# Patient Record
Sex: Male | Born: 1945 | Race: White | Hispanic: No | Marital: Married | Smoking: Never smoker
Health system: Southern US, Community
[De-identification: ages and names within clinical notes are randomized; demographics above are authoritative.]

## PROBLEM LIST (undated history)

## (undated) DIAGNOSIS — N289 Disorder of kidney and ureter, unspecified: Secondary | ICD-10-CM

## (undated) DIAGNOSIS — I10 Essential (primary) hypertension: Secondary | ICD-10-CM

## (undated) DIAGNOSIS — L97509 Non-pressure chronic ulcer of other part of unspecified foot with unspecified severity: Secondary | ICD-10-CM

## (undated) DIAGNOSIS — I739 Peripheral vascular disease, unspecified: Secondary | ICD-10-CM

## (undated) DIAGNOSIS — E13621 Other specified diabetes mellitus with foot ulcer: Secondary | ICD-10-CM

## (undated) DIAGNOSIS — I639 Cerebral infarction, unspecified: Secondary | ICD-10-CM

## (undated) DIAGNOSIS — E781 Pure hyperglyceridemia: Secondary | ICD-10-CM

## (undated) DIAGNOSIS — I82409 Acute embolism and thrombosis of unspecified deep veins of unspecified lower extremity: Secondary | ICD-10-CM

## (undated) HISTORY — PX: TOE AMPUTATION: SHX809

## (undated) HISTORY — DX: Non-pressure chronic ulcer of other part of unspecified foot with unspecified severity: L97.509

## (undated) HISTORY — DX: Non-pressure chronic ulcer of other part of unspecified foot with unspecified severity: E13.621

## (undated) HISTORY — PX: HERNIA REPAIR: SHX51

## (undated) HISTORY — DX: Peripheral vascular disease, unspecified: I73.9

## (undated) HISTORY — DX: Acute embolism and thrombosis of unspecified deep veins of unspecified lower extremity: I82.409

## (undated) HISTORY — PX: ENDARTERECTOMY: SHX5162

## (undated) HISTORY — PX: TONSILLECTOMY: SUR1361

---

## 2005-12-17 ENCOUNTER — Inpatient Hospital Stay (HOSPITAL_COMMUNITY)
Admission: RE | Admit: 2005-12-17 | Discharge: 2006-01-06 | Payer: Self-pay | Admitting: Physical Medicine & Rehabilitation

## 2005-12-17 ENCOUNTER — Ambulatory Visit: Payer: Self-pay | Admitting: Physical Medicine & Rehabilitation

## 2006-06-14 DIAGNOSIS — I639 Cerebral infarction, unspecified: Secondary | ICD-10-CM

## 2006-06-14 HISTORY — DX: Cerebral infarction, unspecified: I63.9

## 2007-06-15 HISTORY — PX: COLONOSCOPY: SHX174

## 2011-02-23 ENCOUNTER — Encounter: Payer: Self-pay | Admitting: *Deleted

## 2011-02-23 ENCOUNTER — Observation Stay (HOSPITAL_COMMUNITY)
Admission: EM | Admit: 2011-02-23 | Discharge: 2011-02-25 | DRG: 832 | Disposition: A | Discharge status: Home Health | Payer: BC Managed Care – PPO | Attending: Internal Medicine | Admitting: Internal Medicine

## 2011-02-23 ENCOUNTER — Inpatient Hospital Stay (HOSPITAL_COMMUNITY): Payer: BC Managed Care – PPO

## 2011-02-23 ENCOUNTER — Emergency Department (HOSPITAL_COMMUNITY): Payer: BC Managed Care – PPO

## 2011-02-23 ENCOUNTER — Other Ambulatory Visit: Payer: Self-pay

## 2011-02-23 DIAGNOSIS — IMO0001 Reserved for inherently not codable concepts without codable children: Secondary | ICD-10-CM | POA: Diagnosis present

## 2011-02-23 DIAGNOSIS — G459 Transient cerebral ischemic attack, unspecified: Principal | ICD-10-CM | POA: Diagnosis present

## 2011-02-23 DIAGNOSIS — L02419 Cutaneous abscess of limb, unspecified: Secondary | ICD-10-CM | POA: Diagnosis present

## 2011-02-23 DIAGNOSIS — E119 Type 2 diabetes mellitus without complications: Secondary | ICD-10-CM | POA: Diagnosis present

## 2011-02-23 DIAGNOSIS — L0390 Cellulitis, unspecified: Secondary | ICD-10-CM | POA: Diagnosis present

## 2011-02-23 DIAGNOSIS — E785 Hyperlipidemia, unspecified: Secondary | ICD-10-CM | POA: Diagnosis present

## 2011-02-23 DIAGNOSIS — R29898 Other symptoms and signs involving the musculoskeletal system: Secondary | ICD-10-CM | POA: Insufficient documentation

## 2011-02-23 DIAGNOSIS — L03119 Cellulitis of unspecified part of limb: Secondary | ICD-10-CM | POA: Diagnosis present

## 2011-02-23 DIAGNOSIS — E1159 Type 2 diabetes mellitus with other circulatory complications: Secondary | ICD-10-CM | POA: Diagnosis present

## 2011-02-23 DIAGNOSIS — I69959 Hemiplegia and hemiparesis following unspecified cerebrovascular disease affecting unspecified side: Secondary | ICD-10-CM

## 2011-02-23 DIAGNOSIS — I10 Essential (primary) hypertension: Secondary | ICD-10-CM

## 2011-02-23 DIAGNOSIS — I639 Cerebral infarction, unspecified: Secondary | ICD-10-CM | POA: Diagnosis present

## 2011-02-23 HISTORY — DX: Essential (primary) hypertension: I10

## 2011-02-23 HISTORY — DX: Cerebral infarction, unspecified: I63.9

## 2011-02-23 HISTORY — DX: Pure hyperglyceridemia: E78.1

## 2011-02-23 HISTORY — DX: Disorder of kidney and ureter, unspecified: N28.9

## 2011-02-23 LAB — DIFFERENTIAL: Monocytes Absolute: 1.5 10*3/uL — ABNORMAL HIGH (ref 0.1–1.0)

## 2011-02-23 LAB — GLUCOSE, CAPILLARY: Glucose-Capillary: 252 mg/dL — ABNORMAL HIGH (ref 70–99)

## 2011-02-23 LAB — CBC
Hemoglobin: 15 g/dL (ref 13.0–17.0)
MCH: 29.8 pg (ref 26.0–34.0)
MCV: 85.7 fL (ref 78.0–100.0)

## 2011-02-23 LAB — BASIC METABOLIC PANEL
Calcium: 9.9 mg/dL (ref 8.4–10.5)
Creatinine, Ser: 0.87 mg/dL (ref 0.50–1.35)

## 2011-02-23 MED ORDER — ONDANSETRON HCL 4 MG/2ML IJ SOLN: 4.0000 mg | Freq: Four times a day (QID) | INTRAMUSCULAR | Status: DC | PRN

## 2011-02-23 MED ORDER — POTASSIUM CHLORIDE IN NACL 20-0.9 MEQ/L-% IV SOLN
INTRAVENOUS | Status: DC
  Administered 2011-02-23: 19:00:00 via INTRAVENOUS

## 2011-02-23 MED ORDER — LOSARTAN POTASSIUM 50 MG PO TABS
25.0000 mg | ORAL_TABLET | Freq: Every day | ORAL | Status: DC
  Administered 2011-02-23 – 2011-02-25 (×3): 25 mg via ORAL
  Filled 2011-02-23: qty 1
  Filled 2011-02-23: qty 2
  Filled 2011-02-23: qty 1

## 2011-02-23 MED ORDER — GLIPIZIDE 5 MG PO TABS
20.0000 mg | ORAL_TABLET | Freq: Two times a day (BID) | ORAL | Status: DC
  Administered 2011-02-24 – 2011-02-25 (×3): 20 mg via ORAL
  Filled 2011-02-23 (×3): qty 4

## 2011-02-23 MED ORDER — GEMFIBROZIL 600 MG PO TABS
600.0000 mg | ORAL_TABLET | Freq: Two times a day (BID) | ORAL | Status: DC
  Administered 2011-02-24 – 2011-02-25 (×3): 600 mg via ORAL
  Filled 2011-02-23 (×3): qty 1

## 2011-02-23 MED ORDER — INSULIN ASPART 100 UNIT/ML ~~LOC~~ SOLN
0.0000 [IU] | Freq: Three times a day (TID) | SUBCUTANEOUS | Status: DC
  Administered 2011-02-24: 3 [IU] via SUBCUTANEOUS
  Filled 2011-02-23: qty 10

## 2011-02-23 MED ORDER — SODIUM CHLORIDE 0.9 % IJ SOLN
10.0000 mL | Freq: Once | INTRAMUSCULAR | Status: AC
  Administered 2011-02-23: 10 mL via INTRAVENOUS

## 2011-02-23 MED ORDER — INSULIN ASPART 100 UNIT/ML ~~LOC~~ SOLN
0.0000 [IU] | Freq: Every day | SUBCUTANEOUS | Status: DC
  Administered 2011-02-23: 3 [IU] via SUBCUTANEOUS
  Filled 2011-02-23: qty 10

## 2011-02-23 MED ORDER — SENNOSIDES-DOCUSATE SODIUM 8.6-50 MG PO TABS: 1.0000 | ORAL_TABLET | Freq: Every day | ORAL | Status: DC | PRN

## 2011-02-23 MED ORDER — ASPIRIN-DIPYRIDAMOLE ER 25-200 MG PO CP12
ORAL_CAPSULE | ORAL | Status: AC
  Filled 2011-02-23: qty 1

## 2011-02-23 MED ORDER — LEVOFLOXACIN 500 MG PO TABS
500.0000 mg | ORAL_TABLET | Freq: Every day | ORAL | Status: DC
  Administered 2011-02-23 – 2011-02-25 (×3): 500 mg via ORAL
  Filled 2011-02-23 (×3): qty 1

## 2011-02-23 MED ORDER — ONDANSETRON HCL 4 MG PO TABS: 4.0000 mg | ORAL_TABLET | Freq: Four times a day (QID) | ORAL | Status: DC | PRN

## 2011-02-23 MED ORDER — TRAZODONE HCL 50 MG PO TABS
25.0000 mg | ORAL_TABLET | Freq: Every evening | ORAL | Status: DC | PRN
  Administered 2011-02-23 – 2011-02-24 (×2): 25 mg via ORAL
  Filled 2011-02-23 (×2): qty 1

## 2011-02-23 MED ORDER — METOPROLOL TARTRATE 50 MG PO TABS
50.0000 mg | ORAL_TABLET | Freq: Two times a day (BID) | ORAL | Status: DC
  Administered 2011-02-23 – 2011-02-25 (×4): 50 mg via ORAL
  Filled 2011-02-23 (×4): qty 1

## 2011-02-23 MED ORDER — OMEGA-3-ACID ETHYL ESTERS 1 G PO CAPS
1.0000 g | ORAL_CAPSULE | Freq: Two times a day (BID) | ORAL | Status: DC
  Administered 2011-02-24 – 2011-02-25 (×3): 1 g via ORAL
  Filled 2011-02-23 (×3): qty 1

## 2011-02-23 MED ORDER — ENOXAPARIN SODIUM 40 MG/0.4ML ~~LOC~~ SOLN
40.0000 mg | SUBCUTANEOUS | Status: DC
  Administered 2011-02-23 – 2011-02-24 (×2): 40 mg via SUBCUTANEOUS
  Filled 2011-02-23 (×2): qty 0.4

## 2011-02-23 MED ORDER — CALCIUM CARBONATE-VITAMIN D 500-200 MG-UNIT PO TABS
1.0000 | ORAL_TABLET | Freq: Every day | ORAL | Status: DC
  Administered 2011-02-23 – 2011-02-25 (×3): 1 via ORAL
  Filled 2011-02-23 (×3): qty 1

## 2011-02-23 MED ORDER — ACETAMINOPHEN 325 MG PO TABS
650.0000 mg | ORAL_TABLET | Freq: Four times a day (QID) | ORAL | Status: DC | PRN
  Administered 2011-02-24: 650 mg via ORAL
  Filled 2011-02-23: qty 2

## 2011-02-23 MED ORDER — LISINOPRIL 10 MG PO TABS
40.0000 mg | ORAL_TABLET | Freq: Every day | ORAL | Status: DC
  Administered 2011-02-23 – 2011-02-25 (×3): 40 mg via ORAL
  Filled 2011-02-23 (×3): qty 4

## 2011-02-23 MED ORDER — ASPIRIN-DIPYRIDAMOLE ER 25-200 MG PO CP12
1.0000 | ORAL_CAPSULE | Freq: Two times a day (BID) | ORAL | Status: DC
  Administered 2011-02-23 – 2011-02-25 (×4): 1 via ORAL
  Filled 2011-02-23 (×7): qty 1

## 2011-02-23 MED ORDER — ACETAMINOPHEN 650 MG RE SUPP: 650.0000 mg | Freq: Four times a day (QID) | RECTAL | Status: DC | PRN

## 2011-02-23 NOTE — H&P (Signed)
Hayden Gera MRN: [id number] DOB/AGE: 1946-05-31 65 y.o.  Admit date: 02/23/2011 Chief Complaint: Left leg and arm weakness. 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 He had a stroke 5 years ago affecting the left side also and has significant residual weakness. He normally walks with a cane but over the last few days has had to use a walker. There's not been any change in his speech or mentation otherwise. He has a history of hypertension, diabetes and hyperlipidemia. He has been evaluated in the emergency room and a CT scan does not show any new stroke. He is now being admitted for evaluation of and management of a new clinical stroke.     Past medical history: 1. Cerebrovascular disease with history of CVA 5 years ago and also a history of TIA 2 years ago. 2. Hypertension. 3. Diabetes of this type II. 4. Hyperlipidemia.  Past surgical history: 1. Tonsillectomy. 2. Hernia repair.        Family history: Noncontributory.    Social history: He has been married for 18 years. He does not smoke and occasionally drinks alcohol. He is currently on disability secondary to his stroke.   Allergies: No Known Allergies  Medications Prior to Admission  Medication Dose Route Frequency Provider Last Rate Last Dose  . sodium chloride 0.9 % injection 10 mL  10 mL Intravenous Once Donnetta Hutching, MD   10 mL at 02/23/11 1411   No current outpatient prescriptions on file as of 02/23/2011.       XLK:GMWNU from the symptoms mentioned above,there are no other symptoms referable to all systems reviewed.  Physical Exam: Blood pressure 142/76, pulse 92, temperature 98.1 F (36.7 C), temperature source Oral, resp. rate 20, height 5\' 10"  (1.778 m), weight 81.647 kg (180 lb), SpO2 98.00%.  he looks systemically well. He is alert and orientated. He does have left-sided weakness and the power in his left leg is 3/5 and in the left arm  2/5. Heart  sounds are present and normal. There are no murmurs. There are no carotid bruits. Lung fields are clear. Abdomen is soft and nontender with no masses and no hepatosplenomegaly. Skin: There is a wound around the left ankle with surrounding cellulitis.   Basename 02/23/11 1400  WBC 10.9*  NEUTROABS 7.6  HGB 15.0  HCT 43.2  MCV 85.7  PLT 308    Basename 02/23/11 1400  NA 130*  K 4.0  CL 95*  CO2 24  GLUCOSE 354*  BUN 19  CREATININE 0.87  CALCIUM 9.9  MG --  PHOS --   No results found for this or any previous visit (from the past 240 hour(s)).  Ct Head Wo Contrast  02/23/2011  *RADIOLOGY REPORT*  Clinical Data: Left-sided weakness.  CT HEAD WITHOUT CONTRAST  Technique:  Contiguous axial images were obtained from the base of the skull through the vertex without contrast.  Comparison: MRI brain 12/17/2005.  Findings: The ventricles are normal.  No extra-axial fluid collections.  No CT findings for acute hemispheric infarction and/or intracranial hemorrhage.  No mass lesions.  The brainstem and cerebellum appear normal and stable.  The calvarium is intact.  The paranasal sinuses and mastoid air cells are grossly clear. Vascular calcifications are noted.  IMPRESSION: No acute intracranial findings or mass lesions.  Original Report Authenticated By: P. Loralie Champagne, M.D.   Impression:  1. New right brain CVA. 2. Left ankle wound with surrounding cellulitis. 3.  Hypertension. 4. Type 2 diabetes mellitus. 5. Hyperlipidemia.      Plan:  1. Admit. 2. MRI brain. Neurology consultation. 3. Bilateral carotid Dopplers. 4. Echocardiogram. 5. Antibiotics for left ankle cellulitis. 6. Wound care consult for left ankle wound. 7. Physical therapy. 8. Monitor and control diabetes. 9. Monitor and control hypertension. Further recommendations will depend on patient's hospital progress.      Abu Heavin C 02/23/2011, 5:29 PM

## 2011-02-23 NOTE — ED Notes (Signed)
Pt has hx of stroke with left sided weakness - pt states noticed increased weakness x 4 days ago and did not want to come to ER.  States stayed in bed for 3 days and decided to come to ED today because left sided weakness is worse.  States normally walks with a cane and is unable to walk at all.  Speech with studder from previous stroke but states is worse since Friday.

## 2011-02-23 NOTE — ED Notes (Signed)
Dr. Adriana Simas notified of pt's arrival and sx.

## 2011-02-23 NOTE — ED Provider Notes (Addendum)
History     CSN: [id number] Arrival date & time: 02/23/2011  1:40 PM  Chief Complaint  Patient presents with  . Extremity Weakness   Patient is a 65 y.o. male presenting with extremity weakness.  Extremity Weakness   complains of left-sided weakness. Status post CVA approximately 5 years ago which affected his left side. Also had TIA approximately 2 years ago. He has had trouble standing and walking. Previously he had been walking with his walker. Level V caveat for urgent need for intervention.  Past Medical History  Diagnosis Date  . Stroke   . Hypertension   . Diabetes mellitus   . Renal disorder   . High triglycerides     Past Surgical History  Procedure Date  . Hernia repair   . Tonsillectomy     No family history on file.  History  Substance Use Topics  . Smoking status: Never Smoker   . Smokeless tobacco: Not on file  . Alcohol Use: No      Review of Systems  Unable to perform ROS: Other  Musculoskeletal: Positive for extremity weakness.    Physical Exam  BP 142/76  Pulse 92  Temp(Src) 98.1 F (36.7 C) (Oral)  Resp 20  Ht 5\' 10"  (1.778 m)  Wt 180 lb (81.647 kg)  BMI 25.83 kg/m2  SpO2 98%  Physical Exam  Nursing note and vitals reviewed. Constitutional: He is oriented to person, place, and time. He appears well-developed and well-nourished.  HENT:  Head: Normocephalic and atraumatic.  Eyes: Conjunctivae and EOM are normal. Pupils are equal, round, and reactive to light.  Neck: Normal range of motion. Neck supple.  Cardiovascular: Normal rate and regular rhythm.   Pulmonary/Chest: Effort normal and breath sounds normal.  Abdominal: Soft. Bowel sounds are normal.  Musculoskeletal: Normal range of motion.  Neurological: He is alert and oriented to person, place, and time.       Obvious left-sided weakness.  Skin: Skin is warm and dry.       Ulcer on medial aspect of left ankle.  Psychiatric: He has a normal mood and affect.    ED Course    Procedures  MDM History suggestive of new stroke. Patient unable to ambulate. This is new. Although CT scan negative for acute event, suspect cerebral vascular accident. Will admit      Donnetta Hutching, MD 02/23/11 1613  Donnetta Hutching, MD 02/23/11 4098  Donnetta Hutching, MD 02/23/11 1710

## 2011-02-24 ENCOUNTER — Inpatient Hospital Stay (HOSPITAL_COMMUNITY): Payer: BC Managed Care – PPO

## 2011-02-24 DIAGNOSIS — I517 Cardiomegaly: Secondary | ICD-10-CM

## 2011-02-24 LAB — GLUCOSE, CAPILLARY
Glucose-Capillary: 195 mg/dL — ABNORMAL HIGH (ref 70–99)
Glucose-Capillary: 216 mg/dL — ABNORMAL HIGH (ref 70–99)
Glucose-Capillary: 193 mg/dL — ABNORMAL HIGH (ref 70–99)
Glucose-Capillary: 146 mg/dL — ABNORMAL HIGH (ref 70–99)

## 2011-02-24 LAB — CBC
HCT: 43.1 % (ref 39.0–52.0)
Hemoglobin: 14.8 g/dL (ref 13.0–17.0)
MCHC: 34.3 g/dL (ref 30.0–36.0)
RBC: 5.04 MIL/uL (ref 4.22–5.81)
WBC: 11.8 10*3/uL — ABNORMAL HIGH (ref 4.0–10.5)

## 2011-02-24 LAB — COMPREHENSIVE METABOLIC PANEL
BUN: 18 mg/dL (ref 6–23)
CO2: 25 mEq/L (ref 19–32)
Calcium: 10.4 mg/dL (ref 8.4–10.5)
Creatinine, Ser: 0.75 mg/dL (ref 0.50–1.35)
GFR calc Af Amer: >60 mL/min (ref >60)
GFR calc non Af Amer: >60 mL/min (ref >60)
Glucose, Bld: 210 mg/dL — ABNORMAL HIGH (ref 70–99)
Total Protein: 7.6 g/dL (ref 6.0–8.3)

## 2011-02-24 MED ORDER — LORAZEPAM 1 MG PO TABS
1.0000 mg | ORAL_TABLET | Freq: Once | ORAL | Status: AC
  Administered 2011-02-24: 1 mg via ORAL
  Filled 2011-02-24: qty 1

## 2011-02-24 MED ORDER — OXYCODONE HCL 5 MG PO TABS
5.0000 mg | ORAL_TABLET | ORAL | Status: DC | PRN
  Administered 2011-02-24 – 2011-02-25 (×5): 5 mg via ORAL
  Filled 2011-02-24 (×5): qty 1

## 2011-02-24 MED ORDER — INSULIN ASPART 100 UNIT/ML ~~LOC~~ SOLN
0.0000 [IU] | Freq: Three times a day (TID) | SUBCUTANEOUS | Status: DC
  Administered 2011-02-24: 7 [IU] via SUBCUTANEOUS
  Administered 2011-02-24 – 2011-02-25 (×2): 4 [IU] via SUBCUTANEOUS

## 2011-02-24 MED ORDER — METFORMIN HCL 500 MG PO TABS
500.0000 mg | ORAL_TABLET | Freq: Two times a day (BID) | ORAL | Status: DC
  Administered 2011-02-24 – 2011-02-25 (×3): 500 mg via ORAL
  Filled 2011-02-24 (×3): qty 1

## 2011-02-24 NOTE — Progress Notes (Signed)
UR Chart Review Completed  

## 2011-02-24 NOTE — Progress Notes (Signed)
Subjective: Feels better today, with marginal improvement in LUE and LLE strength. No new issues, otherwise/. Ate a good breakfast. Objective: Vital signs in last 24 hours: Temp:  [97.5 F (36.4 C)-98.1 F (36.7 C)] 97.6 F (36.4 C) (09/12 0535) Pulse Rate:  [77-102] 77  (09/12 0535) Resp:  [16-24] 16  (09/12 0535) BP: (111-175)/(69-94) 124/83 mmHg (09/12 0535) SpO2:  [94 %-98 %] 94 % (09/12 0535) Weight:  [180 lb (81.647 kg)] 180 lb (81.647 kg) (09/11 1345) Weight change:  Last BM Date: 02/21/11  Intake/Output from previous day: 09/11 0701 - 09/12 0700 In: 378 [I.V.:378] Out: 525 [Urine:525]     Physical Exam: General: Alert, awake, oriented x3, in no acute distress. HEENT: No bruits, no goiter. Heart: Regular rhythm, without murmurs. Lungs: Clear to auscultation bilaterally. Abdomen: Soft, nontender, nondistended, positive bowel sounds. Extremities: No clubbing cyanosis or edema with positive pedal pulses. Neuro: Left hemiparesis. LLE: 4 cm x 4.5 cm wound Lt malleolar region, with perifocal erythema. Appears improved overnight, without overt purulence.    Lab Results:  Basename 02/24/11 0426 02/23/11 1400  WBC 11.8* 10.9*  HGB 14.8 15.0  HCT 43.1 43.2  PLT 324 308    Basename 02/24/11 0426 02/23/11 1400  NA 135 130*  K 4.1 4.0  CL 99 95*  CO2 25 24  GLUCOSE 210* 354*  BUN 18 19  CREATININE 0.75 0.87  CALCIUM 10.4 9.9   No results found for this or any previous visit (from the past 240 hour(s)).   Studies/Results: Ct Head Wo Contrast  02/23/2011  *RADIOLOGY REPORT*  Clinical Data: Left-sided weakness.  CT HEAD WITHOUT CONTRAST  Technique:  Contiguous axial images were obtained from the base of the skull through the vertex without contrast.  Comparison: MRI brain 12/17/2005.  Findings: The ventricles are normal.  No extra-axial fluid collections.  No CT findings for acute hemispheric infarction and/or intracranial hemorrhage.  No mass lesions.  The  brainstem and cerebellum appear normal and stable.  The calvarium is intact.  The paranasal sinuses and mastoid air cells are grossly clear. Vascular calcifications are noted.  IMPRESSION: No acute intracranial findings or mass lesions.  Original Report Authenticated By: P. Loralie Champagne, M.D.    Medications: Scheduled Meds:   . calcium-vitamin D  1 tablet Oral Daily  . dipyridamole-aspirin  1 capsule Oral BID  . enoxaparin  40 mg Subcutaneous Q24H  . gemfibrozil  600 mg Oral BID AC  . glipiZIDE  20 mg Oral BID AC  . insulin aspart  0-15 Units Subcutaneous TID WC  . insulin aspart  0-5 Units Subcutaneous QHS  . levofloxacin  500 mg Oral Daily  . lisinopril  40 mg Oral Daily  . LORazepam  1 mg Oral Once  . losartan  25 mg Oral Daily  . metoprolol  50 mg Oral BID  . omega-3 acid ethyl esters  1 g Oral BID  . sodium chloride  10 mL Intravenous Once   Continuous Infusions:   . DISCONTD: 0.9 % NaCl with KCl 20 mEq / L 1,000 mL infusion 50 mL/hr at 02/23/11 1900   PRN Meds:.acetaminophen, acetaminophen, ondansetron (ZOFRAN) IV, ondansetron, oxyCODONE, senna-docusate, traZODone  Assessment/Plan:  Principal Problem:  *CVA (cerebral vascular accident): Stable/Cont PT/OT. Active Problems:  DM (diabetes mellitus): Uncontrolled. Cont diet, SSI (Change to resistant scale), and add Metformin 500 mg p.o. B.i.d.  HTN (hypertension): Controlled.  Hyperlipidemia: Check fasting lipids/TSH. Cont current lipid-lowering meds for now.    LOS: 1 day  Wesley Ingram,CHRISTOPHER 02/24/2011, 8:59 AM

## 2011-02-24 NOTE — Progress Notes (Signed)
*  PRELIMINARY RESULTS* Echocardiogram 2D Echocardiogram has been performed.  Conrad [place] 02/24/2011, 11:23 AM

## 2011-02-24 NOTE — Progress Notes (Signed)
Physical Therapy Evaluation Patient Name: Suleyman Ehrman [id number] Date: 02/24/2011 Problem List:  Patient Active Problem List  Diagnoses  . CVA (cerebral vascular accident)  . DM (diabetes mellitus)  . HTN (hypertension)  . Hyperlipidemia   Past Medical History:  Past Medical History  Diagnosis Date  . Stroke   . Hypertension   . Diabetes mellitus   . Renal disorder   . High triglycerides    Past Surgical History:  Past Surgical History  Procedure Date  . Hernia repair   . Tonsillectomy     Precautions/Restrictions  Precautions Precautions: Fall Required Braces or Orthoses: No Restrictions Weight Bearing Restrictions: No Prior Functioning  Home Living Type of Home: House Lives With: Spouse Receives Help From: Family Home Layout: One level Home Access: Stairs to enter Entrance Stairs-Rails: Right Entrance Stairs-Number of Steps: 2-3 Bathroom Shower/Tub: Nurse, adult Accessibility: Yes Home Adaptive Equipment: Other (comment) (cane/hemiwalker.  Pt was having someone come to adapt bathro) Prior Function Level of Independence: Needs assistance with ADLs Driving: No Vocation: Retired Financial risk analyst Arousal/Alertness: Awake/alert Overall Cognitive Status: Appears within functional limits for tasks assessed Orientation Level: Oriented X4 Sensation/Coordination   Extremity Assessment RUE Assessment RUE Assessment: Not tested RLE Assessment RLE Assessment: Exceptions to [facility] RLE AROM (degrees) RLE Overall AROM Comments: WFL RLE Strength RLE Overall Strength: Within Functional Limits for tasks assessed LLE Assessment LLE Assessment: Exceptions to [facility] LLE Strength Left Hip Flexion: 2/5 Left Hip Extension: 2/5 Left Hip ABduction: 2/5 Left Hip ADduction: 2/5 Left Knee Flexion: 2-/5 Left Knee Extension: 1/5 Left Ankle Dorsiflexion: 0/5 Left Ankle Plantar Flexion: 0/5 Left Ankle Inversion: 0/5 Left Ankle Eversion: 0/5 LLE Tone LLE Tone:  Severe;Hypertonic Hypertonic Details: pt goes into flexion tone with any stimulation  Mobility (including Balance) Bed Mobility Bed Mobility: Yes Rolling Right: 6: Modified independent (Device/Increase time) Supine to Sit: 3: Mod assist Sitting - Scoot to Edge of Bed: 6: Modified independent (Device/Increase time) Sit to Supine - Right: 4: Min assist Transfers Transfers: Yes Sit to Stand: 3: Mod assist Stand to Sit: 4: Min assist Ambulation/Gait Ambulation/Gait: No (Pt states he is in to much pain to ambulate at this time) Stairs: No Wheelchair Mobility Wheelchair Mobility: No  Posture/Postural Control Posture/Postural Control: Postural limitations Postural Limitations: Pt has significant flexion tone of L LE upon standing tone is enough to keep his L foot off the floor Balance Balance Assessed: No Exercise   none pt unable to tolerate secondary to ankle pain.  End of Session PT - End of Session Equipment Utilized During Treatment: Gait belt Activity Tolerance: Patient limited by pain Patient left: in bed;with call bell in reach [facility] dressed by PT recommend nursing to change dressing qd Behavior During Session: [facility] for tasks performed Cognition: [facility] for tasks performed PT Assessment/Plan/Recommendation PT Assessment Clinical Impression Statement: Pt has decreased mobiltiy.  Pt has new wound 3 wks old 2x4x.3 cm on medial aspect of L ankle.  Pt states tone was not as bad prior to the wound.  Pt states  he wasl able to get around by himself until recently.  Pt will benefirt from skille physcial therapy to improve mobility.  Recommend nursing to dress wound  spraying Carraklenz to wound fb 4x4 and kling. PT Recommendation/Assessment: Patient will need skilled PT in the acute care venue PT Problem List: Decreased strength;Decreased range of motion;Decreased activity tolerance;Impaired tone;Pain;Other (comment) (non-healing wound) PT Therapy Diagnosis : Difficulty  walking;Acute pain;Abnormality of gait PT Plan PT Frequency: Min 6X/week PT Treatment/Interventions:  Gait training;Therapeutic exercise;Functional mobility training PT Recommendation Follow Up Recommendations: Home health PT Equipment Recommended: None recommended by PT (pt has equipment at home) PT Goals  Acute Rehab PT Goals PT Goal Formulation: With patient Time For Goal Achievement: 4 days Pt will Roll Supine to Right Side: with modified independence Pt will go Supine/Side to Sit: with modified independence Pt will go Sit to Supine/Side: with modified independence Pt will Transfer Sit to Stand/Stand to Sit: with modified independence Pt will Transfer Bed to Chair/Chair to Bed: with modified independence Pt will Stand: with modified independence Pt will Ambulate: 16 - 50 feet;with min assist;with other equipment (comment) (hemiwalker) RUSSELL,CINDY 02/24/2011, 10:43 AM

## 2011-02-25 DIAGNOSIS — L0390 Cellulitis, unspecified: Secondary | ICD-10-CM | POA: Diagnosis present

## 2011-02-25 DIAGNOSIS — G459 Transient cerebral ischemic attack, unspecified: Secondary | ICD-10-CM | POA: Diagnosis present

## 2011-02-25 LAB — LIPID PANEL
Cholesterol: 103 mg/dL (ref 0–200)
LDL Cholesterol: 33 mg/dL (ref 0–99)
VLDL: 42 mg/dL — ABNORMAL HIGH (ref 0–40)

## 2011-02-25 LAB — BASIC METABOLIC PANEL
CO2: 26 mEq/L (ref 19–32)
Calcium: 9.8 mg/dL (ref 8.4–10.5)
Creatinine, Ser: 0.94 mg/dL (ref 0.50–1.35)
GFR calc non Af Amer: >60 mL/min (ref >60)

## 2011-02-25 LAB — GLUCOSE, CAPILLARY: Glucose-Capillary: 193 mg/dL — ABNORMAL HIGH (ref 70–99)

## 2011-02-25 MED ORDER — METFORMIN HCL 500 MG PO TABS: 500.0000 mg | ORAL_TABLET | Freq: Two times a day (BID) | ORAL | Status: DC

## 2011-02-25 MED ORDER — DOXYCYCLINE HYCLATE 50 MG PO CAPS: 100.0000 mg | ORAL_CAPSULE | Freq: Two times a day (BID) | ORAL | Status: AC

## 2011-02-25 NOTE — Discharge Summary (Signed)
Patient going home per MD. IV cath intact and removed. No redness or swelling at site. Given prescriptions and discharge instructions. Pt verbally understands instructions.

## 2011-02-25 NOTE — Progress Notes (Signed)
Physical Therapy Treatment Patient Name: Wesley Ingram Today's Date: 02/25/2011 TIME:[phone] CH; 1 TE Problem List:  Patient Active Problem List  Diagnoses  . CVA (cerebral vascular accident)  . DM (diabetes mellitus)  . HTN (hypertension)  . Hyperlipidemia   Past Medical History:  Past Medical History  Diagnosis Date  . Stroke   . Hypertension   . Diabetes mellitus   . Renal disorder   . High triglycerides    Past Surgical History:  Past Surgical History  Procedure Date  . Hernia repair   . Tonsillectomy    Precautions/Restrictions  Precautions Precautions: Fall Required Braces or Orthoses: No Restrictions Weight Bearing Restrictions: No Mobility (including Balance)      Exercise  Total Joint Exercises Ankle Circles/Pumps: Left;10 reps Long Arc Quad: AAROM;10 reps;Both (LLE AAROM) General Exercises - Lower Extremity Ankle Circles/Pumps: Left;10 reps Long Arc Quad: AAROM;10 reps;Both (LLE AAROM) Toe Raises: Right;10 reps Heel Raises: Right;10 reps Low Level/ICU Exercises Ankle Circles/Pumps: Left;10 reps  End of Session PT - End of Session Equipment Utilized During Treatment: Gait belt (hemi walker) Activity Tolerance: Patient limited by pain;Patient limited by fatigue Patient left: in bed;with call bell in reach (nursing present) General Behavior During Session: [facility] for tasks performed Cognition: [facility] for tasks performed PT Assessment/Plan  PT - Assessment/Plan Comments on Treatment Session: pt attempted to stand x2; not able due to pain of LLE;pt requested to lay back down and d/c therapy;pt is leaving for home today PT Goals  Acute Rehab PT Goals PT Goal: Rolling Supine to Right Side - Progress: Met PT Goal: Supine/Side to Sit - Progress: Met PT Goal: Sit to Supine/Side - Progress: Met  Wesley Ingram 02/25/2011, 10:24 AM

## 2011-02-25 NOTE — Progress Notes (Signed)
Subjective: No new issues. Objective: Vital signs in last 24 hours: Temp:  [97.5 F (36.4 C)-97.8 F (36.6 C)] 97.5 F (36.4 C) (09/13 0545) Pulse Rate:  [77-79] 77  (09/13 0545) Resp:  [20] 20  (09/13 0545) BP: (109-147)/(71-85) 147/85 mmHg (09/13 0545) SpO2:  [96 %-100 %] 96 % (09/13 0545) Weight change:  Last BM Date: 02/21/11  Intake/Output from previous day: 09/12 0701 - 09/13 0700 In: 360 [P.O.:360] Out: 1725 [Urine:1725]     Physical Exam: General: Alert, awake, oriented x3, in no acute distress. HEENT: No pallor, no jaundice, hydration, ok. Heart: Regular, without murmurs. Lungs: Clear to auscultation bilaterally. Abdomen: Soft, nontender, nondistended, positive bowel sounds. Extremities: No clubbing cyanosis or edema with positive pedal pulses. Neuro: No new findingsl.    Lab Results:  Basename 02/24/11 0426 02/23/11 1400  WBC 11.8* 10.9*  HGB 14.8 15.0  HCT 43.1 43.2  PLT 324 308    Basename 02/25/11 0404 02/24/11 0426  NA 136 135  K 4.4 4.1  CL 101 99  CO2 26 25  GLUCOSE 177* 210*  BUN 23 18  CREATININE 0.94 0.75  CALCIUM 9.8 10.4   No results found for this or any previous visit (from the past 240 hour(s)).   Studies/Results: Ct Head Wo Contrast  02/23/2011  *RADIOLOGY REPORT*  Clinical Data: Left-sided weakness.  CT HEAD WITHOUT CONTRAST  Technique:  Contiguous axial images were obtained from the base of the skull through the vertex without contrast.  Comparison: MRI brain 12/17/2005.  Findings: The ventricles are normal.  No extra-axial fluid collections.  No CT findings for acute hemispheric infarction and/or intracranial hemorrhage.  No mass lesions.  The brainstem and cerebellum appear normal and stable.  The calvarium is intact.  The paranasal sinuses and mastoid air cells are grossly clear. Vascular calcifications are noted.  IMPRESSION: No acute intracranial findings or mass lesions.  Original Report Authenticated By: P. Loralie Champagne, M.D.    Mr Brain Wo Contrast  02/24/2011  *RADIOLOGY REPORT*  Clinical Data: Prior infarct 2007.  Now with left-sided weakness.  MRI HEAD WITHOUT CONTRAST  Technique:  Multiplanar, multiecho pulse sequences of the brain and surrounding structures were obtained according to standard protocol without intravenous contrast.  Comparison: 02/23/2019 [facility] CT.  12/17/2005 [facility] MR.  Findings: Motion degraded exam.  No acute infarct.  The previously demonstrated right paracentral medulla infarct is not well appreciated on present exam as an area of encephalomalacia.  Mild to moderate white matter type changes most consistent with result of small vessel disease.  No intracranial hemorrhage.  No intracranial mass lesion detected on this unenhanced exam.  Major intracranial vascular structures appear patent with ectatic vertebral arteries and basilar artery with left vertebral artery dominant in size.  IMPRESSION: Motion degraded exam.  No acute infarct.  Mild to moderate white matter type changes suggestive of result of small vessel disease.  Original Report Authenticated By: Fuller Canada, M.D.   US Carotid Duplex Bilateral  02/24/2011  *RADIOLOGY REPORT*  Clinical Data: Stroke, hypertension, diabetes  BILATERAL CAROTID DUPLEX ULTRASOUND  Technique: Wallace Cullens scale imaging, color Doppler and duplex ultrasound was performed of bilateral carotid and vertebral arteries in the neck.  Comparison:  None  Criteria:  Quantification of carotid stenosis is based on velocity parameters that correlate the residual internal carotid diameter with NASCET-based stenosis levels, using the diameter of the distal internal carotid lumen as the denominator for stenosis measurement.  The following velocity measurements were obtained:  PEAK SYSTOLIC/END DIASTOLIC RIGHT ICA:                        83/21cm/sec CCA:                        84/17cm/sec SYSTOLIC ICA/CCA RATIO:     0.99 DIASTOLIC ICA/CCA RATIO:    1.23  ECA:                        109cm/sec  LEFT ICA:                        68/22cm/sec CCA:                        94/22cm/sec SYSTOLIC ICA/CCA RATIO:     0.72 DIASTOLIC ICA/CCA RATIO:    0.97 ECA:                        97cm/sec  Findings:  RIGHT CAROTID ARTERY: Minimally tortuous right carotid system. Intimal thickening right CCA.  Mild plaque formation right CCA and right carotid bulb extending into proximal right ICA.  Laminar flow by color Doppler imaging.  Minimal spectral broadening right ICA on waveform analysis.  No high velocity jets.  RIGHT VERTEBRAL ARTERY:  Patent, antegrade  LEFT CAROTID ARTERY: Intimal thickening left CCA.  Minimal plaque left CCA and left carotid bulb, extending into proximal left ECA and ICA.  Laminar flow by color Doppler imaging with spectral broadening seen on waveform analysis.  Left ICA is tortuous.  No high velocity jets.  LEFT VERTEBRAL ARTERY:  Patent, antegrade  IMPRESSION: Mild plaque formation bilaterally in mildly tortuous carotid systems.  No evidence of hemodynamically significant stenosis.  Original Report Authenticated By: Lollie Marrow, M.D.    Medications: Scheduled Meds:   . calcium-vitamin D  1 tablet Oral Daily  . dipyridamole-aspirin  1 capsule Oral BID  . enoxaparin  40 mg Subcutaneous Q24H  . gemfibrozil  600 mg Oral BID AC  . glipiZIDE  20 mg Oral BID AC  . insulin aspart  0-20 Units Subcutaneous TID WC  . levofloxacin  500 mg Oral Daily  . lisinopril  40 mg Oral Daily  . losartan  25 mg Oral Daily  . metFORMIN  500 mg Oral BID  . metoprolol  50 mg Oral BID  . omega-3 acid ethyl esters  1 g Oral BID   Continuous Infusions:  PRN Meds:.acetaminophen, acetaminophen, ondansetron (ZOFRAN) IV, ondansetron, oxyCODONE, senna-docusate, traZODone  Assessment/Plan:  Principal Problem:  *CVA (cerebral vascular accident): According to findings on brain MRI, there is no evidence of new CVA. Patient probably had yet another TIA, and has been reassured  accordingly.  Active Problems: DM (diabetes mellitus): Controlled, after addition of Metformin to treatment. HTN (hypertension): Reasonably controlled. Hyperlipidemia: On lipid-lowering medication. Contd. LLE wound: Seen by WOC. Clinically improved. Will allow home on 10 days of Doxycycline.    LOS: 2 days   Disposition: Discharged. HHPT/OT/RN, for wound care and diabetes management, has been arranged.  Enriqueta Augusta,CHRISTOPHER 02/25/2011, 10:48 AM

## 2011-02-25 NOTE — Discharge Summary (Signed)
Physician Discharge Summary  Patient ID: Wesley Ingram MRN: [id number] DOB/AGE: 1945-11-21 65 y.o.  Admit date: 02/23/2011 Discharge date: 02/25/2011  Primary Care Physician:  No primary provider on file. Primary care provider is Dr. Randa Evens at [facility].   Discharge Diagnoses:    Patient Active Problem List  Diagnoses  . TIA  . DM (diabetes mellitus)  . HTN (hypertension)  . Hyperlipidemia    Current Discharge Medication List    START taking these medications   Details  doxycycline (VIBRAMYCIN) 50 MG capsule Take 2 capsules (100 mg total) by mouth 2 (two) times daily. Qty: 20 capsule, Refills: 0    metFORMIN (GLUCOPHAGE) 500 MG tablet Take 1 tablet (500 mg total) by mouth 2 (two) times daily. Qty: 60 tablet, Refills: 0      CONTINUE these medications which have NOT CHANGED   Details  Calcium Carb-Cholecalciferol (CALCIUM 500 +D) 500-400 MG-UNIT TABS Take 1 tablet by mouth daily.      dipyridamole-aspirin (AGGRENOX) 25-200 MG per 12 hr capsule Take 1 capsule by mouth 2 (two) times daily.      gemfibrozil (LOPID) 600 MG tablet Take 600 mg by mouth 2 (two) times daily before a meal.      glipiZIDE (GLUCOTROL) 10 MG tablet Take 20 mg by mouth 2 (two) times daily before a meal.      lisinopril (PRINIVIL,ZESTRIL) 40 MG tablet Take 40 mg by mouth daily.      losartan (COZAAR) 50 MG tablet Take 25 mg by mouth daily.      Melatonin 1 MG TABS Take 3 mg by mouth at bedtime. HOLD WHILE IN HOSPITAL     metoprolol (LOPRESSOR) 50 MG tablet Take 50 mg by mouth 2 (two) times daily.      Omega-3 Fatty Acids (FISH OIL) 1000 MG CAPS Take 1 capsule by mouth daily.           Disposition and Follow-up: Patient was discharged in satisfactory condition. He is to follow up with his primary MD  Dr. Randa Evens, at [facility], within 1-2 weeks. He has been instructed to call for an appointment.   Diet: Heart-Healthy/Carbohydrate-Modified.  Special instructions: HHPT/OT/RN,  for wound care and diabetes management.  Consults:  None.   Significant Diagnostic Studies:  Ct Head Wo Contrast  02/23/2011  *RADIOLOGY REPORT*  Clinical Data: Left-sided weakness.  CT HEAD WITHOUT CONTRAST  Technique:  Contiguous axial images were obtained from the base of the skull through the vertex without contrast.  Comparison: MRI brain 12/17/2005.  Findings: The ventricles are normal.  No extra-axial fluid collections.  No CT findings for acute hemispheric infarction and/or intracranial hemorrhage.  No mass lesions.  The brainstem and cerebellum appear normal and stable.  The calvarium is intact.  The paranasal sinuses and mastoid air cells are grossly clear. Vascular calcifications are noted.  IMPRESSION: No acute intracranial findings or mass lesions.  Original Report Authenticated By: P. Loralie Champagne, M.D.   Mr Brain Wo Contrast  02/24/2011  *RADIOLOGY REPORT*  Clinical Data: Prior infarct 2007.  Now with left-sided weakness.  MRI HEAD WITHOUT CONTRAST  Technique:  Multiplanar, multiecho pulse sequences of the brain and surrounding structures were obtained according to standard protocol without intravenous contrast.  Comparison: 02/23/2019 [facility] CT.  12/17/2005 [facility] MR.  Findings: Motion degraded exam.  No acute infarct.  The previously demonstrated right paracentral medulla infarct is not well appreciated on present exam as an area of encephalomalacia.  Mild to  moderate white matter type changes most consistent with result of small vessel disease.  No intracranial hemorrhage.  No intracranial mass lesion detected on this unenhanced exam.  Major intracranial vascular structures appear patent with ectatic vertebral arteries and basilar artery with left vertebral artery dominant in size.  IMPRESSION: Motion degraded exam.  No acute infarct.  Mild to moderate white matter type changes suggestive of result of small vessel disease.  Original Report Authenticated By: Fuller Canada, M.D.    Brief H and P: For complete details please refer to admission H and P, by Dr. Lilly Cove. In brief, this is a 65 yr old male, with known history of cerebrovascular disease status post CVA 5 years ago,TIA 2 years ago, hypertension, type 2 diabetes, hyperlipidemia, previous tonsillectomy and hernia repair, presenting with left-sided weakness of 3 days duration. He was admitted on suspicion of new CVA, for further evaluation, investigation and management.  Hospital Course:  Principal Problem:  *TIA: Patient has a known history of previous CVA and residual left-sided weakness. He presented with increased weakness of left upper and lower extremities. Aggrenox was continued, w/u, including head Ct scan, brain MRI and vascular dopplers were done, which failed to reveal evidence of new CVA, or indeed any concerning findings. See details in procedure list. Patent has been evaluated by PT/OT, and recommendations for HHPT, made.  1. CVA (cerebral vascular accident): Patient remained stable, with residual left hemiparesis.   2. DM (diabetes mellitus): Patient was euglycemic on carbohydrate-modified diet and oral hypoglycemics. Metformin has been added to his diabetic medication.   3. HTN (hypertension): Patient remained normotensive on ACE-I/ARB.   4. Hyperlipidemia: Patient was continued on preadmission lipid-lowering medication.  5. Left lower extremity wound. Patient had an approximately 4 cm x 4.5 cm wound, on the medial aspect of his left ankle, which did not appear clinically overtly infected, but had perifocal cellulitis. The would care team evaluated him during this hospitalization, and has made recommendations. He will complete a 10-day course of Doxycycline, on discharge.   Time spent on Discharge: 45 mins.  Signed: Sparkle Aube,CHRISTOPHER 02/25/2011, 10:16 AM

## 2011-03-19 NOTE — ED Notes (Signed)
Date: 03/19/2011  Rate: 96  Rhythm: normal sinus rhythm  QRS Axis: normal  Intervals: normal  ST/T Wave abnormalities: normal  Conduction Disutrbances:right bundle branch block  Narrative Interpretation:   Old EKG Reviewed: none available   Donnetta Hutching, MD 03/19/11 2057

## 2016-10-19 ENCOUNTER — Inpatient Hospital Stay (HOSPITAL_COMMUNITY): Payer: BLUE CROSS/BLUE SHIELD

## 2016-10-19 ENCOUNTER — Encounter (HOSPITAL_COMMUNITY)
Admission: EM | Disposition: A | Discharge status: Skilled Nursing Facility | Payer: Self-pay | Source: Home / Self Care | Attending: Internal Medicine

## 2016-10-19 ENCOUNTER — Emergency Department (HOSPITAL_COMMUNITY): Payer: BLUE CROSS/BLUE SHIELD

## 2016-10-19 ENCOUNTER — Inpatient Hospital Stay (HOSPITAL_COMMUNITY): Payer: BLUE CROSS/BLUE SHIELD | Admitting: Certified Registered Nurse Anesthetist

## 2016-10-19 ENCOUNTER — Inpatient Hospital Stay (HOSPITAL_COMMUNITY)
Admission: EM | Admit: 2016-10-19 | Discharge: 2016-10-26 | DRG: 872 | Disposition: A | Discharge status: Skilled Nursing Facility | Payer: BLUE CROSS/BLUE SHIELD | Attending: Internal Medicine | Admitting: Internal Medicine

## 2016-10-19 ENCOUNTER — Encounter (HOSPITAL_COMMUNITY): Payer: Self-pay | Admitting: Cardiology

## 2016-10-19 DIAGNOSIS — N3289 Other specified disorders of bladder: Secondary | ICD-10-CM | POA: Diagnosis present

## 2016-10-19 DIAGNOSIS — N179 Acute kidney failure, unspecified: Secondary | ICD-10-CM | POA: Diagnosis not present

## 2016-10-19 DIAGNOSIS — R112 Nausea with vomiting, unspecified: Secondary | ICD-10-CM

## 2016-10-19 DIAGNOSIS — M899 Disorder of bone, unspecified: Secondary | ICD-10-CM | POA: Diagnosis not present

## 2016-10-19 DIAGNOSIS — N132 Hydronephrosis with renal and ureteral calculous obstruction: Secondary | ICD-10-CM | POA: Diagnosis present

## 2016-10-19 DIAGNOSIS — I152 Hypertension secondary to endocrine disorders: Secondary | ICD-10-CM | POA: Diagnosis present

## 2016-10-19 DIAGNOSIS — Z7984 Long term (current) use of oral hypoglycemic drugs: Secondary | ICD-10-CM | POA: Diagnosis not present

## 2016-10-19 DIAGNOSIS — D72829 Elevated white blood cell count, unspecified: Secondary | ICD-10-CM

## 2016-10-19 DIAGNOSIS — Z807 Family history of other malignant neoplasms of lymphoid, hematopoietic and related tissues: Secondary | ICD-10-CM

## 2016-10-19 DIAGNOSIS — N390 Urinary tract infection, site not specified: Secondary | ICD-10-CM | POA: Diagnosis present

## 2016-10-19 DIAGNOSIS — E1159 Type 2 diabetes mellitus with other circulatory complications: Secondary | ICD-10-CM | POA: Diagnosis present

## 2016-10-19 DIAGNOSIS — E860 Dehydration: Secondary | ICD-10-CM

## 2016-10-19 DIAGNOSIS — C9000 Multiple myeloma not having achieved remission: Secondary | ICD-10-CM | POA: Diagnosis present

## 2016-10-19 DIAGNOSIS — A419 Sepsis, unspecified organism: Secondary | ICD-10-CM

## 2016-10-19 DIAGNOSIS — K567 Ileus, unspecified: Secondary | ICD-10-CM | POA: Diagnosis not present

## 2016-10-19 DIAGNOSIS — Z7901 Long term (current) use of anticoagulants: Secondary | ICD-10-CM

## 2016-10-19 DIAGNOSIS — R1110 Vomiting, unspecified: Secondary | ICD-10-CM

## 2016-10-19 DIAGNOSIS — Z8673 Personal history of transient ischemic attack (TIA), and cerebral infarction without residual deficits: Secondary | ICD-10-CM

## 2016-10-19 DIAGNOSIS — Z66 Do not resuscitate: Secondary | ICD-10-CM | POA: Diagnosis present

## 2016-10-19 DIAGNOSIS — N200 Calculus of kidney: Secondary | ICD-10-CM

## 2016-10-19 DIAGNOSIS — I110 Hypertensive heart disease with heart failure: Secondary | ICD-10-CM | POA: Diagnosis present

## 2016-10-19 DIAGNOSIS — N289 Disorder of kidney and ureter, unspecified: Secondary | ICD-10-CM

## 2016-10-19 DIAGNOSIS — I69354 Hemiplegia and hemiparesis following cerebral infarction affecting left non-dominant side: Secondary | ICD-10-CM

## 2016-10-19 DIAGNOSIS — I10 Essential (primary) hypertension: Secondary | ICD-10-CM

## 2016-10-19 DIAGNOSIS — E785 Hyperlipidemia, unspecified: Secondary | ICD-10-CM | POA: Diagnosis not present

## 2016-10-19 DIAGNOSIS — K529 Noninfective gastroenteritis and colitis, unspecified: Secondary | ICD-10-CM | POA: Diagnosis not present

## 2016-10-19 DIAGNOSIS — Z79899 Other long term (current) drug therapy: Secondary | ICD-10-CM

## 2016-10-19 DIAGNOSIS — E119 Type 2 diabetes mellitus without complications: Secondary | ICD-10-CM | POA: Diagnosis present

## 2016-10-19 DIAGNOSIS — A411 Sepsis due to other specified staphylococcus: Secondary | ICD-10-CM | POA: Diagnosis not present

## 2016-10-19 DIAGNOSIS — E781 Pure hyperglyceridemia: Secondary | ICD-10-CM | POA: Diagnosis not present

## 2016-10-19 HISTORY — PX: CYSTOSCOPY WITH STENT PLACEMENT: SHX5790

## 2016-10-19 LAB — LACTIC ACID, PLASMA
LACTIC ACID, VENOUS: 2.9 mmol/L — AB (ref 0.5–1.9)
Lactic Acid, Venous: 2.4 mmol/L (ref 0.5–1.9)
Lactic Acid, Venous: 1.8 mmol/L (ref 0.5–1.9)
Lactic Acid, Venous: 1.5 mmol/L (ref 0.5–1.9)

## 2016-10-19 LAB — URINALYSIS, ROUTINE W REFLEX MICROSCOPIC
Bilirubin Urine: NEGATIVE
Glucose, UA: 150 mg/dL — AB
Ketones, ur: 5 mg/dL — AB
NITRITE: NEGATIVE
PH: 7 (ref 5.0–8.0)
Protein, ur: 100 mg/dL — AB
SPECIFIC GRAVITY, URINE: 1.021 (ref 1.005–1.030)
Squamous Epithelial / LPF: NONE SEEN

## 2016-10-19 LAB — COMPREHENSIVE METABOLIC PANEL
ALBUMIN: 4.3 g/dL (ref 3.5–5.0)
ALK PHOS: 52 U/L (ref 38–126)
ALT: 17 U/L (ref 17–63)
AST: 25 U/L (ref 15–41)
Anion gap: 13 (ref 5–15)
BILIRUBIN TOTAL: 0.9 mg/dL (ref 0.3–1.2)
BUN: 37 mg/dL — AB (ref 6–20)
CALCIUM: 9.6 mg/dL (ref 8.9–10.3)
CO2: 24 mmol/L (ref 22–32)
CREATININE: 1.89 mg/dL — AB (ref 0.61–1.24)
Chloride: 98 mmol/L — ABNORMAL LOW (ref 101–111)
GFR calc Af Amer: 40 mL/min — ABNORMAL LOW (ref >60)
GFR, EST NON AFRICAN AMERICAN: 35 mL/min — AB (ref >60)
GLUCOSE: 220 mg/dL — AB (ref 65–99)
Potassium: 4.1 mmol/L (ref 3.5–5.1)
Sodium: 135 mmol/L (ref 135–145)
TOTAL PROTEIN: 8.1 g/dL (ref 6.5–8.1)

## 2016-10-19 LAB — GLUCOSE, CAPILLARY
Glucose-Capillary: 167 mg/dL — ABNORMAL HIGH (ref 65–99)
Glucose-Capillary: 177 mg/dL — ABNORMAL HIGH (ref 65–99)

## 2016-10-19 LAB — CBC
HEMATOCRIT: 41.4 % (ref 39.0–52.0)
Hemoglobin: 14.3 g/dL (ref 13.0–17.0)
MCH: 29.9 pg (ref 26.0–34.0)
MCHC: 34.5 g/dL (ref 30.0–36.0)
MCV: 86.4 fL (ref 78.0–100.0)
PLATELETS: 277 10*3/uL (ref 150–400)
RBC: 4.79 MIL/uL (ref 4.22–5.81)
RDW: 13.4 % (ref 11.5–15.5)
WBC: 29.9 10*3/uL — AB (ref 4.0–10.5)

## 2016-10-19 LAB — SURGICAL PCR SCREEN
MRSA, PCR: NEGATIVE
Staphylococcus aureus: NEGATIVE

## 2016-10-19 LAB — PROTIME-INR
INR: 1.39
Prothrombin Time: 17.2 seconds — ABNORMAL HIGH (ref 11.4–15.2)

## 2016-10-19 LAB — PROCALCITONIN: PROCALCITONIN: 0.17 ng/mL

## 2016-10-19 LAB — LIPASE, BLOOD: Lipase: 30 U/L (ref 11–51)

## 2016-10-19 LAB — APTT: APTT: 40 s — AB (ref 24–36)

## 2016-10-19 SURGERY — CYSTOSCOPY, WITH STENT INSERTION
Anesthesia: General | Site: Ureter | Laterality: Right

## 2016-10-19 MED ORDER — SODIUM CHLORIDE 0.9 % IR SOLN
Status: DC | PRN
  Administered 2016-10-19: 3000 mL

## 2016-10-19 MED ORDER — ONDANSETRON HCL 4 MG/2ML IJ SOLN
4.0000 mg | Freq: Four times a day (QID) | INTRAMUSCULAR | Status: DC | PRN
  Administered 2016-10-23: 4 mg via INTRAVENOUS
  Filled 2016-10-19: qty 2

## 2016-10-19 MED ORDER — ORAL CARE MOUTH RINSE
15.0000 mL | Freq: Two times a day (BID) | OROMUCOSAL | Status: DC
  Administered 2016-10-20 – 2016-10-25 (×10): 15 mL via OROMUCOSAL

## 2016-10-19 MED ORDER — LIDOCAINE 2% (20 MG/ML) 5 ML SYRINGE
INTRAMUSCULAR | Status: AC
  Filled 2016-10-19: qty 5

## 2016-10-19 MED ORDER — SODIUM CHLORIDE 0.9 % IV BOLUS (SEPSIS)
1000.0000 mL | Freq: Once | INTRAVENOUS | Status: AC
  Administered 2016-10-19: 1000 mL via INTRAVENOUS

## 2016-10-19 MED ORDER — ACETAMINOPHEN 650 MG RE SUPP: 650.0000 mg | Freq: Four times a day (QID) | RECTAL | Status: DC | PRN

## 2016-10-19 MED ORDER — FENTANYL CITRATE (PF) 100 MCG/2ML IJ SOLN
INTRAMUSCULAR | Status: DC | PRN
  Administered 2016-10-19: 50 ug via INTRAVENOUS

## 2016-10-19 MED ORDER — ONDANSETRON HCL 4 MG/2ML IJ SOLN
4.0000 mg | Freq: Once | INTRAMUSCULAR | Status: AC
  Administered 2016-10-19: 4 mg via INTRAVENOUS
  Filled 2016-10-19: qty 2

## 2016-10-19 MED ORDER — SODIUM CHLORIDE 0.9 % IV SOLN
INTRAVENOUS | Status: DC | PRN
  Administered 2016-10-19: 19 mL

## 2016-10-19 MED ORDER — FENTANYL CITRATE (PF) 100 MCG/2ML IJ SOLN
INTRAMUSCULAR | Status: AC
  Filled 2016-10-19: qty 2

## 2016-10-19 MED ORDER — ACETAMINOPHEN 325 MG PO TABS: 650.0000 mg | ORAL_TABLET | Freq: Four times a day (QID) | ORAL | Status: DC | PRN

## 2016-10-19 MED ORDER — ONDANSETRON HCL 4 MG PO TABS: 4.0000 mg | ORAL_TABLET | Freq: Four times a day (QID) | ORAL | Status: DC | PRN

## 2016-10-19 MED ORDER — ASPIRIN-DIPYRIDAMOLE ER 25-200 MG PO CP12: 1.0000 | ORAL_CAPSULE | Freq: Two times a day (BID) | ORAL | Status: DC

## 2016-10-19 MED ORDER — MELATONIN 1 MG PO TABS: 3.0000 mg | ORAL_TABLET | Freq: Every day | ORAL | Status: DC

## 2016-10-19 MED ORDER — CHLORHEXIDINE GLUCONATE 0.12 % MT SOLN
15.0000 mL | Freq: Two times a day (BID) | OROMUCOSAL | Status: DC
  Administered 2016-10-20 – 2016-10-26 (×11): 15 mL via OROMUCOSAL
  Filled 2016-10-19 (×10): qty 15

## 2016-10-19 MED ORDER — PROMETHAZINE HCL 25 MG/ML IJ SOLN: 12.5000 mg | Freq: Four times a day (QID) | INTRAMUSCULAR | Status: DC | PRN

## 2016-10-19 MED ORDER — SUCCINYLCHOLINE CHLORIDE 200 MG/10ML IV SOSY
PREFILLED_SYRINGE | INTRAVENOUS | Status: AC
  Filled 2016-10-19: qty 10

## 2016-10-19 MED ORDER — PROMETHAZINE HCL 25 MG/ML IJ SOLN: 6.2500 mg | INTRAMUSCULAR | Status: DC | PRN

## 2016-10-19 MED ORDER — APIXABAN 5 MG PO TABS
5.0000 mg | ORAL_TABLET | Freq: Two times a day (BID) | ORAL | Status: DC
  Administered 2016-10-19 – 2016-10-26 (×14): 5 mg via ORAL
  Filled 2016-10-19 (×14): qty 1

## 2016-10-19 MED ORDER — FENTANYL CITRATE (PF) 100 MCG/2ML IJ SOLN
25.0000 ug | INTRAMUSCULAR | Status: DC | PRN
Start: 2016-10-19 — End: 2016-10-20

## 2016-10-19 MED ORDER — ATORVASTATIN CALCIUM 10 MG PO TABS
10.0000 mg | ORAL_TABLET | Freq: Every day | ORAL | Status: DC
  Administered 2016-10-19 – 2016-10-25 (×7): 10 mg via ORAL
  Filled 2016-10-19 (×7): qty 1

## 2016-10-19 MED ORDER — SODIUM CHLORIDE 0.9 % IV SOLN
INTRAVENOUS | Status: DC
  Administered 2016-10-19: 21:00:00 via INTRAVENOUS
  Administered 2016-10-20: 1000 mL via INTRAVENOUS
  Administered 2016-10-20 – 2016-10-21 (×3): via INTRAVENOUS
  Administered 2016-10-22: 50 mL/h via INTRAVENOUS

## 2016-10-19 MED ORDER — ONDANSETRON HCL 4 MG/2ML IJ SOLN
INTRAMUSCULAR | Status: DC | PRN
  Administered 2016-10-19: 4 mg via INTRAVENOUS

## 2016-10-19 MED ORDER — SUCCINYLCHOLINE CHLORIDE 200 MG/10ML IV SOSY
PREFILLED_SYRINGE | INTRAVENOUS | Status: DC | PRN
  Administered 2016-10-19: 80 mg via INTRAVENOUS

## 2016-10-19 MED ORDER — GABAPENTIN 100 MG PO CAPS
200.0000 mg | ORAL_CAPSULE | ORAL | Status: DC
  Administered 2016-10-20 – 2016-10-26 (×7): 200 mg via ORAL
  Filled 2016-10-19 (×7): qty 2

## 2016-10-19 MED ORDER — PHENYLEPHRINE 40 MCG/ML (10ML) SYRINGE FOR IV PUSH (FOR BLOOD PRESSURE SUPPORT)
PREFILLED_SYRINGE | INTRAVENOUS | Status: DC | PRN
  Administered 2016-10-19: 80 ug via INTRAVENOUS
  Administered 2016-10-19: 120 ug via INTRAVENOUS

## 2016-10-19 MED ORDER — METOPROLOL SUCCINATE ER 25 MG PO TB24
12.5000 mg | ORAL_TABLET | Freq: Every day | ORAL | Status: DC
  Administered 2016-10-20 – 2016-10-26 (×7): 12.5 mg via ORAL
  Filled 2016-10-19 (×8): qty 1

## 2016-10-19 MED ORDER — LIDOCAINE 2% (20 MG/ML) 5 ML SYRINGE
INTRAMUSCULAR | Status: DC | PRN
  Administered 2016-10-19: 60 mg via INTRAVENOUS

## 2016-10-19 MED ORDER — INSULIN ASPART 100 UNIT/ML ~~LOC~~ SOLN
0.0000 [IU] | Freq: Three times a day (TID) | SUBCUTANEOUS | Status: DC
  Administered 2016-10-20: 2 [IU] via SUBCUTANEOUS
  Administered 2016-10-20 – 2016-10-21 (×2): 3 [IU] via SUBCUTANEOUS
  Administered 2016-10-21: 5 [IU] via SUBCUTANEOUS
  Administered 2016-10-21 – 2016-10-22 (×2): 8 [IU] via SUBCUTANEOUS
  Administered 2016-10-22: 5 [IU] via SUBCUTANEOUS
  Administered 2016-10-22: 8 [IU] via SUBCUTANEOUS
  Administered 2016-10-23: 3 [IU] via SUBCUTANEOUS
  Administered 2016-10-23 (×2): 5 [IU] via SUBCUTANEOUS
  Administered 2016-10-24: 3 [IU] via SUBCUTANEOUS
  Administered 2016-10-24 (×2): 8 [IU] via SUBCUTANEOUS
  Administered 2016-10-25: 3 [IU] via SUBCUTANEOUS
  Administered 2016-10-25: 5 [IU] via SUBCUTANEOUS
  Administered 2016-10-25: 3 [IU] via SUBCUTANEOUS
  Administered 2016-10-26: 5 [IU] via SUBCUTANEOUS
  Administered 2016-10-26: 3 [IU] via SUBCUTANEOUS

## 2016-10-19 MED ORDER — PROPOFOL 10 MG/ML IV BOLUS
INTRAVENOUS | Status: DC | PRN
  Administered 2016-10-19: 90 mg via INTRAVENOUS

## 2016-10-19 MED ORDER — OMEGA-3-ACID ETHYL ESTERS 1 G PO CAPS
1.0000 g | ORAL_CAPSULE | Freq: Every day | ORAL | Status: DC
  Administered 2016-10-20 – 2016-10-26 (×7): 1 g via ORAL
  Filled 2016-10-19 (×7): qty 1

## 2016-10-19 MED ORDER — DEXTROSE 5 % IV SOLN
1.0000 g | INTRAVENOUS | Status: DC
  Administered 2016-10-19 – 2016-10-20 (×2): 1 g via INTRAVENOUS
  Filled 2016-10-19 (×2): qty 10

## 2016-10-19 MED ORDER — ONDANSETRON HCL 4 MG/2ML IJ SOLN
INTRAMUSCULAR | Status: AC
  Filled 2016-10-19: qty 2

## 2016-10-19 MED ORDER — CEFTRIAXONE SODIUM 2 G IJ SOLR: 2.0000 g | Freq: Once | INTRAMUSCULAR | Status: DC

## 2016-10-19 MED ORDER — PROPOFOL 10 MG/ML IV BOLUS
INTRAVENOUS | Status: AC
  Filled 2016-10-19: qty 20

## 2016-10-19 MED ORDER — INSULIN ASPART 100 UNIT/ML ~~LOC~~ SOLN
0.0000 [IU] | Freq: Every day | SUBCUTANEOUS | Status: DC
  Administered 2016-10-21: 3 [IU] via SUBCUTANEOUS
  Administered 2016-10-22: 4 [IU] via SUBCUTANEOUS
  Administered 2016-10-23: 3 [IU] via SUBCUTANEOUS
  Administered 2016-10-24 – 2016-10-25 (×2): 2 [IU] via SUBCUTANEOUS

## 2016-10-19 MED ORDER — IOPAMIDOL (ISOVUE-300) INJECTION 61%
75.0000 mL | Freq: Once | INTRAVENOUS | Status: AC | PRN
  Administered 2016-10-19: 75 mL via INTRAVENOUS

## 2016-10-19 MED ORDER — BACLOFEN 10 MG PO TABS
10.0000 mg | ORAL_TABLET | Freq: Every day | ORAL | Status: DC
  Administered 2016-10-19 – 2016-10-21 (×3): 10 mg via ORAL
  Filled 2016-10-19 (×4): qty 1

## 2016-10-19 SURGICAL SUPPLY — 15 items
BAG URO CATCHER STRL LF (MISCELLANEOUS) ×3 IMPLANT
BASKET ZERO TIP NITINOL 2.4FR (BASKET) IMPLANT
BSKT STON RTRVL ZERO TP 2.4FR (BASKET)
CATH INTERMIT  6FR 70CM (CATHETERS) IMPLANT
CLOTH BEACON ORANGE TIMEOUT ST (SAFETY) ×3 IMPLANT
COVER SURGICAL LIGHT HANDLE (MISCELLANEOUS) ×3 IMPLANT
GLOVE BIOGEL M STRL SZ7.5 (GLOVE) ×3 IMPLANT
GOWN STRL REUS W/TWL LRG LVL3 (GOWN DISPOSABLE) ×6 IMPLANT
GUIDEWIRE ANG ZIPWIRE 038X150 (WIRE) IMPLANT
GUIDEWIRE STR DUAL SENSOR (WIRE) ×3 IMPLANT
MANIFOLD NEPTUNE II (INSTRUMENTS) ×3 IMPLANT
PACK CYSTO (CUSTOM PROCEDURE TRAY) ×3 IMPLANT
STENT URET 6FRX26 CONTOUR (STENTS) ×2 IMPLANT
TUBING CONNECTING 10 (TUBING) ×2 IMPLANT
TUBING CONNECTING 10' (TUBING) ×1

## 2016-10-19 NOTE — Progress Notes (Signed)
PHARMACIST - PHYSICIAN ORDER COMMUNICATION  CONCERNING: P&T Medication Policy on Herbal Medications  DESCRIPTION:  This patient's order for:  Melatonin  has been noted.  This product(s) is classified as an "herbal" or natural product. Due to a lack of definitive safety studies or FDA approval, nonstandard manufacturing practices, plus the potential risk of unknown drug-drug interactions while on inpatient medications, the Pharmacy and Therapeutics Committee does not permit the use of "herbal" or natural products of this type within [facility].   ACTION TAKEN: The pharmacy department is unable to verify this order at this time and your patient has been informed of this safety policy. Please reevaluate patient's clinical condition at discharge and address if the herbal or natural product(s) should be resumed at that time.  Netta Cedars, PharmD, BCPS Pager: [phone] 10/19/2016@7 :54 PM

## 2016-10-19 NOTE — ED Notes (Signed)
Date and time results received: 10/19/16 1903 (use smartphrase ".now" to insert current time)  Test: Lactic acid Critical Value: 2.4  Name of Provider Notified: Dr. Roderic Palau  Orders Received? Or Actions Taken?: MD notified.

## 2016-10-19 NOTE — Anesthesia Procedure Notes (Signed)
Procedure Name: Intubation Date/Time: 10/19/2016 10:56 PM Performed by: Montel Clock Pre-anesthesia Checklist: Patient identified, Emergency Drugs available, Suction available, Patient being monitored and Timeout performed Patient Re-evaluated:Patient Re-evaluated prior to inductionOxygen Delivery Method: Circle system utilized Preoxygenation: Pre-oxygenation with 100% oxygen Intubation Type: IV induction, Rapid sequence and Cricoid Pressure applied Laryngoscope Size: Mac and 3 Grade View: Grade I Tube type: Oral Tube size: 7.5 mm Number of attempts: 1 Airway Equipment and Method: Stylet Placement Confirmation: ETT inserted through vocal cords under direct vision,  positive ETCO2 and breath sounds checked- equal and bilateral Secured at: 23 cm Tube secured with: Tape Dental Injury: Teeth and Oropharynx as per pre-operative assessment

## 2016-10-19 NOTE — ED Provider Notes (Signed)
Medical screening examination/treatment/procedure(s) were conducted as a shared visit with non-physician practitioner(s) and myself.  I personally evaluated the patient during the encounter.  71 year old male with a history of stroke and multiple other comorbidities came in today for approximately 12-15 episodes of vomiting the last 36 hours. No bowel movements during that time either. Has had a lot of burping as well. My examination patient abdomen is benign. His heart rate is in the low 100s but is otherwise vital signs are normal. His labs showed elevated white blood cell count almost 30 along with an elevated lactic acid. We are pending CT scan to evaluate for obstruction.  CT scan shows ileus but no obstruction. Does have a large right kidney stone with a obstruction and hydronephrosis. Also has multiple lytic lesions concerning for multiple myeloma so serum protein electrophoresis was added on.  Secondary to multiple comorbidities and multiple issues requiring multiple specialists patient will be transferred to [facility] for admission and further workup/management.    EKG Interpretation  Date/Time:  Tuesday Oct 19 2016 15:40:40 EDT Ventricular Rate:  88 PR Interval:    QRS Duration: 149 QT Interval:  392 QTC Calculation: 475 R Axis:   -45 Text Interpretation:  Sinus rhythm Ventricular premature complex RBBB and LAFB Baseline wander in lead(s) V6 Technically poor tracing No significant change since last tracing Confirmed by [facility] MD, Corene Cornea [phone]) on 10/19/2016 4:47:51 PM         Davonna Ertl, Corene Cornea, MD 10/19/16 1851

## 2016-10-19 NOTE — ED Provider Notes (Signed)
[place] DEPT Provider Note   CSN: [id number] Arrival date & time: 10/19/16  1403     History   Chief Complaint Chief Complaint  Patient presents with  . Emesis    HPI Wesley Ingram is a 71 y.o. male.  HPI   Wesley Ingram is a 71 y.o. male with PMHx of DM, HTN, CVA (currently taking Eliquis)  who presents to the Emergency Department complaining of diffuse abdominal pain, vomiting that began one day ago.  He also reports a hx of chronic diarrhea for one month.  He was seen at [facility] and advised to take probiotic.  Patient's spouse states that he normally has multiple foul smelling BM's per day and has to take Pepto to control it.  Last BM yesterday.  He reports decreased appetite.  He denies fever, recent antibiotics, bloody or black stools, chest pain or shortness of breath.  No previous colonscopies  Past Medical History:  Diagnosis Date  . Diabetes mellitus   . High triglycerides   . Hypertension   . Renal disorder   . Stroke [facility])     Patient Active Problem List   Diagnosis Date Noted  . TIA (transient ischemic attack) 02/25/2011  . Wound cellulitis 02/25/2011  . CVA (cerebral vascular accident) ([place]) 02/23/2011  . DM (diabetes mellitus) ([place]) 02/23/2011  . HTN (hypertension) 02/23/2011  . Hyperlipidemia 02/23/2011    Past Surgical History:  Procedure Laterality Date  . HERNIA REPAIR    . TOE AMPUTATION    . TONSILLECTOMY         Home Medications    Prior to Admission medications   Medication Sig Start Date End Date Taking? Authorizing Provider  apixaban (ELIQUIS) 5 MG TABS tablet Take 5 mg by mouth 2 (two) times daily.   Yes [provider]  atorvastatin (LIPITOR) 20 MG tablet Take 10 mg by mouth at bedtime.   Yes [provider]  baclofen (LIORESAL) 10 MG tablet Take 10 mg by mouth daily.   Yes [provider]  gabapentin (NEURONTIN) 100 MG capsule Take 200 mg by mouth every morning.   Yes [provider]  glipiZIDE (GLUCOTROL) 5 MG tablet Take 5 mg by mouth 2 (two) times daily before a meal.    Yes [provider]  Lactobacillus (ACIDOPHILUS PO) Take 1 capsule by mouth 2 (two) times daily.   Yes [provider]  losartan (COZAAR) 100 MG tablet Take 50 mg by mouth daily.    Yes [provider]  Melatonin 1 MG TABS Take 3 mg by mouth at bedtime. [place]    Yes [provider]  metFORMIN (GLUCOPHAGE) 1000 MG tablet Take 1,000 mg by mouth 2 (two) times daily with a meal.   Yes [provider]  metoprolol succinate (TOPROL-XL) 12.5 mg TB24 24 hr tablet Take 12.5 mg by mouth daily.   Yes [provider]  dipyridamole-aspirin (AGGRENOX) 25-200 MG per 12 hr capsule Take 1 capsule by mouth 2 (two) times daily.      [provider]  Omega-3 Fatty Acids (FISH OIL) 1000 MG CAPS Take 1 capsule by mouth daily.      [provider]    Family History History reviewed. No pertinent family history.  Social History Social History  Substance Use Topics  . Smoking status: Never Smoker  . Smokeless tobacco: Not on file  . Alcohol use No     Allergies   Patient has no known allergies.  Review of Systems Review of Systems  Constitutional: Negative for appetite change, chills and fever.  Respiratory: Negative for chest tightness and shortness of breath.   Cardiovascular: Negative for chest pain.  Gastrointestinal: Positive for abdominal pain, diarrhea, nausea and vomiting. Negative for blood in stool.  Genitourinary: Negative for decreased urine volume, difficulty urinating, dysuria and flank pain.  Musculoskeletal: Negative for back pain.  Skin: Negative for color change and rash.  Neurological: Negative for dizziness, weakness and numbness.  Hematological: Negative for adenopathy.  All other systems reviewed and are negative.    Physical Exam Updated Vital Signs BP 139/82 (BP Location: Right Arm)   Pulse  94   Temp 98 F (36.7 C) (Oral)   Resp 18   Ht 5\' 10"  (1.778 m)   Wt 74.8 kg   SpO2 98%   BMI 23.68 kg/m   Physical Exam  Constitutional: He is oriented to person, place, and time. He appears well-developed and well-nourished. No distress.  HENT:  Head: Atraumatic.  Mouth/Throat: Uvula is midline. Mucous membranes are dry.  Eyes: Conjunctivae and EOM are normal. Pupils are equal, round, and reactive to light.  Neck: Normal range of motion. No JVD present.  Cardiovascular: Normal rate, regular rhythm and intact distal pulses.   Pulmonary/Chest: Effort normal and breath sounds normal. No respiratory distress. He exhibits no tenderness.  Abdominal: Soft. He exhibits no mass. There is no rebound and no guarding.  Abdomen is soft, appears mildly distended.  No tenderness on exam.    Musculoskeletal: He exhibits no edema.  Left arm is contracted secondary to previous stroke.  Neurological: He is alert and oriented to person, place, and time. No sensory deficit.  Skin: Skin is warm. Capillary refill takes less than 2 seconds.  Psychiatric: He has a normal mood and affect.  Nursing note and vitals reviewed.    ED Treatments / Results  Labs (all labs ordered are listed, but only abnormal results are displayed) Labs Reviewed  COMPREHENSIVE METABOLIC PANEL - Abnormal; Notable for the following:       Result Value   Chloride 98 (*)    Glucose, Bld 220 (*)    BUN 37 (*)    Creatinine, Ser 1.89 (*)    GFR calc non Af Amer 35 (*)    GFR calc Af Amer 40 (*)    All other components within normal limits  CBC - Abnormal; Notable for the following:    WBC 29.9 (*)    All other components within normal limits  LIPASE, BLOOD  URINALYSIS, ROUTINE W REFLEX MICROSCOPIC  LACTIC ACID, PLASMA  LACTIC ACID, PLASMA    EKG  EKG Interpretation None       Radiology No results found.  Procedures Procedures (including critical care time)  Medications Ordered in ED Medications  sodium  chloride 0.9 % bolus 1,000 mL (not administered)  ondansetron (ZOFRAN) injection 4 mg (not administered)     Initial Impression / Assessment and Plan / ED Course  I have reviewed the triage vital signs and the nursing notes.  Pertinent labs & imaging results that were available during my care of the patient were reviewed by me and considered in my medical decision making (see chart for details).    No active vomiting.  Mucous membranes are dry.  No fever, denies recent antibiotics.  abd is soft. No tenderness or guarding on my exam.  Will obtain CT abd/pelvis, labs, IVF's  LActic acid elevated, likely from dehydration.  Will order  2nd liter NS and recheck.  Afebrile.  No acute distress  1700  Pt findings discussed with Dr. Dayna Barker who accepts pt at end of shift.  Final Clinical Impressions(s) / ED Diagnoses   Final diagnoses:  None    New Prescriptions New Prescriptions   No medications on file     Kem Parkinson, Hershal Coria 10/19/16 1726    Mesner, Corene Cornea, MD 10/19/16 1851

## 2016-10-19 NOTE — ED Triage Notes (Signed)
Abdominal pain and vomiting since yesterday.  Diarrhea several days ago.  Had small BM yesterday.  Per EMS CBG 225.  VSS.

## 2016-10-19 NOTE — Progress Notes (Signed)
Patient refused nasogastric tube. This RN explained the purpose of nasogastric tube and patient expresses that he does not wish to have NG tube placed. Will notify MD and continue with plan of care.

## 2016-10-19 NOTE — H&P (Signed)
History and Physical    Wesley Ingram KGU:[id number] DOB: 1946-05-20 DOA: 10/19/2016  PCP: Reubin Milan, MD Consultants:  Silvio Clayman, podiatry Patient coming from: Home - lives with wife and 2 children; NOK: wife, [phone]; [phone] (for emergencies)  Chief Complaint: n/v  HPI: Wesley Ingram is a 71 y.o. male with medical history significant of remote CVA with resultant left hemiparesis; HTN; hypertriglyceridemia; and DM presenting with persistent n/v.  Started yesterday, acute onset in the afternoon.  Pain in periumbilical region, continued to vomit through the day today.  BP 158/94 today, glucose 254.  Total emesis x 10-15 times, 3-4 times today.  Also with retching, heaves.    Specifically denies urinary symptoms.  Mild RLQ pain.   ED Course: Tachycardia and elevated WBC count; CT with ileus without SBO; also with large right kidney stone with obstruction and hydronephrosis; also with multiple lytic lesions concerning for multiple myeloma.  Review of Systems: As per HPI; otherwise review of systems reviewed and negative.   Ambulatory Status: ambulates with a hemi-walker, wife reports poor ambulation  Past Medical History:  Diagnosis Date  . Diabetes mellitus   . High triglycerides   . Hypertension   . Renal disorder   . Stroke [facility]) 2008   left hemiparesis    Past Surgical History:  Procedure Laterality Date  . HERNIA REPAIR     bilateral inguinal  . TOE AMPUTATION    . TONSILLECTOMY      Social History   Social History  . Marital status: Married    Spouse name: N/A  . Number of children: N/A  . Years of education: N/A   Occupational History  . disabled    Social History Main Topics  . Smoking status: Never Smoker  . Smokeless tobacco: Never Used  . Alcohol use No  . Drug use: No  . Sexual activity: Not on file   Other Topics Concern  . Not on file   Social History Narrative  . No narrative on file    No Known Allergies  Family History    Problem Relation Age of Onset  . Multiple myeloma Mother 66    Prior to Admission medications   Medication Sig Start Date End Date Taking? Authorizing Provider  apixaban (ELIQUIS) 5 MG TABS tablet Take 5 mg by mouth 2 (two) times daily.   Yes [provider]  atorvastatin (LIPITOR) 20 MG tablet Take 10 mg by mouth at bedtime.   Yes [provider]  baclofen (LIORESAL) 10 MG tablet Take 10 mg by mouth daily.   Yes [provider]  gabapentin (NEURONTIN) 100 MG capsule Take 200 mg by mouth every morning.   Yes [provider]  glipiZIDE (GLUCOTROL) 5 MG tablet Take 5 mg by mouth 2 (two) times daily before a meal.    Yes [provider]  Lactobacillus (ACIDOPHILUS PO) Take 1 capsule by mouth 2 (two) times daily.   Yes [provider]  losartan (COZAAR) 100 MG tablet Take 50 mg by mouth daily.    Yes [provider]  Melatonin 1 MG TABS Take 3 mg by mouth at bedtime. [place]    Yes [provider]  metFORMIN (GLUCOPHAGE) 1000 MG tablet Take 1,000 mg by mouth 2 (two) times daily with a meal.   Yes [provider]  metoprolol succinate (TOPROL-XL) 12.5 mg TB24 24 hr tablet Take 12.5 mg by mouth daily.   Yes [provider]  dipyridamole-aspirin (AGGRENOX) 25-200 MG per  History and Physical    Wesley Ingram KGU:[id number] DOB: 1946-05-20 DOA: 10/19/2016  PCP: Reubin Milan, MD Consultants:  Silvio Clayman, podiatry Patient coming from: Home - lives with wife and 2 children; NOK: wife, [phone]; [phone] (for emergencies)  Chief Complaint: n/v  HPI: Wesley Ingram is a 71 y.o. male with medical history significant of remote CVA with resultant left hemiparesis; HTN; hypertriglyceridemia; and DM presenting with persistent n/v.  Started yesterday, acute onset in the afternoon.  Pain in periumbilical region, continued to vomit through the day today.  BP 158/94 today, glucose 254.  Total emesis x 10-15 times, 3-4 times today.  Also with retching, heaves.    Specifically denies urinary symptoms.  Mild RLQ pain.   ED Course: Tachycardia and elevated WBC count; CT with ileus without SBO; also with large right kidney stone with obstruction and hydronephrosis; also with multiple lytic lesions concerning for multiple myeloma.  Review of Systems: As per HPI; otherwise review of systems reviewed and negative.   Ambulatory Status: ambulates with a hemi-walker, wife reports poor ambulation  Past Medical History:  Diagnosis Date  . Diabetes mellitus   . High triglycerides   . Hypertension   . Renal disorder   . Stroke [facility]) 2008   left hemiparesis    Past Surgical History:  Procedure Laterality Date  . HERNIA REPAIR     bilateral inguinal  . TOE AMPUTATION    . TONSILLECTOMY      Social History   Social History  . Marital status: Married    Spouse name: N/A  . Number of children: N/A  . Years of education: N/A   Occupational History  . disabled    Social History Main Topics  . Smoking status: Never Smoker  . Smokeless tobacco: Never Used  . Alcohol use No  . Drug use: No  . Sexual activity: Not on file   Other Topics Concern  . Not on file   Social History Narrative  . No narrative on file    No Known Allergies  Family History    Problem Relation Age of Onset  . Multiple myeloma Mother 66    Prior to Admission medications   Medication Sig Start Date End Date Taking? Authorizing Provider  apixaban (ELIQUIS) 5 MG TABS tablet Take 5 mg by mouth 2 (two) times daily.   Yes [provider]  atorvastatin (LIPITOR) 20 MG tablet Take 10 mg by mouth at bedtime.   Yes [provider]  baclofen (LIORESAL) 10 MG tablet Take 10 mg by mouth daily.   Yes [provider]  gabapentin (NEURONTIN) 100 MG capsule Take 200 mg by mouth every morning.   Yes [provider]  glipiZIDE (GLUCOTROL) 5 MG tablet Take 5 mg by mouth 2 (two) times daily before a meal.    Yes [provider]  Lactobacillus (ACIDOPHILUS PO) Take 1 capsule by mouth 2 (two) times daily.   Yes [provider]  losartan (COZAAR) 100 MG tablet Take 50 mg by mouth daily.    Yes [provider]  Melatonin 1 MG TABS Take 3 mg by mouth at bedtime. [place]    Yes [provider]  metFORMIN (GLUCOPHAGE) 1000 MG tablet Take 1,000 mg by mouth 2 (two) times daily with a meal.   Yes [provider]  metoprolol succinate (TOPROL-XL) 12.5 mg TB24 24 hr tablet Take 12.5 mg by mouth daily.   Yes [provider]  dipyridamole-aspirin (AGGRENOX) 25-200 MG per  Pelvis W Contrast  Result Date: 10/19/2016 CLINICAL DATA:  Abdominal pain and vomiting EXAM: CT ABDOMEN AND PELVIS WITH CONTRAST TECHNIQUE: Multidetector CT imaging of the abdomen and pelvis was performed using the standard protocol following bolus administration of intravenous contrast. CONTRAST:  75mL ISOVUE-300 IOPAMIDOL (ISOVUE-300) INJECTION 61% COMPARISON:  None. FINDINGS: Lower chest: There is patchy bibasilar atelectasis. No consolidation in the lung bases. There are foci of atherosclerotic calcification. There is a small hiatal hernia. Hepatobiliary: No focal liver lesions are appreciable. Gallbladder wall is not appreciably thickened. There is no biliary duct dilatation. Pancreas: No pancreatic mass or inflammatory focus. Spleen: No splenic lesions are evident. There is a tiny splenule medial to the spleen anteriorly. 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 Left adrenal appears normal. There is no renal mass on either side. Right kidney is edematous with severe hydronephrosis. There is a calculus in the proximal right ureter immediately distal to the ureteropelvic junction measuring 1.0 x 0.8 cm. There is no intrarenal calculus on the right. On the left, there is a 5 x 3 mm calculus in the lower pole left kidney,  nonobstructing. There is no ureteral calculus on the left. The urinary bladder has a mildly thickened wall with an irregular contour. There are several small diverticula arising from the urinary bladder. No mass is evident in the urinary bladder. Stomach/Bowel: Several loops of jejunum show mildly thickened walls. There is no appreciable bowel obstruction. No free air or portal venous air. There are occasional colonic diverticular without diverticulitis. Vascular/Lymphatic: There is atherosclerotic calcification in the aorta and common iliac arteries without evident aneurysm. Major mesenteric vessels appear patent. No adenopathy is appreciable in the abdomen or pelvis. Reproductive: Prostate and seminal vesicles appear unremarkable. No pelvic mass. Other: There is moderate fat in the right inguinal ring. There is evidence of previous inguinal hernia repairs bilaterally. There is a minimal ventral hernia containing only fat. Appendix appears unremarkable. There is no ascites or abscess in the abdomen or pelvis. Musculoskeletal: There are small lytic appearing lesions throughout the pelvis and proximal femurs. There is a sclerotic focus in the mid right iliac crest. Several small lucent lesions are noted in the lumbar spine. There is degenerative change in the lumbar spine. There is severe spinal stenosis at L4-5 due to diffuse disc protrusion and bony hypertrophy. No intramuscular or abdominal wall lesion evident. IMPRESSION: 1 x 0.8 cm calculus just distal to the right ureteropelvic junction with severe hydronephrosis on the right. There is right renal edema. Nonobstructing 5 x 3 mm calculus lower pole left kidney. Urinary bladder wall is irregular and thickened without well-defined mass. Several small diverticulum noted. Suspect cystitis. Given the irregularity of the urinary bladder wall, correlation with cystoscopy may well be advisable. Multiple small lytic appearing lesions are noted throughout the bony  structures, concerning for underlying neoplasia/metastatic disease. Etiology uncertain. Sclerotic focus in the mid left iliac bone also noted. No similar sclerotic lesions evident. Given the appearance of the bony structures, it may be prudent to obtain serum electrophoresis to assess for possible multiple myeloma. Several loops of prominent jejunum, likely due to early ileus or enteritis. No bowel obstruction evident. No evident focal liver lesions. Small hiatal hernia. Small ventral hernia containing only fat. Fat in the right inguinal ring. Patient has had inguinal hernia repairs bilaterally. Small benign right adrenal myelolipoma. Spinal stenosis, moderately severe at L4-5, multifactorial. Aortoiliac atherosclerosis.  Coronary artery calcification noted. Electronically Signed   By: Chrissie Noa  Margarita Grizzle III M.D.   On: 10/19/2016 17:02   Dg Chest Port 1 View  Result Date: 10/19/2016 CLINICAL DATA:  Vomiting and sepsis EXAM: PORTABLE CHEST 1 VIEW COMPARISON:  Chest radiograph 12/19/2005 FINDINGS: Shallow lung inflation without focal consolidation or pulmonary edema. No pneumothorax or pleural effusion. Calcific aortic atherosclerosis but otherwise normal cardiomediastinal silhouette. IMPRESSION: Shallow lung inflation without evidence of pneumonia. Electronically Signed   By: Deatra Robinson M.D.   On: 10/19/2016 21:27    EKG: Independently reviewed.  NSR with rate 88; PVC, RBBB and LAFB with no evidence of acute ischemia and NSCSLT  Assessment/Plan Principal Problem:   Ileus (HCC) Active Problems:   DM (diabetes mellitus) (HCC)   HTN (hypertension)   Hyperlipidemia   History of CVA (cerebrovascular accident)   Nephrolithiasis   Lytic bone lesions on xray   Acute renal failure (ARF) (HCC)   Chronic anticoagulation   Ileus -Patient with persistent n/v x 24 hours with ileus but not SBO seen on CXR -Concern would be that this is related to his large UPJ stone with severe hydronephrosis -Patient will  remain NPO -Will place NG tube to LIWS -Zofran prn n/v -Phenergan for breakthrough n/v -Will admit to [facility] for additional specialist support (see below)  Nephrolithiasis with acute renal failure -This is likely the source of his ileus -1 x 0.8 cm obstructing stone with resultant severe right-sided hydronephrosis -Patient discussed with Dr. Berneice Heinrich who agrees that the patient will need stent placement either tonight or in the AM at [facility] -BUN 37, Creatinine 1.89, GFR 35; prior 23/0.94/>60 in 9/12 -IVF hydration at 125 cc/hr -He does technically meet SIRS criteria with presenting tachycardia as well as markedly elevated WBC count (29.9); Procalcitonin 0.17; Lactate 2.9, 2.4, 1.8 -Blood and urine cultures are pending -CT also indicated possible acute cystitis and his UA was mildly abnormal (rare bacteria, 150 glu, small Hgb, 5 ketones, moderate LE, +mucous, 100 protein, TNTC WBC); will cover with Rocephin empirically for now  Chronic anticoagulation -Patient is taking Eliquis for unclear reasons -He does have h/o prior CVA for which he was placed on Aggrenox - but is no longer taking -There is no obvious notation at that time about anticoagulation -Perhaps there was found to be PAF but this is also not obviously documented in the chart -Will continue Eliquis for now as he is not currently anticipated to have surgical procedures that would place him at high risk of bleeding complications -INR 1.39  Lytic lesions -Patient with multiple small lytic lesions throughout bony structes and also with sclerotic focus in mid left iliax bone - concerning for multiple myeloma -SPEP pending -Consider oncology consultation as inpatient vs. Outpatient -He does not have hypercalcemia at this time  DM -Glucose 220, 177 -Hold PO medications (Glucotrol, Glucophage) -Check A1c -Cover with SSI  HTN -Continue Cozaar and Toprol -Mild HTN while in the ER -He may an additional medication if he continues to  have suboptimal control once his other medical issues are stabilized  HLD -Continue Lipitor and Fish oil -Last lipid panel (9/12) shows good control other than elevated triglycerides  h/o CVA -Has residual left hemiparesis -He is taking BB and ARB and statin -He was previously on Aggrenox but is not apparently taking antiplatelet therapy at this time -He likely needs resumption of at least ASA  DVT prophylaxis: Eliquis Code Status: DNR - confirmed with patient/family Family Communication: Wife present throughout evaluation Disposition Plan: To be determined Consults called: Urology; may also need oncology Admission  History and Physical    Wesley Ingram KGU:[id number] DOB: 1946-05-20 DOA: 10/19/2016  PCP: Reubin Milan, MD Consultants:  Silvio Clayman, podiatry Patient coming from: Home - lives with wife and 2 children; NOK: wife, [phone]; [phone] (for emergencies)  Chief Complaint: n/v  HPI: Wesley Ingram is a 71 y.o. male with medical history significant of remote CVA with resultant left hemiparesis; HTN; hypertriglyceridemia; and DM presenting with persistent n/v.  Started yesterday, acute onset in the afternoon.  Pain in periumbilical region, continued to vomit through the day today.  BP 158/94 today, glucose 254.  Total emesis x 10-15 times, 3-4 times today.  Also with retching, heaves.    Specifically denies urinary symptoms.  Mild RLQ pain.   ED Course: Tachycardia and elevated WBC count; CT with ileus without SBO; also with large right kidney stone with obstruction and hydronephrosis; also with multiple lytic lesions concerning for multiple myeloma.  Review of Systems: As per HPI; otherwise review of systems reviewed and negative.   Ambulatory Status: ambulates with a hemi-walker, wife reports poor ambulation  Past Medical History:  Diagnosis Date  . Diabetes mellitus   . High triglycerides   . Hypertension   . Renal disorder   . Stroke [facility]) 2008   left hemiparesis    Past Surgical History:  Procedure Laterality Date  . HERNIA REPAIR     bilateral inguinal  . TOE AMPUTATION    . TONSILLECTOMY      Social History   Social History  . Marital status: Married    Spouse name: N/A  . Number of children: N/A  . Years of education: N/A   Occupational History  . disabled    Social History Main Topics  . Smoking status: Never Smoker  . Smokeless tobacco: Never Used  . Alcohol use No  . Drug use: No  . Sexual activity: Not on file   Other Topics Concern  . Not on file   Social History Narrative  . No narrative on file    No Known Allergies  Family History    Problem Relation Age of Onset  . Multiple myeloma Mother 66    Prior to Admission medications   Medication Sig Start Date End Date Taking? Authorizing Provider  apixaban (ELIQUIS) 5 MG TABS tablet Take 5 mg by mouth 2 (two) times daily.   Yes [provider]  atorvastatin (LIPITOR) 20 MG tablet Take 10 mg by mouth at bedtime.   Yes [provider]  baclofen (LIORESAL) 10 MG tablet Take 10 mg by mouth daily.   Yes [provider]  gabapentin (NEURONTIN) 100 MG capsule Take 200 mg by mouth every morning.   Yes [provider]  glipiZIDE (GLUCOTROL) 5 MG tablet Take 5 mg by mouth 2 (two) times daily before a meal.    Yes [provider]  Lactobacillus (ACIDOPHILUS PO) Take 1 capsule by mouth 2 (two) times daily.   Yes [provider]  losartan (COZAAR) 100 MG tablet Take 50 mg by mouth daily.    Yes [provider]  Melatonin 1 MG TABS Take 3 mg by mouth at bedtime. [place]    Yes [provider]  metFORMIN (GLUCOPHAGE) 1000 MG tablet Take 1,000 mg by mouth 2 (two) times daily with a meal.   Yes [provider]  metoprolol succinate (TOPROL-XL) 12.5 mg TB24 24 hr tablet Take 12.5 mg by mouth daily.   Yes [provider]  dipyridamole-aspirin (AGGRENOX) 25-200 MG per

## 2016-10-19 NOTE — ED Notes (Signed)
CRITICAL VALUE ALERT  Critical value received:  Lactic acid 2.9  Date of notification:  10/19/2016  Time of notification:  1633  Critical value read back:Yes.    Nurse who received alert:  [facility] RN  MD notified (1st page):  Dr. Dayna Barker, Cyndi Bender PA  Time of first page:  1629  MD notified (2nd page):  Time of second page:  Responding MD:  Dr. Loma Messing.Triplett PA  Time MD responded:  [phone]

## 2016-10-19 NOTE — Brief Op Note (Signed)
10/19/2016  11:13 PM  PATIENT:  Wesley Ingram  71 y.o. male  PRE-OPERATIVE DIAGNOSIS:  right ureteral stone  POST-OPERATIVE DIAGNOSIS:  right ureteral stone   PROCEDURE:  Procedure(s): CYSTOSCOPY WITH STENT PLACEMENT (Right)  SURGEON:  Surgeon(s) and Role:    Alexis Frock, MD - Primary  PHYSICIAN ASSISTANT:   ASSISTANTS: none   ANESTHESIA:   general  EBL:  No intake/output data recorded.  BLOOD ADMINISTERED:none  DRAINS: none   LOCAL MEDICATIONS USED:  NONE  SPECIMEN:  Source of Specimen:  Rt renal pelvis urine  DISPOSITION OF SPECIMEN:  microbiology for gram stain and culture  COUNTS:  YES  TOURNIQUET:  * No tourniquets in log *  DICTATION: .Other Dictation: Dictation Number [id number]  PLAN OF CARE: Admit to inpatient   PATIENT DISPOSITION:  PACU - hemodynamically stable.   Delay start of Pharmacological VTE agent (>24hrs) due to surgical blood loss or risk of bleeding: no

## 2016-10-19 NOTE — Consult Note (Signed)
Reason for Consult: Right Ureteral Joaquim Lai, Likely Urosepsis  Referring Physician: Karmen Bongo MD  Wesley Ingram is an 71 y.o. male.   HPI:   1- Right Ureteral Stone - 1cm rt proximal ureteral stone with severe hydro and delayed nephrogram by CT 10/2016 on eval flank pain and nausea / emesis. Stone is solitary, 1cm, SSD 13cm, 1100HU.  2 - Likely Urosepsis - fevers, malaise, leukocytosis to 30k and significant right perinephric stranding worrisome for obsructing pyelo /sepsis. [place] 5/8 obtained and pending. Placed on empiric rocephin.  PMH sig for DM2, CVA with hemiparesis / Eliquus use.  Today " Wesley Ingram " is seen as urgent consult for above.    Past Medical History:  Diagnosis Date  . Diabetes mellitus   . High triglycerides   . Hypertension   . Renal disorder   . Stroke [facility]) 2008   left hemiparesis    Past Surgical History:  Procedure Laterality Date  . HERNIA REPAIR     bilateral inguinal  . TOE AMPUTATION    . TONSILLECTOMY      Family History  Problem Relation Age of Onset  . Multiple myeloma Mother 69    Social History:  reports that he has never smoked. He has never used smokeless tobacco. He reports that he does not drink alcohol or use drugs.  Allergies: No Known Allergies  Medications: I have reviewed the patient's current medications.  Results for orders placed or performed during the hospital encounter of 10/19/16 (from the past 48 hour(s))  Lipase, blood     Status: None   Collection Time: 10/19/16  2:20 PM  Result Value Ref Range   Lipase 30 11 - 51 U/L  Comprehensive metabolic panel     Status: Abnormal   Collection Time: 10/19/16  2:20 PM  Result Value Ref Range   Sodium 135 135 - 145 mmol/L   Potassium 4.1 3.5 - 5.1 mmol/L   Chloride 98 (L) 101 - 111 mmol/L   CO2 24 22 - 32 mmol/L   Glucose, Bld 220 (H) 65 - 99 mg/dL   BUN 37 (H) 6 - 20 mg/dL   Creatinine, Ser 1.89 (H) 0.61 - 1.24 mg/dL   Calcium 9.6 8.9 - 10.3 mg/dL   Total Protein 8.1 6.5  - 8.1 g/dL   Albumin 4.3 3.5 - 5.0 g/dL   AST 25 15 - 41 U/L   ALT 17 17 - 63 U/L   Alkaline Phosphatase 52 38 - 126 U/L   Total Bilirubin 0.9 0.3 - 1.2 mg/dL   GFR calc non Af Amer 35 (L) >60 mL/min   GFR calc Af Amer 40 (L) >60 mL/min    Comment: (NOTE) The eGFR has been calculated using the CKD EPI equation. This calculation has not been validated in all clinical situations. eGFR's persistently <60 mL/min signify possible Chronic Kidney Disease.    Anion gap 13 5 - 15  CBC     Status: Abnormal   Collection Time: 10/19/16  2:20 PM  Result Value Ref Range   WBC 29.9 (H) 4.0 - 10.5 K/uL    Comment: WHITE COUNT CONFIRMED ON SMEAR   RBC 4.79 4.22 - 5.81 MIL/uL   Hemoglobin 14.3 13.0 - 17.0 g/dL   HCT 41.4 39.0 - 52.0 %   MCV 86.4 78.0 - 100.0 fL   MCH 29.9 26.0 - 34.0 pg   MCHC 34.5 30.0 - 36.0 g/dL   RDW 13.4 11.5 - 15.5 %   Platelets 277 150 -  400 K/uL  Lactic acid, plasma     Status: Abnormal   Collection Time: 10/19/16  3:39 PM  Result Value Ref Range   Lactic Acid, Venous 2.9 (HH) 0.5 - 1.9 mmol/L    Comment: CRITICAL RESULT CALLED TO, READ BACK BY AND VERIFIED WITH: CARDWELL,L AT 1630 ON5.8.2018 BY ISLEY,B   Urinalysis, Routine w reflex microscopic     Status: Abnormal   Collection Time: 10/19/16  5:29 PM  Result Value Ref Range   Color, Urine YELLOW YELLOW   APPearance CLOUDY (A) CLEAR   Specific Gravity, Urine 1.021 1.005 - 1.030   pH 7.0 5.0 - 8.0   Glucose, UA 150 (A) NEGATIVE mg/dL   Hgb urine dipstick SMALL (A) NEGATIVE   Bilirubin Urine NEGATIVE NEGATIVE   Ketones, ur 5 (A) NEGATIVE mg/dL   Protein, ur 100 (A) NEGATIVE mg/dL   Nitrite NEGATIVE NEGATIVE   Leukocytes, UA MODERATE (A) NEGATIVE   RBC / HPF 6-30 0 - 5 RBC/hpf   WBC, UA TOO NUMEROUS TO COUNT 0 - 5 WBC/hpf   Bacteria, UA RARE (A) NONE SEEN   Squamous Epithelial / LPF NONE SEEN NONE SEEN   Mucous PRESENT   Lactic acid, plasma     Status: Abnormal   Collection Time: 10/19/16  5:52 PM  Result  Value Ref Range   Lactic Acid, Venous 2.4 (HH) 0.5 - 1.9 mmol/L    Comment: CRITICAL RESULT CALLED TO, READ BACK BY AND VERIFIED WITH: DOSS,M ON 10/19/16 AT 1900 BY LOY,C   Lactic acid, plasma     Status: None   Collection Time: 10/19/16  8:20 PM  Result Value Ref Range   Lactic Acid, Venous 1.8 0.5 - 1.9 mmol/L  Procalcitonin     Status: None   Collection Time: 10/19/16  8:20 PM  Result Value Ref Range   Procalcitonin 0.17 ng/mL    Comment:        Interpretation: PCT (Procalcitonin) <= 0.5 ng/mL: Systemic infection (sepsis) is not likely. Local bacterial infection is possible. (NOTE)         ICU PCT Algorithm               Non ICU PCT Algorithm    ----------------------------     ------------------------------         PCT < 0.25 ng/mL                 PCT < 0.1 ng/mL     Stopping of antibiotics            Stopping of antibiotics       strongly encouraged.               strongly encouraged.    ----------------------------     ------------------------------       PCT level decrease by               PCT < 0.25 ng/mL       >= 80% from peak PCT       OR PCT 0.25 - 0.5 ng/mL          Stopping of antibiotics                                             encouraged.     Stopping of antibiotics           encouraged.    ----------------------------     ------------------------------  PCT level decrease by              PCT >= 0.25 ng/mL       < 80% from peak PCT        AND PCT >= 0.5 ng/mL            Continuin g antibiotics                                              encouraged.       Continuing antibiotics            encouraged.    ----------------------------     ------------------------------     PCT level increase compared          PCT > 0.5 ng/mL         with peak PCT AND          PCT >= 0.5 ng/mL             Escalation of antibiotics                                          strongly encouraged.      Escalation of antibiotics        strongly encouraged.   Protime-INR      Status: Abnormal   Collection Time: 10/19/16  8:20 PM  Result Value Ref Range   Prothrombin Time 17.2 (H) 11.4 - 15.2 seconds   INR 1.39   APTT     Status: Abnormal   Collection Time: 10/19/16  8:20 PM  Result Value Ref Range   aPTT 40 (H) 24 - 36 seconds    Comment:        IF BASELINE aPTT IS ELEVATED, SUGGEST PATIENT RISK ASSESSMENT BE USED TO DETERMINE APPROPRIATE ANTICOAGULANT THERAPY.   Glucose, capillary     Status: Abnormal   Collection Time: 10/19/16  8:29 PM  Result Value Ref Range   Glucose-Capillary 177 (H) 65 - 99 mg/dL    Ct Abdomen Pelvis W Contrast  Result Date: 10/19/2016 CLINICAL DATA:  Abdominal pain and vomiting EXAM: CT ABDOMEN AND PELVIS WITH CONTRAST TECHNIQUE: Multidetector CT imaging of the abdomen and pelvis was performed using the standard protocol following bolus administration of intravenous contrast. CONTRAST:  79m ISOVUE-300 IOPAMIDOL (ISOVUE-300) INJECTION 61% COMPARISON:  None. FINDINGS: Lower chest: There is patchy bibasilar atelectasis. No consolidation in the lung bases. There are foci of atherosclerotic calcification. There is a small hiatal hernia. Hepatobiliary: No focal liver lesions are appreciable. Gallbladder wall is not appreciably thickened. There is no biliary duct dilatation. Pancreas: No pancreatic mass or inflammatory focus. Spleen: No splenic lesions are evident. There is a tiny splenule medial to the spleen anteriorly. 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 Left adrenal appears normal. There is no renal mass on either side. Right kidney is edematous with severe hydronephrosis. There is a calculus in the proximal right ureter immediately distal to the ureteropelvic junction measuring 1.0 x 0.8 cm. There is no intrarenal calculus on the right. On the left, there is a 5 x 3 mm calculus in the lower pole left kidney, nonobstructing. There is no ureteral calculus on the left. The urinary bladder has a mildly  thickened  wall with an irregular contour. There are several small diverticula arising from the urinary bladder. No mass is evident in the urinary bladder. Stomach/Bowel: Several loops of jejunum show mildly thickened walls. There is no appreciable bowel obstruction. No free air or portal venous air. There are occasional colonic diverticular without diverticulitis. Vascular/Lymphatic: There is atherosclerotic calcification in the aorta and common iliac arteries without evident aneurysm. Major mesenteric vessels appear patent. No adenopathy is appreciable in the abdomen or pelvis. Reproductive: Prostate and seminal vesicles appear unremarkable. No pelvic mass. Other: There is moderate fat in the right inguinal ring. There is evidence of previous inguinal hernia repairs bilaterally. There is a minimal ventral hernia containing only fat. Appendix appears unremarkable. There is no ascites or abscess in the abdomen or pelvis. Musculoskeletal: There are small lytic appearing lesions throughout the pelvis and proximal femurs. There is a sclerotic focus in the mid right iliac crest. Several small lucent lesions are noted in the lumbar spine. There is degenerative change in the lumbar spine. There is severe spinal stenosis at L4-5 due to diffuse disc protrusion and bony hypertrophy. No intramuscular or abdominal wall lesion evident. IMPRESSION: 1 x 0.8 cm calculus just distal to the right ureteropelvic junction with severe hydronephrosis on the right. There is right renal edema. Nonobstructing 5 x 3 mm calculus lower pole left kidney. Urinary bladder wall is irregular and thickened without well-defined mass. Several small diverticulum noted. Suspect cystitis. Given the irregularity of the urinary bladder wall, correlation with cystoscopy may well be advisable. Multiple small lytic appearing lesions are noted throughout the bony structures, concerning for underlying neoplasia/metastatic disease. Etiology uncertain. Sclerotic  focus in the mid left iliac bone also noted. No similar sclerotic lesions evident. Given the appearance of the bony structures, it may be prudent to obtain serum electrophoresis to assess for possible multiple myeloma. Several loops of prominent jejunum, likely due to early ileus or enteritis. No bowel obstruction evident. No evident focal liver lesions. Small hiatal hernia. Small ventral hernia containing only fat. Fat in the right inguinal ring. Patient has had inguinal hernia repairs bilaterally. Small benign right adrenal myelolipoma. Spinal stenosis, moderately severe at L4-5, multifactorial. Aortoiliac atherosclerosis.  Coronary artery calcification noted. Electronically Signed   By: Lowella Grip III M.D.   On: 10/19/2016 17:02   Dg Chest Port 1 View  Result Date: 10/19/2016 CLINICAL DATA:  Vomiting and sepsis EXAM: PORTABLE CHEST 1 VIEW COMPARISON:  Chest radiograph 12/19/2005 FINDINGS: Shallow lung inflation without focal consolidation or pulmonary edema. No pneumothorax or pleural effusion. Calcific aortic atherosclerosis but otherwise normal cardiomediastinal silhouette. IMPRESSION: Shallow lung inflation without evidence of pneumonia. Electronically Signed   By: Ulyses Jarred M.D.   On: 10/19/2016 21:27    Review of Systems  Constitutional: Positive for chills, fever and malaise/fatigue.  HENT: Negative.   Eyes: Negative.   Respiratory: Negative.   Cardiovascular: Negative.   Gastrointestinal: Positive for nausea and vomiting.  Genitourinary: Positive for flank pain.  Skin: Negative.   Neurological: Positive for focal weakness.  Endo/Heme/Allergies: Negative.   Psychiatric/Behavioral: Negative.    Blood pressure (!) 159/54, pulse 97, temperature 100 F (37.8 C), temperature source Oral, resp. rate (!) 23, height 5' 10" (1.778 m), weight 74.8 kg (165 lb), SpO2 95 %. Physical Exam  Constitutional:  Stigmata of CVA  HENT:  Head: Normocephalic.  Eyes: Pupils are equal, round,  and reactive to light.  Neck: Normal range of motion.  Cardiovascular:  Regular tachycardia  Respiratory: Effort normal.  GI: Soft.  Genitourinary:  Genitourinary Comments: Mild Rt CVAT  Neurological: He is alert.  Skin: Skin is warm.    Assessment/Plan:  1- Right Ureteral Stone - rec ugent decompression with Rt JJ stent today. Will need ureteroscopy in elective setting after clears infectious parameters.   Risks, benefits, alternatives (neph tube, non-treatment), expected peri-op course discussed.   2 - Likely Urosepsis - agree with current ABX pending further CX data. Renal decompression as per above.   Wesley Ingram, Wesley Ingram 10/19/2016, 9:52 PM

## 2016-10-19 NOTE — ED Notes (Signed)
Called [facility] for transport

## 2016-10-19 NOTE — ED Notes (Signed)
ED Provider at bedside. 

## 2016-10-19 NOTE — Progress Notes (Signed)
Pharmacy Antibiotic Note  Wesley Ingram is a 71 y.o. male admitted on 10/19/2016 with UTI.  Pt has hx of stroke and multiple other co-morbidities.  CT scan shows ileus but not obstruction.  Does have a large rt kidney stone with a obstruction and hydronephrosis. Pharmacy has been consulted for ceftriaxone dosing.  Plan: Ceftriaxone 1gm IV q24h Pharmacy will sign off as renal adjustment is not needed  Height: 5\' 10"  (177.8 cm) Weight: 165 lb (74.8 kg) IBW/kg (Calculated) : 73  Temp (24hrs), Avg:98.7 F (37.1 C), Min:98 F (36.7 C), Max:100 F (37.8 C)   Recent Labs Lab 10/19/16 1420 10/19/16 1539 10/19/16 1752  WBC 29.9*  --   --   CREATININE 1.89*  --   --   LATICACIDVEN  --  2.9* 2.4*    Estimated Creatinine Clearance: 38.1 mL/min (A) (by C-G formula based on SCr of 1.89 mg/dL (H)).    No Known Allergies  Antimicrobials this admission: 5/8 ceftriaxone >>   Microbiology results: 5/8 BCx: sent   Thank you for allowing pharmacy to be a part of this patient's care.  Dolly Rias RPh 10/19/2016, 7:59 PM Pager [phone]

## 2016-10-19 NOTE — Op Note (Signed)
NAME:  TREYOR, CAVAZOS NO.:  [id number]  MEDICAL RECORD NO.:  [id number]  LOCATION:  [facility]A14                         FACILITY:  [facility]  PHYSICIAN:  Sebastian Ache, MD     DATE OF BIRTH:  1946-04-10  DATE OF PROCEDURE: 10/19/2016                              OPERATIVE REPORT   DIAGNOSIS:  Right ureteral stone with suspected urosepsis.  PROCEDURES: 1. Cystoscopy with right retrograde pyelogram and interpretation. 2. Right ureteral stent placement, 6 x 26 Contour, no tether.  ESTIMATED BLOOD LOSS:  Nil.  COMPLICATION:  None.  SPECIMENS:  Right renal pelvis urine for Gram stain and culture.  FINDINGS: 1. Mildly trabeculated bladder. 2. Significant hydronephrosis without distal ureteral obstruction. 3. Successful placement of right ureteral stent, proximal in the renal     pelvis and distal in the urinary bladder.  INDICATION:  Mr. Lammers is a 71 year old gentleman with significant comorbidities including a diabetes and history of stroke, on blood thinners.  He was found on workup of fever, malaise, nausea and vomiting to have a large right proximal ureteral stone as well as leukocytosis and fever worrisome for impending urosepsis.  It was felt that urgent renal decompression was warranted.  We discussed options of nephrostomy tube versus stenting, and he wished to proceed with trial of stenting. Informed consent was obtained and placed in the medical record.  PROCEDURE IN DETAIL:  The patient being Roshod Capo, was verified. Procedure being right ureteral stent placement was confirmed.  Procedure was carried out.  Time-out was performed.  Intravenous antibiotics were administered.  General anesthesia was introduced.  The patient was placed into a medial lithotomy position and sterile field was created by prepping and draping the patient's penis, perineum and proximal thighs using iodine.  Cystourethroscopy was then performed using a rigid cystoscope with  offset lens.  Inspection of the anterior and posterior urethra were unremarkable.  Inspection of the urinary bladder revealed mild trabeculation.  Ureteral orifices were [place].  The right ureteral orifice was cannulated with a 6-French end-hole catheter and right retrograde pyelogram was obtained.  Right retrograde pyelogram demonstrated a single right ureter with single-system right kidney.  There was significant hydronephrosis to filling defect in proximal ureter consistent with known stone.  There was some moderate tortuosity noted.  A 0.038 Zip wire was then advanced to the level of the upper pole and the open-ended catheter was advanced to the level of the kidney and hydronephrotic drip was obtained, and specimen sent for Gram stain and culture, it was not grossly purulent. A wire was once again placed at the upper pole and the open-ended catheter was exchanged for a new 6 x 26 Contour-type stent using cystoscopic and fluoroscopic guidance, proximal in renal pelvis and distal in urinary bladder.  The efflux of copious urine around the end of the stent.  Given his fevers and tachycardia, it was felt that Foley catheterization would be warranted.  As such, a new 16-French Foley catheter was placed per urethra to straight drain, 10 mL sterile water in the balloon.  Procedure was terminated, the patient tolerated the procedure well.  There were no immediate periprocedural complications. The patient was taken to the postanesthesia care  unit in stable condition.          ______________________________ Sebastian Ache, MD     TM/MEDQ  D:  10/19/2016  T:  10/19/2016  Job:  [id number]

## 2016-10-19 NOTE — H&P (View-Only) (Signed)
Reason for Consult: Right Ureteral Stone, Likely Urosepsis  Referring Physician: Jennifer Yates MD  Wesley Ingram is an 70 y.o. male.   HPI:   1- Right Ureteral Stone - 1cm rt proximal ureteral stone with severe hydro and delayed nephrogram by CT 10/2016 on eval flank pain and nausea / emesis. Stone is solitary, 1cm, SSD 13cm, 1100HU.  2 - Likely Urosepsis - fevers, malaise, leukocytosis to 30k and significant right perinephric stranding worrisome for obsructing pyelo /sepsis. BCX 5/8 obtained and pending. Placed on empiric rocephin.  PMH sig for DM2, CVA with hemiparesis / Eliquus use.  Today " Wesley Ingram " is seen as urgent consult for above.    Past Medical History:  Diagnosis Date  . Diabetes mellitus   . High triglycerides   . Hypertension   . Renal disorder   . Stroke (HCC) 2008   left hemiparesis    Past Surgical History:  Procedure Laterality Date  . HERNIA REPAIR     bilateral inguinal  . TOE AMPUTATION    . TONSILLECTOMY      Family History  Problem Relation Age of Onset  . Multiple myeloma Mother 85    Social History:  reports that he has never smoked. He has never used smokeless tobacco. He reports that he does not drink alcohol or use drugs.  Allergies: No Known Allergies  Medications: I have reviewed the patient's current medications.  Results for orders placed or performed during the hospital encounter of 10/19/16 (from the past 48 hour(s))  Lipase, blood     Status: None   Collection Time: 10/19/16  2:20 PM  Result Value Ref Range   Lipase 30 11 - 51 U/L  Comprehensive metabolic panel     Status: Abnormal   Collection Time: 10/19/16  2:20 PM  Result Value Ref Range   Sodium 135 135 - 145 mmol/L   Potassium 4.1 3.5 - 5.1 mmol/L   Chloride 98 (L) 101 - 111 mmol/L   CO2 24 22 - 32 mmol/L   Glucose, Bld 220 (H) 65 - 99 mg/dL   BUN 37 (H) 6 - 20 mg/dL   Creatinine, Ser 1.89 (H) 0.61 - 1.24 mg/dL   Calcium 9.6 8.9 - 10.3 mg/dL   Total Protein 8.1 6.5  - 8.1 g/dL   Albumin 4.3 3.5 - 5.0 g/dL   AST 25 15 - 41 U/L   ALT 17 17 - 63 U/L   Alkaline Phosphatase 52 38 - 126 U/L   Total Bilirubin 0.9 0.3 - 1.2 mg/dL   GFR calc non Af Amer 35 (L) >60 mL/min   GFR calc Af Amer 40 (L) >60 mL/min    Comment: (NOTE) The eGFR has been calculated using the CKD EPI equation. This calculation has not been validated in all clinical situations. eGFR's persistently <60 mL/min signify possible Chronic Kidney Disease.    Anion gap 13 5 - 15  CBC     Status: Abnormal   Collection Time: 10/19/16  2:20 PM  Result Value Ref Range   WBC 29.9 (H) 4.0 - 10.5 K/uL    Comment: WHITE COUNT CONFIRMED ON SMEAR   RBC 4.79 4.22 - 5.81 MIL/uL   Hemoglobin 14.3 13.0 - 17.0 g/dL   HCT 41.4 39.0 - 52.0 %   MCV 86.4 78.0 - 100.0 fL   MCH 29.9 26.0 - 34.0 pg   MCHC 34.5 30.0 - 36.0 g/dL   RDW 13.4 11.5 - 15.5 %   Platelets 277 150 -   400 K/uL  Lactic acid, plasma     Status: Abnormal   Collection Time: 10/19/16  3:39 PM  Result Value Ref Range   Lactic Acid, Venous 2.9 (HH) 0.5 - 1.9 mmol/L    Comment: CRITICAL RESULT CALLED TO, READ BACK BY AND VERIFIED WITH: CARDWELL,L AT 1630 ON5.8.2018 BY ISLEY,B   Urinalysis, Routine w reflex microscopic     Status: Abnormal   Collection Time: 10/19/16  5:29 PM  Result Value Ref Range   Color, Urine YELLOW YELLOW   APPearance CLOUDY (A) CLEAR   Specific Gravity, Urine 1.021 1.005 - 1.030   pH 7.0 5.0 - 8.0   Glucose, UA 150 (A) NEGATIVE mg/dL   Hgb urine dipstick SMALL (A) NEGATIVE   Bilirubin Urine NEGATIVE NEGATIVE   Ketones, ur 5 (A) NEGATIVE mg/dL   Protein, ur 100 (A) NEGATIVE mg/dL   Nitrite NEGATIVE NEGATIVE   Leukocytes, UA MODERATE (A) NEGATIVE   RBC / HPF 6-30 0 - 5 RBC/hpf   WBC, UA TOO NUMEROUS TO COUNT 0 - 5 WBC/hpf   Bacteria, UA RARE (A) NONE SEEN   Squamous Epithelial / LPF NONE SEEN NONE SEEN   Mucous PRESENT   Lactic acid, plasma     Status: Abnormal   Collection Time: 10/19/16  5:52 PM  Result  Value Ref Range   Lactic Acid, Venous 2.4 (HH) 0.5 - 1.9 mmol/L    Comment: CRITICAL RESULT CALLED TO, READ BACK BY AND VERIFIED WITH: DOSS,M ON 10/19/16 AT 1900 BY LOY,C   Lactic acid, plasma     Status: None   Collection Time: 10/19/16  8:20 PM  Result Value Ref Range   Lactic Acid, Venous 1.8 0.5 - 1.9 mmol/L  Procalcitonin     Status: None   Collection Time: 10/19/16  8:20 PM  Result Value Ref Range   Procalcitonin 0.17 ng/mL    Comment:        Interpretation: PCT (Procalcitonin) <= 0.5 ng/mL: Systemic infection (sepsis) is not likely. Local bacterial infection is possible. (NOTE)         ICU PCT Algorithm               Non ICU PCT Algorithm    ----------------------------     ------------------------------         PCT < 0.25 ng/mL                 PCT < 0.1 ng/mL     Stopping of antibiotics            Stopping of antibiotics       strongly encouraged.               strongly encouraged.    ----------------------------     ------------------------------       PCT level decrease by               PCT < 0.25 ng/mL       >= 80% from peak PCT       OR PCT 0.25 - 0.5 ng/mL          Stopping of antibiotics                                             encouraged.     Stopping of antibiotics           encouraged.    ----------------------------     ------------------------------         PCT level decrease by              PCT >= 0.25 ng/mL       < 80% from peak PCT        AND PCT >= 0.5 ng/mL            Continuin g antibiotics                                              encouraged.       Continuing antibiotics            encouraged.    ----------------------------     ------------------------------     PCT level increase compared          PCT > 0.5 ng/mL         with peak PCT AND          PCT >= 0.5 ng/mL             Escalation of antibiotics                                          strongly encouraged.      Escalation of antibiotics        strongly encouraged.   Protime-INR      Status: Abnormal   Collection Time: 10/19/16  8:20 PM  Result Value Ref Range   Prothrombin Time 17.2 (H) 11.4 - 15.2 seconds   INR 1.39   APTT     Status: Abnormal   Collection Time: 10/19/16  8:20 PM  Result Value Ref Range   aPTT 40 (H) 24 - 36 seconds    Comment:        IF BASELINE aPTT IS ELEVATED, SUGGEST PATIENT RISK ASSESSMENT BE USED TO DETERMINE APPROPRIATE ANTICOAGULANT THERAPY.   Glucose, capillary     Status: Abnormal   Collection Time: 10/19/16  8:29 PM  Result Value Ref Range   Glucose-Capillary 177 (H) 65 - 99 mg/dL    Ct Abdomen Pelvis W Contrast  Result Date: 10/19/2016 CLINICAL DATA:  Abdominal pain and vomiting EXAM: CT ABDOMEN AND PELVIS WITH CONTRAST TECHNIQUE: Multidetector CT imaging of the abdomen and pelvis was performed using the standard protocol following bolus administration of intravenous contrast. CONTRAST:  79m ISOVUE-300 IOPAMIDOL (ISOVUE-300) INJECTION 61% COMPARISON:  None. FINDINGS: Lower chest: There is patchy bibasilar atelectasis. No consolidation in the lung bases. There are foci of atherosclerotic calcification. There is a small hiatal hernia. Hepatobiliary: No focal liver lesions are appreciable. Gallbladder wall is not appreciably thickened. There is no biliary duct dilatation. Pancreas: No pancreatic mass or inflammatory focus. Spleen: No splenic lesions are evident. There is a tiny splenule medial to the spleen anteriorly. 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 Left adrenal appears normal. There is no renal mass on either side. Right kidney is edematous with severe hydronephrosis. There is a calculus in the proximal right ureter immediately distal to the ureteropelvic junction measuring 1.0 x 0.8 cm. There is no intrarenal calculus on the right. On the left, there is a 5 x 3 mm calculus in the lower pole left kidney, nonobstructing. There is no ureteral calculus on the left. The urinary bladder has a mildly  thickened  wall with an irregular contour. There are several small diverticula arising from the urinary bladder. No mass is evident in the urinary bladder. Stomach/Bowel: Several loops of jejunum show mildly thickened walls. There is no appreciable bowel obstruction. No free air or portal venous air. There are occasional colonic diverticular without diverticulitis. Vascular/Lymphatic: There is atherosclerotic calcification in the aorta and common iliac arteries without evident aneurysm. Major mesenteric vessels appear patent. No adenopathy is appreciable in the abdomen or pelvis. Reproductive: Prostate and seminal vesicles appear unremarkable. No pelvic mass. Other: There is moderate fat in the right inguinal ring. There is evidence of previous inguinal hernia repairs bilaterally. There is a minimal ventral hernia containing only fat. Appendix appears unremarkable. There is no ascites or abscess in the abdomen or pelvis. Musculoskeletal: There are small lytic appearing lesions throughout the pelvis and proximal femurs. There is a sclerotic focus in the mid right iliac crest. Several small lucent lesions are noted in the lumbar spine. There is degenerative change in the lumbar spine. There is severe spinal stenosis at L4-5 due to diffuse disc protrusion and bony hypertrophy. No intramuscular or abdominal wall lesion evident. IMPRESSION: 1 x 0.8 cm calculus just distal to the right ureteropelvic junction with severe hydronephrosis on the right. There is right renal edema. Nonobstructing 5 x 3 mm calculus lower pole left kidney. Urinary bladder wall is irregular and thickened without well-defined mass. Several small diverticulum noted. Suspect cystitis. Given the irregularity of the urinary bladder wall, correlation with cystoscopy may well be advisable. Multiple small lytic appearing lesions are noted throughout the bony structures, concerning for underlying neoplasia/metastatic disease. Etiology uncertain. Sclerotic  focus in the mid left iliac bone also noted. No similar sclerotic lesions evident. Given the appearance of the bony structures, it may be prudent to obtain serum electrophoresis to assess for possible multiple myeloma. Several loops of prominent jejunum, likely due to early ileus or enteritis. No bowel obstruction evident. No evident focal liver lesions. Small hiatal hernia. Small ventral hernia containing only fat. Fat in the right inguinal ring. Patient has had inguinal hernia repairs bilaterally. Small benign right adrenal myelolipoma. Spinal stenosis, moderately severe at L4-5, multifactorial. Aortoiliac atherosclerosis.  Coronary artery calcification noted. Electronically Signed   By: Mostafa  Woodruff III M.D.   On: 10/19/2016 17:02   Dg Chest Port 1 View  Result Date: 10/19/2016 CLINICAL DATA:  Vomiting and sepsis EXAM: PORTABLE CHEST 1 VIEW COMPARISON:  Chest radiograph 12/19/2005 FINDINGS: Shallow lung inflation without focal consolidation or pulmonary edema. No pneumothorax or pleural effusion. Calcific aortic atherosclerosis but otherwise normal cardiomediastinal silhouette. IMPRESSION: Shallow lung inflation without evidence of pneumonia. Electronically Signed   By: Kevin  Herman M.D.   On: 10/19/2016 21:27    Review of Systems  Constitutional: Positive for chills, fever and malaise/fatigue.  HENT: Negative.   Eyes: Negative.   Respiratory: Negative.   Cardiovascular: Negative.   Gastrointestinal: Positive for nausea and vomiting.  Genitourinary: Positive for flank pain.  Skin: Negative.   Neurological: Positive for focal weakness.  Endo/Heme/Allergies: Negative.   Psychiatric/Behavioral: Negative.    Blood pressure (!) 159/54, pulse 97, temperature 100 F (37.8 C), temperature source Oral, resp. rate (!) 23, height 5' 10" (1.778 m), weight 74.8 kg (165 lb), SpO2 95 %. Physical Exam  Constitutional:  Stigmata of CVA  HENT:  Head: Normocephalic.  Eyes: Pupils are equal, round,  and reactive to light.  Neck: Normal range of motion.  Cardiovascular:  Regular tachycardia  Respiratory: Effort normal.    GI: Soft.  Genitourinary:  Genitourinary Comments: Mild Rt CVAT  Neurological: He is alert.  Skin: Skin is warm.    Assessment/Plan:  1- Right Ureteral Stone - rec ugent decompression with Rt JJ stent today. Will need ureteroscopy in elective setting after clears infectious parameters.   Risks, benefits, alternatives (neph tube, non-treatment), expected peri-op course discussed.   2 - Likely Urosepsis - agree with current ABX pending further CX data. Renal decompression as per above.   Shaniqwa Horsman 10/19/2016, 9:52 PM     

## 2016-10-19 NOTE — Anesthesia Preprocedure Evaluation (Addendum)
Anesthesia Evaluation  Patient identified by MRN, date of birth, ID band Patient awake    Reviewed: Allergy & Precautions, NPO status , Patient's Chart, lab work & pertinent test results  Airway Mallampati: II  TM Distance: >3 FB Neck ROM: Full    Dental no notable dental hx.    Pulmonary neg pulmonary ROS,    Pulmonary exam normal breath sounds clear to auscultation       Cardiovascular hypertension, Normal cardiovascular exam Rhythm:Regular Rate:Normal     Neuro/Psych CVA, Residual Symptoms negative psych ROS   GI/Hepatic negative GI ROS, Neg liver ROS,   Endo/Other  diabetes  Renal/GU Renal InsufficiencyRenal disease  negative genitourinary   Musculoskeletal negative musculoskeletal ROS (+)   Abdominal   Peds negative pediatric ROS (+)  Hematology anticoagulated   Anesthesia Other Findings   Reproductive/Obstetrics negative OB ROS                             Anesthesia Physical Anesthesia Plan  ASA: IV and emergent  Anesthesia Plan: General   Post-op Pain Management:    Induction: Intravenous  Airway Management Planned: Oral ETT  Additional Equipment:   Intra-op Plan:   Post-operative Plan: Possible Post-op intubation/ventilation  Informed Consent: I have reviewed the patients History and Physical, chart, labs and discussed the procedure including the risks, benefits and alternatives for the proposed anesthesia with the patient or authorized representative who has indicated his/her understanding and acceptance.   Dental advisory given  Plan Discussed with: CRNA and Surgeon  Anesthesia Plan Comments:       Anesthesia Quick Evaluation

## 2016-10-19 NOTE — Interval H&P Note (Signed)
History and Physical Interval Note:  10/19/2016 10:24 PM  Wesley Ingram  has presented today for surgery, with the diagnosis of right ureteral stone  The various methods of treatment have been discussed with the patient and family. After consideration of risks, benefits and other options for treatment, the patient has consented to  Procedure(s): CYSTOSCOPY WITH STENT PLACEMENT (Right) as a surgical intervention .  The patient's history has been reviewed, patient examined, no change in status, stable for surgery.  I have reviewed the patient's chart and labs.  Questions were answered to the patient's satisfaction.     Graesyn Schreifels

## 2016-10-19 NOTE — Transfer of Care (Signed)
Immediate Anesthesia Transfer of Care Note  Patient: Wesley Ingram  Procedure(s) Performed: Procedure(s): CYSTOSCOPY WITH STENT PLACEMENT (Right)  Patient Location: PACU  Anesthesia Type:General  Level of Consciousness:  sedated, patient cooperative and responds to stimulation  Airway & Oxygen Therapy:Patient Spontanous Breathing and Patient connected to face mask oxgen  Post-op Assessment:  Report given to PACU RN and Post -op Vital signs reviewed and stable  Post vital signs:  Reviewed and stable  Last Vitals:  Vitals:   10/19/16 1830 10/19/16 1950  BP: (!) 153/95 (!) 159/54  Pulse:  97  Resp: (!) 23   Temp:  28.1 C    Complications: No apparent anesthesia complications

## 2016-10-20 ENCOUNTER — Inpatient Hospital Stay (HOSPITAL_COMMUNITY): Payer: BLUE CROSS/BLUE SHIELD

## 2016-10-20 ENCOUNTER — Encounter (HOSPITAL_COMMUNITY): Payer: Self-pay | Admitting: Urology

## 2016-10-20 DIAGNOSIS — D649 Anemia, unspecified: Secondary | ICD-10-CM | POA: Diagnosis not present

## 2016-10-20 DIAGNOSIS — N179 Acute kidney failure, unspecified: Secondary | ICD-10-CM | POA: Diagnosis not present

## 2016-10-20 DIAGNOSIS — M899 Disorder of bone, unspecified: Secondary | ICD-10-CM | POA: Diagnosis not present

## 2016-10-20 DIAGNOSIS — K567 Ileus, unspecified: Secondary | ICD-10-CM | POA: Diagnosis not present

## 2016-10-20 DIAGNOSIS — N189 Chronic kidney disease, unspecified: Secondary | ICD-10-CM | POA: Diagnosis not present

## 2016-10-20 DIAGNOSIS — A4189 Other specified sepsis: Secondary | ICD-10-CM | POA: Diagnosis not present

## 2016-10-20 LAB — GLUCOSE, CAPILLARY
GLUCOSE-CAPILLARY: 151 mg/dL — AB (ref 65–99)
GLUCOSE-CAPILLARY: 132 mg/dL — AB (ref 65–99)
GLUCOSE-CAPILLARY: 188 mg/dL — AB (ref 65–99)
Glucose-Capillary: 150 mg/dL — ABNORMAL HIGH (ref 65–99)
Glucose-Capillary: 113 mg/dL — ABNORMAL HIGH (ref 65–99)

## 2016-10-20 LAB — BASIC METABOLIC PANEL
ANION GAP: 10 (ref 5–15)
BUN: 34 mg/dL — ABNORMAL HIGH (ref 6–20)
CHLORIDE: 104 mmol/L (ref 101–111)
CO2: 21 mmol/L — AB (ref 22–32)
Calcium: 7.9 mg/dL — ABNORMAL LOW (ref 8.9–10.3)
Creatinine, Ser: 1.69 mg/dL — ABNORMAL HIGH (ref 0.61–1.24)
GFR calc non Af Amer: 40 mL/min — ABNORMAL LOW (ref >60)
GFR, EST AFRICAN AMERICAN: 46 mL/min — AB (ref >60)
Glucose, Bld: 158 mg/dL — ABNORMAL HIGH (ref 65–99)
Potassium: 3.7 mmol/L (ref 3.5–5.1)
Sodium: 135 mmol/L (ref 135–145)

## 2016-10-20 LAB — PROTEIN ELECTROPHORESIS, SERUM
A/G Ratio: 1 (ref 0.7–1.7)
ALBUMIN ELP: 3.5 g/dL (ref 2.9–4.4)
ALPHA-1-GLOBULIN: 0.3 g/dL (ref 0.0–0.4)
ALPHA-2-GLOBULIN: 1 g/dL (ref 0.4–1.0)
Beta Globulin: 1 g/dL (ref 0.7–1.3)
GAMMA GLOBULIN: 1.3 g/dL (ref 0.4–1.8)
Globulin, Total: 3.5 g/dL (ref 2.2–3.9)
Total Protein ELP: 7 g/dL (ref 6.0–8.5)

## 2016-10-20 LAB — CBC
HCT: 35 % — ABNORMAL LOW (ref 39.0–52.0)
HEMOGLOBIN: 11.9 g/dL — AB (ref 13.0–17.0)
MCH: 29.7 pg (ref 26.0–34.0)
MCHC: 34 g/dL (ref 30.0–36.0)
MCV: 87.3 fL (ref 78.0–100.0)
Platelets: 198 10*3/uL (ref 150–400)
RBC: 4.01 MIL/uL — AB (ref 4.22–5.81)
RDW: 13.5 % (ref 11.5–15.5)
WBC: 17.8 10*3/uL — ABNORMAL HIGH (ref 4.0–10.5)

## 2016-10-20 NOTE — Care Management Note (Signed)
Case Management Note  Patient Details  Name: Wesley Ingram MRN: [id number] Date of Birth: 08/07/1945  Subjective/Objective:      Rt ureteral  Stone with urosepsis per the md note           Action/Plan: Date:  Oct 20, 2016 Chart reviewed for concurrent status and case management needs. Will continue to follow patient progress. Discharge Planning: following for needs Expected discharge date: [id number] Velva Harman, BSN, [place], [place]  Expected Discharge Date:                  Expected Discharge Plan:  Home/Self Care  In-House Referral:     Discharge planning Services  CM Consult  Post Acute Care Choice:    Choice offered to:     DME Arranged:    DME Agency:     HH Arranged:    HH Agency:     Status of Service:  In process, will continue to follow  If discussed at Long Length of Stay Meetings, dates discussed:    Additional Comments:  Leeroy Cha, RN 10/20/2016, 8:17 AM

## 2016-10-20 NOTE — Progress Notes (Signed)
1 Day Post-Op   Subjective/Chief Complaint:   1- Right Ureteral Stone - 1cm rt proximal ureteral stone with severe hydro and delayed nephrogram by CT 10/2016 on eval flank pain and nausea / emesis. Stone is solitary, 1cm, SSD 13cm, 1100HU. Rt ureteral stent (6x26 contour) placed urgently 5/8.   2 - Likely Urosepsis - fevers, malaise, leukocytosis to 30k and significant right perinephric stranding worrisome for obsructing pyelo /sepsis. [place] 5/8 obtained and pending. Placed on empiric rocephin.  PMH sig for DM2, CVA with hemiparesis / Eliquus use.  Today " Wesley Ingram " is stable. UOP excellent. Fever curve stable / trending down.   Objective: Vital signs in last 24 hours: Temp:  [98 F (36.7 C)-100 F (37.8 C)] 99.7 F (37.6 C) (05/09 0607) Pulse Rate:  [88-101] 92 (05/09 0607) Resp:  [18-28] 22 (05/09 0000) BP: (129-159)/(54-95) 133/79 (05/09 0607) SpO2:  [95 %-100 %] 96 % (05/09 0607) Weight:  [74.8 kg (165 lb)] 74.8 kg (165 lb) (05/08 1409) Last BM Date: 10/18/16  Intake/Output from previous day: 05/08 0701 - 05/09 0700 In: 2700 [I.V.:700; IV Piggyback:2000] Out: 425 [Urine:425] Intake/Output this shift: No intake/output data recorded.  General appearance: alert, cooperative and sleepy Eyes: negative Nose: Nares normal. Septum midline. Mucosa normal. No drainage or sinus tenderness. Throat: lips, mucosa, and tongue normal; teeth and gums normal Neck: supple, symmetrical, trachea midline Back: symmetric, no curvature. ROM normal. No CVA tenderness. Resp: non-labored on minimal  O2 Cardio: regular tachycardia, improved some GI: soft, non-tender; bowel sounds normal; no masses,  no organomegaly Male genitalia: normal, foley in place with tea colored urine that is thin and non foul.  Extremities: stable UE contractures. Pulses: 2+ and symmetric Neurologic: Mental status: Alert, oriented, thought content appropriate, stable stigmata of partial hemiparesis.   Lab Results:    Recent Labs  10/19/16 1420 10/20/16 0612  WBC 29.9* 17.8*  HGB 14.3 11.9*  HCT 41.4 35.0*  PLT 277 198   BMET  Recent Labs  10/19/16 1420 10/20/16 0612  NA 135 135  K 4.1 3.7  CL 98* 104  CO2 24 21*  GLUCOSE 220* 158*  BUN 37* 34*  CREATININE 1.89* 1.69*  CALCIUM 9.6 7.9*   PT/INR  Recent Labs  10/19/16 2020  LABPROT 17.2*  INR 1.39   ABG No results for input(s): PHART, HCO3 in the last 72 hours.  Invalid input(s): PCO2, PO2  Studies/Results: Ct Abdomen Pelvis W Contrast  Result Date: 10/19/2016 CLINICAL DATA:  Abdominal pain and vomiting EXAM: CT ABDOMEN AND PELVIS WITH CONTRAST TECHNIQUE: Multidetector CT imaging of the abdomen and pelvis was performed using the standard protocol following bolus administration of intravenous contrast. CONTRAST:  70m ISOVUE-300 IOPAMIDOL (ISOVUE-300) INJECTION 61% COMPARISON:  None. FINDINGS: Lower chest: There is patchy bibasilar atelectasis. No consolidation in the lung bases. There are foci of atherosclerotic calcification. There is a small hiatal hernia. Hepatobiliary: No focal liver lesions are appreciable. Gallbladder wall is not appreciably thickened. There is no biliary duct dilatation. Pancreas: No pancreatic mass or inflammatory focus. Spleen: No splenic lesions are evident. There is a tiny splenule medial to the spleen anteriorly. 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 Left adrenal appears normal. There is no renal mass on either side. Right kidney is edematous with severe hydronephrosis. There is a calculus in the proximal right ureter immediately distal to the ureteropelvic junction measuring 1.0 x 0.8 cm. There is no intrarenal calculus on the right. On the left, there  is a 5 x 3 mm calculus in the lower pole left kidney, nonobstructing. There is no ureteral calculus on the left. The urinary bladder has a mildly thickened wall with an irregular contour. There are several small diverticula  arising from the urinary bladder. No mass is evident in the urinary bladder. Stomach/Bowel: Several loops of jejunum show mildly thickened walls. There is no appreciable bowel obstruction. No free air or portal venous air. There are occasional colonic diverticular without diverticulitis. Vascular/Lymphatic: There is atherosclerotic calcification in the aorta and common iliac arteries without evident aneurysm. Major mesenteric vessels appear patent. No adenopathy is appreciable in the abdomen or pelvis. Reproductive: Prostate and seminal vesicles appear unremarkable. No pelvic mass. Other: There is moderate fat in the right inguinal ring. There is evidence of previous inguinal hernia repairs bilaterally. There is a minimal ventral hernia containing only fat. Appendix appears unremarkable. There is no ascites or abscess in the abdomen or pelvis. Musculoskeletal: There are small lytic appearing lesions throughout the pelvis and proximal femurs. There is a sclerotic focus in the mid right iliac crest. Several small lucent lesions are noted in the lumbar spine. There is degenerative change in the lumbar spine. There is severe spinal stenosis at L4-5 due to diffuse disc protrusion and bony hypertrophy. No intramuscular or abdominal wall lesion evident. IMPRESSION: 1 x 0.8 cm calculus just distal to the right ureteropelvic junction with severe hydronephrosis on the right. There is right renal edema. Nonobstructing 5 x 3 mm calculus lower pole left kidney. Urinary bladder wall is irregular and thickened without well-defined mass. Several small diverticulum noted. Suspect cystitis. Given the irregularity of the urinary bladder wall, correlation with cystoscopy may well be advisable. Multiple small lytic appearing lesions are noted throughout the bony structures, concerning for underlying neoplasia/metastatic disease. Etiology uncertain. Sclerotic focus in the mid left iliac bone also noted. No similar sclerotic lesions  evident. Given the appearance of the bony structures, it may be prudent to obtain serum electrophoresis to assess for possible multiple myeloma. Several loops of prominent jejunum, likely due to early ileus or enteritis. No bowel obstruction evident. No evident focal liver lesions. Small hiatal hernia. Small ventral hernia containing only fat. Fat in the right inguinal ring. Patient has had inguinal hernia repairs bilaterally. Small benign right adrenal myelolipoma. Spinal stenosis, moderately severe at L4-5, multifactorial. Aortoiliac atherosclerosis.  Coronary artery calcification noted. Electronically Signed   By: Lowella Grip III M.D.   On: 10/19/2016 17:02   Dg Chest Port 1 View  Result Date: 10/19/2016 CLINICAL DATA:  Vomiting and sepsis EXAM: PORTABLE CHEST 1 VIEW COMPARISON:  Chest radiograph 12/19/2005 FINDINGS: Shallow lung inflation without focal consolidation or pulmonary edema. No pneumothorax or pleural effusion. Calcific aortic atherosclerosis but otherwise normal cardiomediastinal silhouette. IMPRESSION: Shallow lung inflation without evidence of pneumonia. Electronically Signed   By: Ulyses Jarred M.D.   On: 10/19/2016 21:27   Dg C-arm 1-60 Min-no Report  Result Date: 10/19/2016 Fluoroscopy was utilized by the requesting physician.  No radiographic interpretation.    Anti-infectives: Anti-infectives    Start     Dose/Rate Route Frequency Ordered Stop   10/19/16 2000  cefTRIAXone (ROCEPHIN) 2 g in dextrose 5 % 50 mL IVPB  Status:  Discontinued     2 g 100 mL/hr over 30 Minutes Intravenous  Once 10/19/16 1949 10/19/16 1955   10/19/16 2000  cefTRIAXone (ROCEPHIN) 1 g in dextrose 5 % 50 mL IVPB     1 g 100 mL/hr over 30  Minutes Intravenous Every 24 hours 10/19/16 1954        Assessment/Plan:  1- Right Ureteral Stone - now s/p decompression with stent. Will need ureteroscopy in elective setting after clears infectious parameters, certainly not this admission.   2 - Likely  Urosepsis - agree with current ABX pending further CX data.  Will follow, please call me directly with questions anytime.   [facility], Danah Reinecke 10/20/2016

## 2016-10-20 NOTE — Anesthesia Postprocedure Evaluation (Signed)
Anesthesia Post Note  Patient: Wesley Ingram  Procedure(s) Performed: Procedure(s) (LRB): CYSTOSCOPY WITH STENT PLACEMENT (Right)  Patient location during evaluation: PACU Anesthesia Type: General Level of consciousness: awake and alert Pain management: pain level controlled Vital Signs Assessment: post-procedure vital signs reviewed and stable Respiratory status: spontaneous breathing, nonlabored ventilation, respiratory function stable and patient connected to nasal cannula oxygen Cardiovascular status: blood pressure returned to baseline and stable Postop Assessment: no signs of nausea or vomiting Anesthetic complications: no       Last Vitals:  Vitals:   10/20/16 0000 10/20/16 0607  BP: (!) 141/75 133/79  Pulse: 88 92  Resp: (!) 22   Temp: 37.2 C 37.6 C    Last Pain:  Vitals:   10/19/16 2345  TempSrc:   PainSc: Asleep                 Marra Fraga S

## 2016-10-20 NOTE — Progress Notes (Signed)
PROGRESS NOTE    Wesley Ingram  ZOX:[id number] DOB: 02-Apr-1946 DOA: 10/19/2016 PCP: Cleta Alberts, MD    Brief Narrative: Wesley Ingram is a 71 y.o. male with medical history significant of remote CVA with resultant left hemiparesis; HTN; hypertriglyceridemia; and DM presenting with persistent n/v.  Started day prior to admission.  Pain in periumbilical region, continued to vomit through the day today.  BP 158/94 today, glucose 254.  Total emesis x 10-15 times, 3-4 times today.  Also with retching, heaves. Specifically denies urinary symptoms.  Mild RLQ pain.   ED Course: Tachycardia and elevated WBC count; CT with ileus without SBO; also with large right kidney stone with obstruction and hydronephrosis; also with multiple lytic lesions concerning for multiple myeloma.    Assessment & Plan:   Principal Problem:   Ileus (HCC) Active Problems:   DM (diabetes mellitus) (HCC)   HTN (hypertension)   Hyperlipidemia   History of CVA (cerebrovascular accident)   Nephrolithiasis   Lytic bone lesions on xray   Acute renal failure (ARF) (HCC)   Chronic anticoagulation   1-Nephrolithiasis, Acute renal failure, UTI;  CT scan; 1 x 0.8 cm obstructing stone with resultant severe right-sided hydronephrosis Underwent ureteral stent placement 5-08 by Dr Berneice Heinrich.  Renal function improving.  Continue with IV fluids, IV antibiotics.  Follow urine culture. Follow blood culture.   2-Sepsis, secondary to UTI; Presents with leukocytosis, fever, tachycardia. Source of infection UTI.  WBC has decreased to 17 from 29.  Continue with ceftriaxone. Follow culture.   3-Ileus, vs enteritis CT showed ileus vs enteritis.  Follow KUB today, if no evidence of obstruction will start clear.  He decline NG tube yesterday.  IV fluids.   4-Multiples Bone lesion on CT scan;  protein electrophoresis ordered.  Oncology consulted.   Chronic anticoagulation Unclear reason, ? Stroke.  Continue with eliquis  for now.   DM;  SSI. Hold oral medications.   HLD; continue with lipitor.   h/o CVA -Has residual left hemiparesis -He is taking BB and ARB and statin         DVT prophylaxis: Eliquis Code Status: DNR Family Communication: care discussed with patient Disposition Plan: remain inpatient for IV antibiotics.    Consultants:   Dr Clelia Croft, oncology  Urology, Sebastian Ache   Procedures:  Stent placement   Antimicrobials:   Ceftriaxone 5-08   Subjective: He denies abdominal pain, passing flatus.  Last BM two days ago.    Objective: Vitals:   10/19/16 2345 10/20/16 0000 10/20/16 0607 10/20/16 0954  BP: (!) 157/73 (!) 141/75 133/79 129/71  Pulse: 99 88 92 84  Resp: (!) 25 (!) 22    Temp:  99 F (37.2 C) 99.7 F (37.6 C)   TempSrc:      SpO2: 100% 100% 96%   Weight:      Height:        Intake/Output Summary (Last 24 hours) at 10/20/16 1256 Last data filed at 10/20/16 1100  Gross per 24 hour  Intake             2700 ml  Output              425 ml  Net             2275 ml   Filed Weights   10/19/16 1409  Weight: 74.8 kg (165 lb)    Examination:  General exam: Appears calm and comfortable , sitting in recliner.  Respiratory system: Clear to auscultation. Respiratory effort  normal. Cardiovascular system: S1 & S2 heard, RRR. No JVD, murmurs, rubs, gallops or clicks. No pedal edema. Gastrointestinal system: Abdomen is mildly distended, soft and nontender. No organomegaly or masses felt. Normal bowel sounds heard. Central nervous system: Alert and oriented. No focal neurological deficits. Extremities: Symmetric 5 x 5 power. Skin: No rashes, lesions or ulcers     Data Reviewed: I have personally reviewed following labs and imaging studies  CBC:  Recent Labs Lab 10/19/16 1420 10/20/16 0612  WBC 29.9* 17.8*  HGB 14.3 11.9*  HCT 41.4 35.0*  MCV 86.4 87.3  PLT 277 198   Basic Metabolic Panel:  Recent Labs Lab 10/19/16 1420  10/20/16 0612  NA 135 135  K 4.1 3.7  CL 98* 104  CO2 24 21*  GLUCOSE 220* 158*  BUN 37* 34*  CREATININE 1.89* 1.69*  CALCIUM 9.6 7.9*   GFR: Estimated Creatinine Clearance: 42.6 mL/min (A) (by C-G formula based on SCr of 1.69 mg/dL (H)). Liver Function Tests:  Recent Labs Lab 10/19/16 1420  AST 25  ALT 17  ALKPHOS 52  BILITOT 0.9  PROT 8.1  ALBUMIN 4.3    Recent Labs Lab 10/19/16 1420  LIPASE 30   No results for input(s): AMMONIA in the last 168 hours. Coagulation Profile:  Recent Labs Lab 10/19/16 2020  INR 1.39   Cardiac Enzymes: No results for input(s): CKTOTAL, CKMB, CKMBINDEX, TROPONINI in the last 168 hours. BNP (last 3 results) No results for input(s): PROBNP in the last 8760 hours. HbA1C: No results for input(s): HGBA1C in the last 72 hours. CBG:  Recent Labs Lab 10/19/16 2029 10/19/16 2337 10/20/16 0553 10/20/16 0725 10/20/16 1156  GLUCAP 177* 167* 150* 151* 132*   Lipid Profile: No results for input(s): CHOL, HDL, LDLCALC, TRIG, CHOLHDL, LDLDIRECT in the last 72 hours. Thyroid Function Tests: No results for input(s): TSH, T4TOTAL, FREET4, T3FREE, THYROIDAB in the last 72 hours. Anemia Panel: No results for input(s): VITAMINB12, FOLATE, FERRITIN, TIBC, IRON, RETICCTPCT in the last 72 hours. Sepsis Labs:  Recent Labs Lab 10/19/16 1539 10/19/16 1752 10/19/16 2020 10/19/16 2206  PROCALCITON  --   --  0.17  --   LATICACIDVEN 2.9* 2.4* 1.8 1.5    Recent Results (from the past 240 hour(s))  Culture, blood (x 2)     Status: None (Preliminary result)   Collection Time: 10/19/16  8:15 PM  Result Value Ref Range Status   Specimen Description BLOOD RIGHT ANTECUBITAL  Final   Special Requests IN PEDIATRIC BOTTLE Blood Culture adequate volume  Final   Culture   Final    NO GROWTH < 12 HOURS Performed at [facility], [address], [place], [place] [address]    Report Status PENDING  Incomplete  Surgical pcr screen     Status:  None   Collection Time: 10/19/16 10:01 PM  Result Value Ref Range Status   MRSA, PCR NEGATIVE NEGATIVE Final   Staphylococcus aureus NEGATIVE NEGATIVE Final    Comment:        The Xpert SA Assay (FDA approved for NASAL specimens in patients over 30 years of age), is one component of a comprehensive surveillance program.  Test performance has been validated by [facility] for patients greater than or equal to 61 year old. It is not intended to diagnose infection nor to guide or monitor treatment.          Radiology Studies: Ct Abdomen Pelvis W Contrast  Result Date: 10/19/2016 CLINICAL DATA:  Abdominal pain and vomiting EXAM: CT ABDOMEN AND PELVIS WITH CONTRAST TECHNIQUE: Multidetector CT imaging of the abdomen and pelvis was performed using the standard protocol following bolus administration of intravenous contrast. CONTRAST:  75mL ISOVUE-300 IOPAMIDOL (ISOVUE-300) INJECTION 61% COMPARISON:  None. FINDINGS: Lower chest: There is patchy bibasilar atelectasis. No consolidation in the lung bases. There are foci of atherosclerotic calcification. There is a small hiatal hernia. Hepatobiliary: No focal liver lesions are appreciable. Gallbladder wall is not appreciably thickened. There is no biliary duct dilatation. Pancreas: No pancreatic mass or inflammatory focus. Spleen: No splenic lesions are evident. There is a tiny splenule medial to the spleen anteriorly. 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 Left adrenal appears normal. There is no renal mass on either side. Right kidney is edematous with severe hydronephrosis. There is a calculus in the proximal right ureter immediately distal to the ureteropelvic junction measuring 1.0 x 0.8 cm. There is no intrarenal calculus on the right. On the left, there is a 5 x 3 mm calculus in the lower pole left kidney, nonobstructing. There is no ureteral calculus on the left. The urinary bladder has a mildly thickened  wall with an irregular contour. There are several small diverticula arising from the urinary bladder. No mass is evident in the urinary bladder. Stomach/Bowel: Several loops of jejunum show mildly thickened walls. There is no appreciable bowel obstruction. No free air or portal venous air. There are occasional colonic diverticular without diverticulitis. Vascular/Lymphatic: There is atherosclerotic calcification in the aorta and common iliac arteries without evident aneurysm. Major mesenteric vessels appear patent. No adenopathy is appreciable in the abdomen or pelvis. Reproductive: Prostate and seminal vesicles appear unremarkable. No pelvic mass. Other: There is moderate fat in the right inguinal ring. There is evidence of previous inguinal hernia repairs bilaterally. There is a minimal ventral hernia containing only fat. Appendix appears unremarkable. There is no ascites or abscess in the abdomen or pelvis. Musculoskeletal: There are small lytic appearing lesions throughout the pelvis and proximal femurs. There is a sclerotic focus in the mid right iliac crest. Several small lucent lesions are noted in the lumbar spine. There is degenerative change in the lumbar spine. There is severe spinal stenosis at L4-5 due to diffuse disc protrusion and bony hypertrophy. No intramuscular or abdominal wall lesion evident. IMPRESSION: 1 x 0.8 cm calculus just distal to the right ureteropelvic junction with severe hydronephrosis on the right. There is right renal edema. Nonobstructing 5 x 3 mm calculus lower pole left kidney. Urinary bladder wall is irregular and thickened without well-defined mass. Several small diverticulum noted. Suspect cystitis. Given the irregularity of the urinary bladder wall, correlation with cystoscopy may well be advisable. Multiple small lytic appearing lesions are noted throughout the bony structures, concerning for underlying neoplasia/metastatic disease. Etiology uncertain. Sclerotic focus in  the mid left iliac bone also noted. No similar sclerotic lesions evident. Given the appearance of the bony structures, it may be prudent to obtain serum electrophoresis to assess for possible multiple myeloma. Several loops of prominent jejunum, likely due to early ileus or enteritis. No bowel obstruction evident. No evident focal liver lesions. Small hiatal hernia. Small ventral hernia containing only fat. Fat in the right inguinal ring. Patient has had inguinal hernia repairs bilaterally. Small benign right adrenal myelolipoma. Spinal stenosis, moderately severe at L4-5, multifactorial. Aortoiliac atherosclerosis.  Coronary artery calcification noted. Electronically Signed   By: Bretta Bang III M.D.   On: 10/19/2016 17:02  Dg Chest Port 1 View  Result Date: 10/19/2016 CLINICAL DATA:  Vomiting and sepsis EXAM: PORTABLE CHEST 1 VIEW COMPARISON:  Chest radiograph 12/19/2005 FINDINGS: Shallow lung inflation without focal consolidation or pulmonary edema. No pneumothorax or pleural effusion. Calcific aortic atherosclerosis but otherwise normal cardiomediastinal silhouette. IMPRESSION: Shallow lung inflation without evidence of pneumonia. Electronically Signed   By: Deatra Robinson M.D.   On: 10/19/2016 21:27   Dg C-arm 1-60 Min-no Report  Result Date: 10/19/2016 Fluoroscopy was utilized by the requesting physician.  No radiographic interpretation.        Scheduled Meds: . apixaban  5 mg Oral BID  . atorvastatin  10 mg Oral QHS  . baclofen  10 mg Oral Daily  . chlorhexidine  15 mL Mouth Rinse BID  . gabapentin  200 mg Oral [id number]  . insulin aspart  0-15 Units Subcutaneous TID WC  . insulin aspart  0-5 Units Subcutaneous QHS  . mouth rinse  15 mL Mouth Rinse q12n4p  . metoprolol succinate  12.5 mg Oral Daily  . omega-3 acid ethyl esters  1 g Oral Daily   Continuous Infusions: . sodium chloride 125 mL/hr at 10/20/16 0945  . cefTRIAXone (ROCEPHIN)  IV Stopped (10/19/16 2108)     LOS: 1  day    Time spent: 35 minutes.     Alba Cory, MD [facility] Pager [phone]  If 7PM-7AM, please contact night-coverage [email or web address] Password [email or web address] 10/20/2016, 12:56 PM

## 2016-10-20 NOTE — Consult Note (Signed)
Reason for Referral: Lytic bone lesions.   HPI: 71 year old gentleman with history of diabetes, hypertension and history of stroke. He presented to the emergency department on 10/19/2016 with nausea and vomiting and presumed urosepsis. CT scan of the abdomen and pelvis which showed a solitary kidney stone measuring 1 cm at the right ureter. He also was noted to have significant right perinephric stranding worrisome for obstructive sepsis. The CT scan on May 2018 also showed small lytic-appearing lesions throughout the pelvis and the proximal femurs. There is sclerotic foci in the right iliac crest and small lucencies in the lumbar spine. There is some degenerative changes was also noted. Serum protein electrophoresis was recommended which is currently pending. He underwent a cystoscopy and a right ureteral stent by Dr. Tresa Moore on 10/19/2016.  Clinically, he reports no specific symptoms to suggest bone pain or pathological fractures.   He denied any headaches, blurry vision or seizures. He does not report any fevers or chills or sweats. He does not report any cough, wheezing or hemoptysis. He does not report any nausea or vomiting or abdominal pain. He does not report any frequency urgency or hesitancy. He does not report any skeletal complaints. Remaining review of systems unremarkable.   Past Medical History:  Diagnosis Date  . Diabetes mellitus   . High triglycerides   . Hypertension   . Renal disorder   . Stroke [facility]) 2008   left hemiparesis  :  Past Surgical History:  Procedure Laterality Date  . CYSTOSCOPY WITH STENT PLACEMENT Right 10/19/2016   Procedure: CYSTOSCOPY WITH STENT PLACEMENT;  Surgeon: Alexis Frock, MD;  Location: [facility];  Service: Urology;  Laterality: Right;  . HERNIA REPAIR     bilateral inguinal  . TOE AMPUTATION    . TONSILLECTOMY    :   Current Facility-Administered Medications:  .  0.9 %  sodium chloride infusion, , Intravenous, Continuous, Regalado, Belkys A,  MD, Last Rate: 100 mL/hr at 10/20/16 1257 .  acetaminophen (TYLENOL) tablet 650 mg, 650 mg, Oral, Q6H PRN **OR** acetaminophen (TYLENOL) suppository 650 mg, 650 mg, Rectal, Q6H PRN, Karmen Bongo, MD .  apixaban Arne Cleveland) tablet 5 mg, 5 mg, Oral, BID, Karmen Bongo, MD, 5 mg at 10/20/16 0959 .  atorvastatin (LIPITOR) tablet 10 mg, 10 mg, Oral, QHS, Karmen Bongo, MD, 10 mg at 10/19/16 2039 .  baclofen (LIORESAL) tablet 10 mg, 10 mg, Oral, Daily, Karmen Bongo, MD, 10 mg at 10/20/16 0959 .  cefTRIAXone (ROCEPHIN) 1 g in dextrose 5 % 50 mL IVPB, 1 g, Intravenous, Q24H, Karmen Bongo, MD, Stopped at 10/19/16 2108 .  chlorhexidine (PERIDEX) 0.12 % solution 15 mL, 15 mL, Mouth Rinse, BID, Karmen Bongo, MD, 15 mL at 10/20/16 1000 .  gabapentin (NEURONTIN) capsule 200 mg, 200 mg, Oral, Ledell Noss, MD, 200 mg at 10/20/16 0847 .  insulin aspart (novoLOG) injection 0-15 Units, 0-15 Units, Subcutaneous, TID WC, Karmen Bongo, MD, 2 Units at 10/20/16 1231 .  insulin aspart (novoLOG) injection 0-5 Units, 0-5 Units, Subcutaneous, QHS, Karmen Bongo, MD .  MEDLINE mouth rinse, 15 mL, Mouth Rinse, q12n4p, Karmen Bongo, MD, 15 mL at 10/20/16 1231 .  metoprolol succinate (TOPROL-XL) 24 hr tablet 12.5 mg, 12.5 mg, Oral, Daily, Karmen Bongo, MD, 12.5 mg at 10/20/16 0958 .  omega-3 acid ethyl esters (LOVAZA) capsule 1 g, 1 g, Oral, Daily, Karmen Bongo, MD, 1 g at 10/20/16 0954 .  ondansetron (ZOFRAN) tablet 4 mg, 4 mg, Oral, Q6H PRN **OR** ondansetron (ZOFRAN) injection 4  mg, 4 mg, Intravenous, Q6H PRN, Karmen Bongo, MD .  promethazine (PHENERGAN) injection 12.5 mg, 12.5 mg, Intravenous, Q6H PRN, Karmen Bongo, MD:  No Known Allergies:  Family History  Problem Relation Age of Onset  . Multiple myeloma Mother 59  :  Social History   Social History  . Marital status: Married    Spouse name: N/A  . Number of children: N/A  . Years of education: N/A   Occupational  History  . disabled    Social History Main Topics  . Smoking status: Never Smoker  . Smokeless tobacco: Never Used  . Alcohol use No  . Drug use: No  . Sexual activity: Not on file   Other Topics Concern  . Not on file   Social History Narrative  . No narrative on file  :  Pertinent items are noted in HPI.  Exam: Blood pressure (!) 113/56, pulse 79, temperature 99.4 F (37.4 C), temperature source Oral, resp. rate 20, height '5\' 10"'  (1.778 m), weight 165 lb (74.8 kg), SpO2 95 %. General appearance: Lethargic but responsive. Throat: Oral thrush. Neck: no adenopathy Back: negative Resp: clear to auscultation bilaterally Chest wall: no tenderness Cardio: regular rate and rhythm, S1, S2 normal, no murmur, click, rub or gallop GI: soft, non-tender; bowel sounds normal; no masses,  no organomegaly Extremities: extremities normal, atraumatic, no cyanosis or edema Pulses: 2+ and symmetric Lymph nodes: Cervical, supraclavicular, and axillary nodes normal.   Recent Labs  10/19/16 1420 10/20/16 0612  WBC 29.9* 17.8*  HGB 14.3 11.9*  HCT 41.4 35.0*  PLT 277 198    Recent Labs  10/19/16 1420 10/20/16 0612  NA 135 135  K 4.1 3.7  CL 98* 104  CO2 24 21*  GLUCOSE 220* 158*  BUN 37* 34*  CREATININE 1.89* 1.69*  CALCIUM 9.6 7.9*    Ct Abdomen Pelvis W Contrast  Result Date: 10/19/2016 CLINICAL DATA:  Abdominal pain and vomiting EXAM: CT ABDOMEN AND PELVIS WITH CONTRAST TECHNIQUE: Multidetector CT imaging of the abdomen and pelvis was performed using the standard protocol following bolus administration of intravenous contrast. CONTRAST:  59m ISOVUE-300 IOPAMIDOL (ISOVUE-300) INJECTION 61% COMPARISON:  None. FINDINGS: Lower chest: There is patchy bibasilar atelectasis. No consolidation in the lung bases. There are foci of atherosclerotic calcification. There is a small hiatal hernia. Hepatobiliary: No focal liver lesions are appreciable. Gallbladder wall is not appreciably  thickened. There is no biliary duct dilatation. Pancreas: No pancreatic mass or inflammatory focus. Spleen: No splenic lesions are evident. There is a tiny splenule medial to the spleen anteriorly. 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 Left adrenal appears normal. There is no renal mass on either side. Right kidney is edematous with severe hydronephrosis. There is a calculus in the proximal right ureter immediately distal to the ureteropelvic junction measuring 1.0 x 0.8 cm. There is no intrarenal calculus on the right. On the left, there is a 5 x 3 mm calculus in the lower pole left kidney, nonobstructing. There is no ureteral calculus on the left. The urinary bladder has a mildly thickened wall with an irregular contour. There are several small diverticula arising from the urinary bladder. No mass is evident in the urinary bladder. Stomach/Bowel: Several loops of jejunum show mildly thickened walls. There is no appreciable bowel obstruction. No free air or portal venous air. There are occasional colonic diverticular without diverticulitis. Vascular/Lymphatic: There is atherosclerotic calcification in the aorta and common iliac arteries without evident  aneurysm. Major mesenteric vessels appear patent. No adenopathy is appreciable in the abdomen or pelvis. Reproductive: Prostate and seminal vesicles appear unremarkable. No pelvic mass. Other: There is moderate fat in the right inguinal ring. There is evidence of previous inguinal hernia repairs bilaterally. There is a minimal ventral hernia containing only fat. Appendix appears unremarkable. There is no ascites or abscess in the abdomen or pelvis. Musculoskeletal: There are small lytic appearing lesions throughout the pelvis and proximal femurs. There is a sclerotic focus in the mid right iliac crest. Several small lucent lesions are noted in the lumbar spine. There is degenerative change in the lumbar spine. There is severe  spinal stenosis at L4-5 due to diffuse disc protrusion and bony hypertrophy. No intramuscular or abdominal wall lesion evident. IMPRESSION: 1 x 0.8 cm calculus just distal to the right ureteropelvic junction with severe hydronephrosis on the right. There is right renal edema. Nonobstructing 5 x 3 mm calculus lower pole left kidney. Urinary bladder wall is irregular and thickened without well-defined mass. Several small diverticulum noted. Suspect cystitis. Given the irregularity of the urinary bladder wall, correlation with cystoscopy may well be advisable. Multiple small lytic appearing lesions are noted throughout the bony structures, concerning for underlying neoplasia/metastatic disease. Etiology uncertain. Sclerotic focus in the mid left iliac bone also noted. No similar sclerotic lesions evident. Given the appearance of the bony structures, it may be prudent to obtain serum electrophoresis to assess for possible multiple myeloma. Several loops of prominent jejunum, likely due to early ileus or enteritis. No bowel obstruction evident. No evident focal liver lesions. Small hiatal hernia. Small ventral hernia containing only fat. Fat in the right inguinal ring. Patient has had inguinal hernia repairs bilaterally. Small benign right adrenal myelolipoma. Spinal stenosis, moderately severe at L4-5, multifactorial. Aortoiliac atherosclerosis.  Coronary artery calcification noted. Electronically Signed   By: Lowella Grip III M.D.   On: 10/19/2016 17:02     Assessment and Plan:    71 year old gentleman with the following issues:  1. Small lytic lesions noted in the pelvic and spine detected on CT scan on May 2018. These findings could suggest multiple myeloma versus metastatic malignancy. Other benign findings are also considered at this time. His laboratory data showed a mild anemia and chronic renal insufficiency in the setting of acute illness and ureteral sepsis. His calcium is normal with normal  total protein and albumin.  I agree with obtaining serum protein electrophoresis at this time and I do not recommend further workup as an inpatient. I believe these are incidental findings that will be worked up further as an outpatient basis once his acute illness has resolved. I do not believe this finding is contributing to his acute illness urosepsis or nausea and vomiting.  We will follow his labs and arrange follow-up an outpatient basis which will include possibly repeating skeletal survey and possible bone marrow biopsy if multiple myeloma is suggested.  2. Urosepsis: He is status post cystoscopy and stent placement. This could explain his renal insufficiency rather than a plasma cell disorder. He is currently on intravenous antibiotics for that.  We'll arrange oncology follow-up upon his discharge for follow-up regarding these lesions.

## 2016-10-21 DIAGNOSIS — K567 Ileus, unspecified: Secondary | ICD-10-CM | POA: Diagnosis not present

## 2016-10-21 LAB — CBC
HEMATOCRIT: 35.1 % — AB (ref 39.0–52.0)
HEMOGLOBIN: 11.7 g/dL — AB (ref 13.0–17.0)
MCH: 29.5 pg (ref 26.0–34.0)
MCHC: 33.3 g/dL (ref 30.0–36.0)
MCV: 88.4 fL (ref 78.0–100.0)
Platelets: 182 10*3/uL (ref 150–400)
RBC: 3.97 MIL/uL — ABNORMAL LOW (ref 4.22–5.81)
RDW: 13.5 % (ref 11.5–15.5)
WBC: 10.1 10*3/uL (ref 4.0–10.5)

## 2016-10-21 LAB — BLOOD CULTURE ID PANEL (REFLEXED)
Acinetobacter baumannii: NOT DETECTED
CANDIDA GLABRATA: NOT DETECTED
CANDIDA KRUSEI: NOT DETECTED
Candida albicans: NOT DETECTED
Candida parapsilosis: NOT DETECTED
Candida tropicalis: NOT DETECTED
ESCHERICHIA COLI: NOT DETECTED
Enterobacter cloacae complex: NOT DETECTED
Enterobacteriaceae species: NOT DETECTED
Enterococcus species: NOT DETECTED
Haemophilus influenzae: NOT DETECTED
Klebsiella oxytoca: NOT DETECTED
Klebsiella pneumoniae: NOT DETECTED
LISTERIA MONOCYTOGENES: NOT DETECTED
METHICILLIN RESISTANCE: DETECTED — AB
Neisseria meningitidis: NOT DETECTED
Proteus species: NOT DETECTED
Pseudomonas aeruginosa: NOT DETECTED
SERRATIA MARCESCENS: NOT DETECTED
STREPTOCOCCUS PNEUMONIAE: NOT DETECTED
Staphylococcus aureus (BCID): NOT DETECTED
Staphylococcus species: DETECTED — AB
Streptococcus agalactiae: NOT DETECTED
Streptococcus pyogenes: NOT DETECTED
Streptococcus species: NOT DETECTED

## 2016-10-21 LAB — BASIC METABOLIC PANEL
Anion gap: 7 (ref 5–15)
BUN: 22 mg/dL — ABNORMAL HIGH (ref 6–20)
CALCIUM: 7.9 mg/dL — AB (ref 8.9–10.3)
CHLORIDE: 106 mmol/L (ref 101–111)
CO2: 25 mmol/L (ref 22–32)
CREATININE: 1.11 mg/dL (ref 0.61–1.24)
GFR calc Af Amer: >60 mL/min (ref >60)
GFR calc non Af Amer: >60 mL/min (ref >60)
GLUCOSE: 164 mg/dL — AB (ref 65–99)
Potassium: 3.6 mmol/L (ref 3.5–5.1)
Sodium: 138 mmol/L (ref 135–145)

## 2016-10-21 LAB — GLUCOSE, CAPILLARY
GLUCOSE-CAPILLARY: 266 mg/dL — AB (ref 65–99)
Glucose-Capillary: 243 mg/dL — ABNORMAL HIGH (ref 65–99)
Glucose-Capillary: 195 mg/dL — ABNORMAL HIGH (ref 65–99)
Glucose-Capillary: 251 mg/dL — ABNORMAL HIGH (ref 65–99)

## 2016-10-21 LAB — HEMOGLOBIN A1C
Hgb A1c MFr Bld: 6.6 % — ABNORMAL HIGH (ref 4.8–5.6)
MEAN PLASMA GLUCOSE: 143 mg/dL

## 2016-10-21 MED ORDER — POLYETHYLENE GLYCOL 3350 17 G PO PACK
17.0000 g | PACK | Freq: Every day | ORAL | Status: DC
  Administered 2016-10-21 – 2016-10-25 (×4): 17 g via ORAL
  Filled 2016-10-21 (×5): qty 1

## 2016-10-21 MED ORDER — VANCOMYCIN HCL IN DEXTROSE 1-5 GM/200ML-% IV SOLN
1000.0000 mg | Freq: Two times a day (BID) | INTRAVENOUS | Status: DC
  Administered 2016-10-21 – 2016-10-24 (×7): 1000 mg via INTRAVENOUS
  Filled 2016-10-21 (×6): qty 200

## 2016-10-21 MED ORDER — AMLODIPINE BESYLATE 5 MG PO TABS
2.5000 mg | ORAL_TABLET | Freq: Every day | ORAL | Status: DC
  Administered 2016-10-21 – 2016-10-26 (×6): 2.5 mg via ORAL
  Filled 2016-10-21 (×6): qty 1

## 2016-10-21 NOTE — Progress Notes (Signed)
Pharmacy Antibiotic Note  Wesley Ingram is a 71 y.o. male admitted on 10/19/2016 with bacteremia.  Pharmacy has been consulted for Vancomycin dosing.  Plan: Vancomycin 1gm IV every 12 hours.  Goal trough 15-20 mcg/mL.  Height: 5\' 10"  (177.8 cm) Weight: 165 lb (74.8 kg) IBW/kg (Calculated) : 73  Temp (24hrs), Avg:99.2 F (37.3 C), Min:98.5 F (36.9 C), Max:99.7 F (37.6 C)   Recent Labs Lab 10/19/16 1420 10/19/16 1539 10/19/16 1752 10/19/16 2020 10/19/16 2206 10/20/16 0612 10/21/16 0539  WBC 29.9*  --   --   --   --  17.8* 10.1  CREATININE 1.89*  --   --   --   --  1.69* 1.11  LATICACIDVEN  --  2.9* 2.4* 1.8 1.5  --   --     Estimated Creatinine Clearance: 64.9 mL/min (by C-G formula based on SCr of 1.11 mg/dL).    No Known Allergies  Antimicrobials this admission: Vancomycin 10/21/2016 >> Ceftriaxone  10/21/2016 >>   Dose adjustments this admission: pending  Microbiology results: pending  Thank you for allowing pharmacy to be a part of this patient's care.  Nani Skillern Crowford 10/21/2016 6:57 AM

## 2016-10-21 NOTE — Progress Notes (Signed)
PROGRESS NOTE    Hatim Kuecker  ZOX:[id number] DOB: 1946/05/07 DOA: 10/19/2016 PCP: Cleta Alberts, MD    Brief Narrative: Wesley Ingram is a 71 y.o. male with medical history significant of remote CVA with resultant left hemiparesis; HTN; hypertriglyceridemia; and DM presenting with persistent n/v.  Started day prior to admission.  Pain in periumbilical region, continued to vomit through the day today.  BP 158/94 today, glucose 254.  Total emesis x 10-15 times, 3-4 times today.  Also with retching, heaves. Specifically denies urinary symptoms.  Mild RLQ pain.   ED Course: Tachycardia and elevated WBC count; CT with ileus without SBO; also with large right kidney stone with obstruction and hydronephrosis; also with multiple lytic lesions concerning for multiple myeloma.    Assessment & Plan:   Principal Problem:   Ileus (HCC) Active Problems:   DM (diabetes mellitus) (HCC)   HTN (hypertension)   Hyperlipidemia   History of CVA (cerebrovascular accident)   Nephrolithiasis   Lytic bone lesions on xray   Acute renal failure (ARF) (HCC)   Chronic anticoagulation   1-Nephrolithiasis, Acute renal failure, UTI;  CT scan; 1 x 0.8 cm obstructing stone with resultant severe right-sided hydronephrosis Underwent ureteral stent placement 5-08 by Dr Berneice Heinrich.  Renal function improving.  Continue with IV fluids, IV antibiotics.  Urine culture; growing staphylococcus epidermidis.   Bacteremia, staphylococcus ; Methicillin resistant. Await final culture results. Continue with Vancomycin.  Will need to repeat Blood culture in 24 hours.   2-Sepsis, secondary to UTI; Presents with leukocytosis, fever, tachycardia. Source of infection UTI.  WBC has decreased to 17 from 29.  Blood culture positive for staph. started on vancomycin.    3-Ileus, vs enteritis CT showed ileus vs enteritis.  KUB negative, he is tolerating clear diet.  IV fluids.   4-Multiples Bone lesion on CT scan;    protein electrophoresis negative for M spike.  Oncology consulted. Appreciate Dr Clelia Croft help. Patient will need outpatient follow up.   HTN;  Continue with metoprolol. Start low dose Norvasc.  Holding ARB due to AKI.   Chronic anticoagulation Unclear reason, ? Stroke.  Continue with eliquis for now.   DM;  SSI. Hold oral medications.   HLD; continue with lipitor.   h/o CVA -Has residual left hemiparesis -He is taking BB and ARB and statin         DVT prophylaxis: Eliquis Code Status: DNR Family Communication: care discussed with patient Disposition Plan: remain inpatient for IV antibiotics.    Consultants:   Dr Clelia Croft, oncology  Urology, Sebastian Ache   Procedures:  Stent placement   Antimicrobials:   Ceftriaxone 5-08   Subjective: Tolerating clear diet. Passing gas. Wants to  Eat  stakes.    Objective: Vitals:   10/20/16 1352 10/20/16 2120 10/21/16 0538 10/21/16 1338  BP: (!) 113/56 (!) 161/75 (!) 148/71 (!) 157/84  Pulse: 79 (!) 102 88 83  Resp: 20 20 20 20   Temp: 99.4 F (37.4 C) 99.7 F (37.6 C) 98.5 F (36.9 C) 97.8 F (36.6 C)  TempSrc: Oral Oral Oral Oral  SpO2: 95% 99% 98% 98%  Weight:      Height:        Intake/Output Summary (Last 24 hours) at 10/21/16 1359 Last data filed at 10/21/16 1000  Gross per 24 hour  Intake          1553.75 ml  Output             1750 ml  Net          -  196.25 ml   Filed Weights   10/19/16 1409  Weight: 74.8 kg (165 lb)    Examination:  General exam: NAD, lying down in bed.  Respiratory system: CTA Cardiovascular system: S 1, S 2 RRR.  gallops or clicks. No pedal edema. Gastrointestinal system: soft, nt, nd Central nervous system: Alert and oriented. Chronic left hemiparesis.  Extremities: Symmetric 5 x 5 power. Skin: No rashes, lesions or ulcers     Data Reviewed: I have personally reviewed following labs and imaging studies  CBC:  Recent Labs Lab 10/19/16 1420 10/20/16 0612  10/21/16 0539  WBC 29.9* 17.8* 10.1  HGB 14.3 11.9* 11.7*  HCT 41.4 35.0* 35.1*  MCV 86.4 87.3 88.4  PLT 277 198 182   Basic Metabolic Panel:  Recent Labs Lab 10/19/16 1420 10/20/16 0612 10/21/16 0539  NA 135 135 138  K 4.1 3.7 3.6  CL 98* 104 106  CO2 24 21* 25  GLUCOSE 220* 158* 164*  BUN 37* 34* 22*  CREATININE 1.89* 1.69* 1.11  CALCIUM 9.6 7.9* 7.9*   GFR: Estimated Creatinine Clearance: 64.9 mL/min (by C-G formula based on SCr of 1.11 mg/dL). Liver Function Tests:  Recent Labs Lab 10/19/16 1420  AST 25  ALT 17  ALKPHOS 52  BILITOT 0.9  PROT 8.1  ALBUMIN 4.3    Recent Labs Lab 10/19/16 1420  LIPASE 30   No results for input(s): AMMONIA in the last 168 hours. Coagulation Profile:  Recent Labs Lab 10/19/16 2020  INR 1.39   Cardiac Enzymes: No results for input(s): CKTOTAL, CKMB, CKMBINDEX, TROPONINI in the last 168 hours. BNP (last 3 results) No results for input(s): PROBNP in the last 8760 hours. HbA1C:  Recent Labs  10/19/16 2211  HGBA1C 6.6*   CBG:  Recent Labs Lab 10/20/16 1156 10/20/16 1701 10/20/16 2124 10/21/16 0735 10/21/16 1128  GLUCAP 132* 113* 188* 243* 195*   Lipid Profile: No results for input(s): CHOL, HDL, LDLCALC, TRIG, CHOLHDL, LDLDIRECT in the last 72 hours. Thyroid Function Tests: No results for input(s): TSH, T4TOTAL, FREET4, T3FREE, THYROIDAB in the last 72 hours. Anemia Panel: No results for input(s): VITAMINB12, FOLATE, FERRITIN, TIBC, IRON, RETICCTPCT in the last 72 hours. Sepsis Labs:  Recent Labs Lab 10/19/16 1539 10/19/16 1752 10/19/16 2020 10/19/16 2206  PROCALCITON  --   --  0.17  --   LATICACIDVEN 2.9* 2.4* 1.8 1.5    Recent Results (from the past 240 hour(s))  Culture, blood (x 2)     Status: None (Preliminary result)   Collection Time: 10/19/16  8:15 PM  Result Value Ref Range Status   Specimen Description BLOOD RIGHT ANTECUBITAL  Final   Special Requests IN PEDIATRIC BOTTLE Blood  Culture adequate volume  Final   Culture  Setup Time   Final    GRAM POSITIVE COCCI IN PEDIATRIC BOTTLE CRITICAL RESULT CALLED TO, READ BACK BY AND VERIFIED WITH: Peggyann Juba PHARMD, AT 1610 10/21/16 BY D. VANHOOK    Culture   Final    GRAM POSITIVE COCCI CULTURE REINCUBATED FOR BETTER GROWTH Performed at [facility], [address], [place], [place] [address]    Report Status PENDING  Incomplete  Blood Culture ID Panel (Reflexed)     Status: Abnormal   Collection Time: 10/19/16  8:15 PM  Result Value Ref Range Status   Enterococcus species NOT DETECTED NOT DETECTED Final   Listeria monocytogenes NOT DETECTED NOT DETECTED Final   Staphylococcus species DETECTED (A) NOT DETECTED Final  Comment: Methicillin (oxacillin) resistant coagulase negative staphylococcus. Possible blood culture contaminant (unless isolated from more than one blood culture draw or clinical case suggests pathogenicity). No antibiotic treatment is indicated for blood  culture contaminants. CRITICAL RESULT CALLED TO, READ BACK BY AND VERIFIED WITH: JIllene Bolus PHARMD, AT 1027 10/21/16 BY D. VANHOOK    Staphylococcus aureus NOT DETECTED NOT DETECTED Final   Methicillin resistance DETECTED (A) NOT DETECTED Final    Comment: CRITICAL RESULT CALLED TO, READ BACK BY AND VERIFIED WITH: J. GRIMSLEY PHARMD, AT 2536 10/21/16 BY D. VANHOOK    Streptococcus species NOT DETECTED NOT DETECTED Final   Streptococcus agalactiae NOT DETECTED NOT DETECTED Final   Streptococcus pneumoniae NOT DETECTED NOT DETECTED Final   Streptococcus pyogenes NOT DETECTED NOT DETECTED Final   Acinetobacter baumannii NOT DETECTED NOT DETECTED Final   Enterobacteriaceae species NOT DETECTED NOT DETECTED Final   Enterobacter cloacae complex NOT DETECTED NOT DETECTED Final   Escherichia coli NOT DETECTED NOT DETECTED Final   Klebsiella oxytoca NOT DETECTED NOT DETECTED Final   Klebsiella pneumoniae NOT DETECTED NOT DETECTED Final   Proteus  species NOT DETECTED NOT DETECTED Final   Serratia marcescens NOT DETECTED NOT DETECTED Final   Haemophilus influenzae NOT DETECTED NOT DETECTED Final   Neisseria meningitidis NOT DETECTED NOT DETECTED Final   Pseudomonas aeruginosa NOT DETECTED NOT DETECTED Final   Candida albicans NOT DETECTED NOT DETECTED Final   Candida glabrata NOT DETECTED NOT DETECTED Final   Candida krusei NOT DETECTED NOT DETECTED Final   Candida parapsilosis NOT DETECTED NOT DETECTED Final   Candida tropicalis NOT DETECTED NOT DETECTED Final    Comment: Performed at [facility], [address], [place], [place] [address]  Culture, blood (Routine X 2) w Reflex to ID Panel     Status: None (Preliminary result)   Collection Time: 10/19/16  8:57 PM  Result Value Ref Range Status   Specimen Description BLOOD BLOOD RIGHT HAND  Final   Special Requests   Final    IN PEDIATRIC BOTTLE Blood Culture results may not be optimal due to an inadequate volume of blood received in culture bottles   Culture  Setup Time   Final    PED GRAM POSITIVE COCCI IN CLUSTERS Organism ID to follow CRITICAL RESULT CALLED TO, READ BACK BY AND VERIFIED WITH: Peggyann Juba PHARMD, AT 4742 10/21/16 BY Renato Shin Performed at [facility], [address], [place], [place] [address]    Culture GRAM POSITIVE COCCI  Final   Report Status PENDING  Incomplete  Surgical pcr screen     Status: None   Collection Time: 10/19/16 10:01 PM  Result Value Ref Range Status   MRSA, PCR NEGATIVE NEGATIVE Final   Staphylococcus aureus NEGATIVE NEGATIVE Final    Comment:        The Xpert SA Assay (FDA approved for NASAL specimens in patients over 12 years of age), is one component of a comprehensive surveillance program.  Test performance has been validated by [facility] for patients greater than or equal to 57 year old. It is not intended to diagnose infection nor to guide or monitor treatment.   Urine culture     Status: Abnormal  (Preliminary result)   Collection Time: 10/19/16 11:09 PM  Result Value Ref Range Status   Specimen Description URINE, RANDOM CYSTOSCOPY  Final   Special Requests NONE  Final   Culture (A)  Final    >=100,000 COLONIES/mL STAPHYLOCOCCUS EPIDERMIDIS SUSCEPTIBILITIES  TO FOLLOW Performed at [facility], [address], [place], [place] [address]    Report Status PENDING  Incomplete         Radiology Studies: Dg Abd 1 View  Result Date: 10/20/2016 CLINICAL DATA:  Abdominal pain and nausea. Right double-J stent placement EXAM: ABDOMEN - 1 VIEW COMPARISON:  CT abdomen and pelvis Oct 19, 2016 FINDINGS: Double-J stent extends from the level of the right renal pelvis to the bladder. The previously noted calculus at the right renal pelvis level is no longer evident. There is moderate stool in the colon. There is no bowel dilatation or air-fluid level to suggest bowel obstruction. No free air. Sclerotic focus noted in right iliac crest. IMPRESSION: Double-J stent placed on right without evidence of calculus on the right currently by radiography. Bowel gas pattern unremarkable. Sclerotic focus again noted right iliac crest. Electronically Signed   By: Bretta Bang III M.D.   On: 10/20/2016 13:50   Ct Abdomen Pelvis W Contrast  Result Date: 10/19/2016 CLINICAL DATA:  Abdominal pain and vomiting EXAM: CT ABDOMEN AND PELVIS WITH CONTRAST TECHNIQUE: Multidetector CT imaging of the abdomen and pelvis was performed using the standard protocol following bolus administration of intravenous contrast. CONTRAST:  75mL ISOVUE-300 IOPAMIDOL (ISOVUE-300) INJECTION 61% COMPARISON:  None. FINDINGS: Lower chest: There is patchy bibasilar atelectasis. No consolidation in the lung bases. There are foci of atherosclerotic calcification. There is a small hiatal hernia. Hepatobiliary: No focal liver lesions are appreciable. Gallbladder wall is not appreciably thickened. There is no biliary duct dilatation. Pancreas:  No pancreatic mass or inflammatory focus. Spleen: No splenic lesions are evident. There is a tiny splenule medial to the spleen anteriorly. 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 Left adrenal appears normal. There is no renal mass on either side. Right kidney is edematous with severe hydronephrosis. There is a calculus in the proximal right ureter immediately distal to the ureteropelvic junction measuring 1.0 x 0.8 cm. There is no intrarenal calculus on the right. On the left, there is a 5 x 3 mm calculus in the lower pole left kidney, nonobstructing. There is no ureteral calculus on the left. The urinary bladder has a mildly thickened wall with an irregular contour. There are several small diverticula arising from the urinary bladder. No mass is evident in the urinary bladder. Stomach/Bowel: Several loops of jejunum show mildly thickened walls. There is no appreciable bowel obstruction. No free air or portal venous air. There are occasional colonic diverticular without diverticulitis. Vascular/Lymphatic: There is atherosclerotic calcification in the aorta and common iliac arteries without evident aneurysm. Major mesenteric vessels appear patent. No adenopathy is appreciable in the abdomen or pelvis. Reproductive: Prostate and seminal vesicles appear unremarkable. No pelvic mass. Other: There is moderate fat in the right inguinal ring. There is evidence of previous inguinal hernia repairs bilaterally. There is a minimal ventral hernia containing only fat. Appendix appears unremarkable. There is no ascites or abscess in the abdomen or pelvis. Musculoskeletal: There are small lytic appearing lesions throughout the pelvis and proximal femurs. There is a sclerotic focus in the mid right iliac crest. Several small lucent lesions are noted in the lumbar spine. There is degenerative change in the lumbar spine. There is severe spinal stenosis at L4-5 due to diffuse disc protrusion and  bony hypertrophy. No intramuscular or abdominal wall lesion evident. IMPRESSION: 1 x 0.8 cm calculus just distal to the right ureteropelvic junction with severe hydronephrosis on the  right. There is right renal edema. Nonobstructing 5 x 3 mm calculus lower pole left kidney. Urinary bladder wall is irregular and thickened without well-defined mass. Several small diverticulum noted. Suspect cystitis. Given the irregularity of the urinary bladder wall, correlation with cystoscopy may well be advisable. Multiple small lytic appearing lesions are noted throughout the bony structures, concerning for underlying neoplasia/metastatic disease. Etiology uncertain. Sclerotic focus in the mid left iliac bone also noted. No similar sclerotic lesions evident. Given the appearance of the bony structures, it may be prudent to obtain serum electrophoresis to assess for possible multiple myeloma. Several loops of prominent jejunum, likely due to early ileus or enteritis. No bowel obstruction evident. No evident focal liver lesions. Small hiatal hernia. Small ventral hernia containing only fat. Fat in the right inguinal ring. Patient has had inguinal hernia repairs bilaterally. Small benign right adrenal myelolipoma. Spinal stenosis, moderately severe at L4-5, multifactorial. Aortoiliac atherosclerosis.  Coronary artery calcification noted. Electronically Signed   By: Bretta Bang III M.D.   On: 10/19/2016 17:02   Dg Chest Port 1 View  Result Date: 10/19/2016 CLINICAL DATA:  Vomiting and sepsis EXAM: PORTABLE CHEST 1 VIEW COMPARISON:  Chest radiograph 12/19/2005 FINDINGS: Shallow lung inflation without focal consolidation or pulmonary edema. No pneumothorax or pleural effusion. Calcific aortic atherosclerosis but otherwise normal cardiomediastinal silhouette. IMPRESSION: Shallow lung inflation without evidence of pneumonia. Electronically Signed   By: Deatra Robinson M.D.   On: 10/19/2016 21:27   Dg C-arm 1-60 Min-no  Report  Result Date: 10/19/2016 Fluoroscopy was utilized by the requesting physician.  No radiographic interpretation.        Scheduled Meds: . apixaban  5 mg Oral BID  . atorvastatin  10 mg Oral QHS  . baclofen  10 mg Oral Daily  . chlorhexidine  15 mL Mouth Rinse BID  . gabapentin  200 mg Oral [id number]  . insulin aspart  0-15 Units Subcutaneous TID WC  . insulin aspart  0-5 Units Subcutaneous QHS  . mouth rinse  15 mL Mouth Rinse q12n4p  . metoprolol succinate  12.5 mg Oral Daily  . omega-3 acid ethyl esters  1 g Oral Daily  . polyethylene glycol  17 g Oral Daily   Continuous Infusions: . sodium chloride 1,000 mL (10/20/16 1745)  . vancomycin Stopped (10/21/16 0901)     LOS: 2 days    Time spent: 35 minutes.     Alba Cory, MD [facility] Pager [phone]  If 7PM-7AM, please contact night-coverage [email or web address] Password [email or web address] 10/21/2016, 1:59 PM

## 2016-10-22 DIAGNOSIS — A4189 Other specified sepsis: Secondary | ICD-10-CM | POA: Diagnosis not present

## 2016-10-22 DIAGNOSIS — D649 Anemia, unspecified: Secondary | ICD-10-CM | POA: Diagnosis not present

## 2016-10-22 DIAGNOSIS — N189 Chronic kidney disease, unspecified: Secondary | ICD-10-CM | POA: Diagnosis not present

## 2016-10-22 DIAGNOSIS — Z7901 Long term (current) use of anticoagulants: Secondary | ICD-10-CM | POA: Diagnosis not present

## 2016-10-22 DIAGNOSIS — M899 Disorder of bone, unspecified: Secondary | ICD-10-CM

## 2016-10-22 DIAGNOSIS — K567 Ileus, unspecified: Secondary | ICD-10-CM | POA: Diagnosis not present

## 2016-10-22 LAB — URINE CULTURE: Culture: >=100000 — AB

## 2016-10-22 LAB — CBC
HEMATOCRIT: 37.6 % — AB (ref 39.0–52.0)
HEMOGLOBIN: 12 g/dL — AB (ref 13.0–17.0)
MCH: 28.2 pg (ref 26.0–34.0)
MCHC: 31.9 g/dL (ref 30.0–36.0)
MCV: 88.3 fL (ref 78.0–100.0)
PLATELETS: 207 10*3/uL (ref 150–400)
RBC: 4.26 MIL/uL (ref 4.22–5.81)
RDW: 13.3 % (ref 11.5–15.5)
WBC: 9 10*3/uL (ref 4.0–10.5)

## 2016-10-22 LAB — BASIC METABOLIC PANEL
ANION GAP: 10 (ref 5–15)
BUN: 14 mg/dL (ref 6–20)
CO2: 25 mmol/L (ref 22–32)
Calcium: 8.4 mg/dL — ABNORMAL LOW (ref 8.9–10.3)
Chloride: 102 mmol/L (ref 101–111)
Creatinine, Ser: 1.01 mg/dL (ref 0.61–1.24)
GLUCOSE: 236 mg/dL — AB (ref 65–99)
POTASSIUM: 3.7 mmol/L (ref 3.5–5.1)
Sodium: 137 mmol/L (ref 135–145)

## 2016-10-22 LAB — GLUCOSE, CAPILLARY
GLUCOSE-CAPILLARY: 288 mg/dL — AB (ref 65–99)
GLUCOSE-CAPILLARY: 208 mg/dL — AB (ref 65–99)
GLUCOSE-CAPILLARY: 307 mg/dL — AB (ref 65–99)
Glucose-Capillary: 270 mg/dL — ABNORMAL HIGH (ref 65–99)

## 2016-10-22 LAB — VANCOMYCIN, TROUGH: VANCOMYCIN TR: 15 ug/mL (ref 15–20)

## 2016-10-22 MED ORDER — BACLOFEN 10 MG PO TABS
10.0000 mg | ORAL_TABLET | Freq: Every day | ORAL | Status: DC
  Administered 2016-10-22 – 2016-10-26 (×5): 10 mg via ORAL
  Filled 2016-10-22 (×4): qty 1

## 2016-10-22 NOTE — Progress Notes (Signed)
3 Days Post-Op   Subjective/Chief Complaint:  1- Right Ureteral Stone - 1cm rt proximal ureteral stone with severe hydro and delayed nephrogram by CT 10/2016 on eval flank pain and nausea / emesis. Stone is solitary, 1cm, SSD 13cm, 1100HU. Rt ureteral stent (6x26 contour) placed urgently 5/8.   2 - Gram Positive Urosepsis - fevers, malaise, leukocytosis to 30k and significant right perinephric stranding worrisome for obsructing pyelo /sepsis. BCX and UCX 5/8 Staph sens Vanc, Clinda, Bactrim. Now improving on therapy.   PMH sig for DM2, CVA with hemiparesis / Eliquus use.  Today " Wesley Ingram " is improved. Much less maliase   Objective: Vital signs in last 24 hours: Temp:  [97.7 F (36.5 C)-98.7 F (37.1 C)] 97.8 F (36.6 C) (05/11 1411) Pulse Rate:  [77-85] 85 (05/11 1411) Resp:  [20] 20 (05/11 1411) BP: (135-145)/(71-83) 141/71 (05/11 1411) SpO2:  [95 %-99 %] 99 % (05/11 1411) Last BM Date: 10/21/16  Intake/Output from previous day: 05/10 0701 - 05/11 0700 In: 2494.2 [P.O.:720; I.V.:1574.2; IV Piggyback:200] Out: 1951 [ONGEX:5284; Stool:1] Intake/Output this shift: Total I/O In: 1070 [P.O.:120; I.V.:550; IV Piggyback:400] Out: 1324 [Urine:1775]  General appearance: alert, cooperative and much improved mental status.  Eyes: negative Nose: Nares normal. Septum midline. Mucosa normal. No drainage or sinus tenderness. Throat: lips, mucosa, and tongue normal; teeth and gums normal Resp: non-labored on room air.  Cardio: Nl rate GI: soft, non-tender; bowel sounds normal; no masses,  no organomegaly Male genitalia: normal, foley in situe wtih yellow urine that is non-foul.  Extremities: extremities normal, atraumatic, no cyanosis or edema Pulses: 2+ and symmetric Lymph nodes: Cervical, supraclavicular, and axillary nodes normal. Neurologic: Grossly normal  Lab Results:   Recent Labs  10/21/16 0539 10/22/16 0603  WBC 10.1 9.0  HGB 11.7* 12.0*  HCT 35.1* 37.6*  PLT 182 207    BMET  Recent Labs  10/21/16 0539 10/22/16 0603  NA 138 137  K 3.6 3.7  CL 106 102  CO2 25 25  GLUCOSE 164* 236*  BUN 22* 14  CREATININE 1.11 1.01  CALCIUM 7.9* 8.4*   PT/INR  Recent Labs  10/19/16 2020  LABPROT 17.2*  INR 1.39   ABG No results for input(s): PHART, HCO3 in the last 72 hours.  Invalid input(s): PCO2, PO2  Studies/Results: No results found.  Anti-infectives: Anti-infectives    Start     Dose/Rate Route Frequency Ordered Stop   10/21/16 0800  vancomycin (VANCOCIN) IVPB 1000 mg/200 mL premix     1,000 mg 200 mL/hr over 60 Minutes Intravenous Every 12 hours 10/21/16 0750     10/19/16 2000  cefTRIAXone (ROCEPHIN) 2 g in dextrose 5 % 50 mL IVPB  Status:  Discontinued     2 g 100 mL/hr over 30 Minutes Intravenous  Once 10/19/16 1949 10/19/16 1955   10/19/16 2000  cefTRIAXone (ROCEPHIN) 1 g in dextrose 5 % 50 mL IVPB  Status:  Discontinued     1 g 100 mL/hr over 30 Minutes Intravenous Every 24 hours 10/19/16 1954 10/21/16 1133      Assessment/Plan:  1- Right Ureteral Stone - now s/p decompression with stent. Will need ureteroscopy in elective setting after clears infectious parameters, certainly not this admission. We will arrange for this.   2 - Likely Urosepsis - Improving subjectively and objectively on current therapy.  We will arrange for follow up for stone surgery in few weeks. We will follow patient PRN at this point. Please call me directly with questions  anytime.    [facility], Lesslie Mossa 10/22/2016

## 2016-10-22 NOTE — Progress Notes (Signed)
[facility] pt for [facility] this hospital admission.  [facility] will provide [facility] services for IV ABX upon DC as ordered.  [facility] is prepared for a weekend DC if ordered.  If patient discharges after hours, please call [phone].   Larry Sierras 10/22/2016, 3:13 PM

## 2016-10-22 NOTE — Care Management Note (Signed)
Case Management Note  Patient Details  Name: Dammon Makarewicz MRN: [id number] Date of Birth: [birth year]  Subjective/Objective:   Noted patient may need long term iv abx-[facility] infusion rep Pam aware & following-will need picc if d/c plan home w/long term iv abx.                 Action/Plan:d/c plan home w/HHC.   Expected Discharge Date:                  Expected Discharge Plan:  [place]  In-House Referral:     Discharge planning Services  CM Consult  Post Acute Care Choice:    Choice offered to:  Patient  DME Arranged:    DME Agency:     HH Arranged:    [place] Agency:     Status of Service:  In process, will continue to follow  If discussed at Long Length of Stay Meetings, dates discussed:    Additional Comments:  Dessa Phi, RN 10/22/2016, 1:18 PM

## 2016-10-22 NOTE — Progress Notes (Signed)
PROGRESS NOTE    Wesley Ingram  MWU:[id number] DOB: August 03, 1945 DOA: 10/19/2016 PCP: Cleta Alberts, MD    Brief Narrative: Wesley Ingram is a 71 y.o. male with medical history significant of remote CVA with resultant left hemiparesis; HTN; hypertriglyceridemia; and DM presenting with persistent n/v.  Started day prior to admission.  Pain in periumbilical region, continued to vomit through the day today.  BP 158/94 today, glucose 254.  Total emesis x 10-15 times, 3-4 times today.  Also with retching, heaves. Specifically denies urinary symptoms.  Mild RLQ pain.   ED Course: Tachycardia and elevated WBC count; CT with ileus without SBO; also with large right kidney stone with obstruction and hydronephrosis; also with multiple lytic lesions concerning for multiple myeloma.    Assessment & Plan:   Principal Problem:   Ileus (HCC) Active Problems:   DM (diabetes mellitus) (HCC)   HTN (hypertension)   Hyperlipidemia   History of CVA (cerebrovascular accident)   Nephrolithiasis   Lytic bone lesions on xray   Acute renal failure (ARF) (HCC)   Chronic anticoagulation   1-Nephrolithiasis, Acute renal failure, UTI;  CT scan; 1 x 0.8 cm obstructing stone with resultant severe right-sided hydronephrosis Underwent ureteral stent placement 5-08 by Dr Berneice Heinrich.  Renal function improving.  Continue with IV fluids, IV antibiotics.  Urine culture; growing staphylococcus epidermidis.  Still has foley catheter.   Bacteremia, staphylococcus ; Methicillin resistant. Await final culture results. Continue with Vancomycin.  Will repeat blood cultures.  Urine growing staph epidermidis, likely bacteremia from urine source.  Discussed with ID, patient will need 2 weeks of IV antibiotics.  Will check ECHO/   2-Sepsis, secondary to UTI; Presents with leukocytosis, fever, tachycardia. Source of infection UTI.  WBC has decreased to 17 from 29.  Blood culture positive for staph. Continue with  vancomycin    3-Ileus, vs enteritis CT showed ileus vs enteritis.  KUB negative, he is tolerating clear diet.  IV fluids.  Resolved.   4-Multiples Bone lesion on CT scan;  protein electrophoresis negative for M spike.  Oncology consulted. Appreciate Dr Clelia Croft help. Patient will need outpatient follow up.   HTN;  Continue with metoprolol. Start low dose Norvasc.  Holding ARB due to AKI.   Chronic anticoagulation Unclear reason, ? Stroke.  Continue with eliquis for now.   DM;  SSI. Hold oral medications.   HLD; continue with lipitor.   h/o CVA -Has residual left hemiparesis -He is taking BB and ARB and statin   DVT prophylaxis: Eliquis Code Status: DNR Family Communication: care discussed with patient Disposition Plan: remain inpatient for IV antibiotics.    Consultants:   Dr Clelia Croft, oncology  Urology, Sebastian Ache   Procedures:  Stent placement   Antimicrobials:   Ceftriaxone 5-08   Subjective: Had 2 BM yesterday.  Denies pain.     Objective: Vitals:   10/21/16 1338 10/21/16 2029 10/22/16 0450 10/22/16 0958  BP: (!) 157/84 (!) 145/78 135/76 (!) 141/83  Pulse: 83 83 77 81  Resp: 20 20 20    Temp: 97.8 F (36.6 C) 98.7 F (37.1 C) 97.7 F (36.5 C)   TempSrc: Oral Oral Oral   SpO2: 98% 97% 95%   Weight:      Height:        Intake/Output Summary (Last 24 hours) at 10/22/16 1341 Last data filed at 10/22/16 1143  Gross per 24 hour  Intake          1294.17 ml  Output  1650 ml  Net          -355.83 ml   Filed Weights   10/19/16 1409  Weight: 74.8 kg (165 lb)    Examination:  General exam: NAD Respiratory system: CTA Cardiovascular system: S 1, S 2 RRR Gastrointestinal system: soft, nt, nd Central nervous system: Alert and oriented. Chronic left hemiparesis.  Extremities: Symmetric 5 x 5 power. Skin; no rash      Data Reviewed: I have personally reviewed following labs and imaging studies  CBC:  Recent Labs Lab  10/19/16 1420 10/20/16 0612 10/21/16 0539 10/22/16 0603  WBC 29.9* 17.8* 10.1 9.0  HGB 14.3 11.9* 11.7* 12.0*  HCT 41.4 35.0* 35.1* 37.6*  MCV 86.4 87.3 88.4 88.3  PLT 277 198 182 207   Basic Metabolic Panel:  Recent Labs Lab 10/19/16 1420 10/20/16 0612 10/21/16 0539 10/22/16 0603  NA 135 135 138 137  K 4.1 3.7 3.6 3.7  CL 98* 104 106 102  CO2 24 21* 25 25  GLUCOSE 220* 158* 164* 236*  BUN 37* 34* 22* 14  CREATININE 1.89* 1.69* 1.11 1.01  CALCIUM 9.6 7.9* 7.9* 8.4*   GFR: Estimated Creatinine Clearance: 71.3 mL/min (by C-G formula based on SCr of 1.01 mg/dL). Liver Function Tests:  Recent Labs Lab 10/19/16 1420  AST 25  ALT 17  ALKPHOS 52  BILITOT 0.9  PROT 8.1  ALBUMIN 4.3    Recent Labs Lab 10/19/16 1420  LIPASE 30   No results for input(s): AMMONIA in the last 168 hours. Coagulation Profile:  Recent Labs Lab 10/19/16 2020  INR 1.39   Cardiac Enzymes: No results for input(s): CKTOTAL, CKMB, CKMBINDEX, TROPONINI in the last 168 hours. BNP (last 3 results) No results for input(s): PROBNP in the last 8760 hours. HbA1C:  Recent Labs  10/19/16 2211  HGBA1C 6.6*   CBG:  Recent Labs Lab 10/21/16 1128 10/21/16 1647 10/21/16 2141 10/22/16 0721 10/22/16 1120  GLUCAP 195* 266* 251* 270* 288*   Lipid Profile: No results for input(s): CHOL, HDL, LDLCALC, TRIG, CHOLHDL, LDLDIRECT in the last 72 hours. Thyroid Function Tests: No results for input(s): TSH, T4TOTAL, FREET4, T3FREE, THYROIDAB in the last 72 hours. Anemia Panel: No results for input(s): VITAMINB12, FOLATE, FERRITIN, TIBC, IRON, RETICCTPCT in the last 72 hours. Sepsis Labs:  Recent Labs Lab 10/19/16 1539 10/19/16 1752 10/19/16 2020 10/19/16 2206  PROCALCITON  --   --  0.17  --   LATICACIDVEN 2.9* 2.4* 1.8 1.5    Recent Results (from the past 240 hour(s))  Culture, blood (x 2)     Status: Abnormal (Preliminary result)   Collection Time: 10/19/16  8:15 PM  Result Value  Ref Range Status   Specimen Description BLOOD RIGHT ANTECUBITAL  Final   Special Requests IN PEDIATRIC BOTTLE Blood Culture adequate volume  Final   Culture  Setup Time   Final    GRAM POSITIVE COCCI IN PEDIATRIC BOTTLE CRITICAL RESULT CALLED TO, READ BACK BY AND VERIFIED WITH: Peggyann Juba PHARMD, AT 9147 10/21/16 BY D. VANHOOK    Culture (A)  Final    STAPHYLOCOCCUS EPIDERMIDIS SUSCEPTIBILITIES TO FOLLOW Performed at [facility], [address], [place], [place] [address]    Report Status PENDING  Incomplete  Blood Culture ID Panel (Reflexed)     Status: Abnormal   Collection Time: 10/19/16  8:15 PM  Result Value Ref Range Status   Enterococcus species NOT DETECTED NOT DETECTED Final   Listeria monocytogenes NOT DETECTED NOT  DETECTED Final   Staphylococcus species DETECTED (A) NOT DETECTED Final    Comment: Methicillin (oxacillin) resistant coagulase negative staphylococcus. Possible blood culture contaminant (unless isolated from more than one blood culture draw or clinical case suggests pathogenicity). No antibiotic treatment is indicated for blood  culture contaminants. CRITICAL RESULT CALLED TO, READ BACK BY AND VERIFIED WITH: JIllene Bolus PHARMD, AT 9604 10/21/16 BY D. VANHOOK    Staphylococcus aureus NOT DETECTED NOT DETECTED Final   Methicillin resistance DETECTED (A) NOT DETECTED Final    Comment: CRITICAL RESULT CALLED TO, READ BACK BY AND VERIFIED WITH: J. GRIMSLEY PHARMD, AT 5409 10/21/16 BY D. VANHOOK    Streptococcus species NOT DETECTED NOT DETECTED Final   Streptococcus agalactiae NOT DETECTED NOT DETECTED Final   Streptococcus pneumoniae NOT DETECTED NOT DETECTED Final   Streptococcus pyogenes NOT DETECTED NOT DETECTED Final   Acinetobacter baumannii NOT DETECTED NOT DETECTED Final   Enterobacteriaceae species NOT DETECTED NOT DETECTED Final   Enterobacter cloacae complex NOT DETECTED NOT DETECTED Final   Escherichia coli NOT DETECTED NOT DETECTED Final    Klebsiella oxytoca NOT DETECTED NOT DETECTED Final   Klebsiella pneumoniae NOT DETECTED NOT DETECTED Final   Proteus species NOT DETECTED NOT DETECTED Final   Serratia marcescens NOT DETECTED NOT DETECTED Final   Haemophilus influenzae NOT DETECTED NOT DETECTED Final   Neisseria meningitidis NOT DETECTED NOT DETECTED Final   Pseudomonas aeruginosa NOT DETECTED NOT DETECTED Final   Candida albicans NOT DETECTED NOT DETECTED Final   Candida glabrata NOT DETECTED NOT DETECTED Final   Candida krusei NOT DETECTED NOT DETECTED Final   Candida parapsilosis NOT DETECTED NOT DETECTED Final   Candida tropicalis NOT DETECTED NOT DETECTED Final    Comment: Performed at [facility], [address], [place], [place] [address]  Culture, blood (Routine X 2) w Reflex to ID Panel     Status: Abnormal (Preliminary result)   Collection Time: 10/19/16  8:57 PM  Result Value Ref Range Status   Specimen Description BLOOD BLOOD RIGHT HAND  Final   Special Requests   Final    IN PEDIATRIC BOTTLE Blood Culture results may not be optimal due to an inadequate volume of blood received in culture bottles   Culture  Setup Time   Final    PED GRAM POSITIVE COCCI IN CLUSTERS CRITICAL RESULT CALLED TO, READ BACK BY AND VERIFIED WITH: Peggyann Juba PHARMD, AT 4782 10/21/16 BY D. VANHOOK    Culture (A)  Final    STAPHYLOCOCCUS EPIDERMIDIS SUSCEPTIBILITIES TO FOLLOW Performed at [facility], [address], [place], [place] [address]    Report Status PENDING  Incomplete  Surgical pcr screen     Status: None   Collection Time: 10/19/16 10:01 PM  Result Value Ref Range Status   MRSA, PCR NEGATIVE NEGATIVE Final   Staphylococcus aureus NEGATIVE NEGATIVE Final    Comment:        The Xpert SA Assay (FDA approved for NASAL specimens in patients over 30 years of age), is one component of a comprehensive surveillance program.  Test performance has been validated by [facility] for patients greater than or  equal to 46 year old. It is not intended to diagnose infection nor to guide or monitor treatment.   Urine culture     Status: Abnormal   Collection Time: 10/19/16 11:09 PM  Result Value Ref Range Status   Specimen Description URINE, RANDOM CYSTOSCOPY  Final   Special Requests NONE  Final   Culture >=100,000 COLONIES/mL STAPHYLOCOCCUS EPIDERMIDIS (A)  Final   Report Status 10/22/2016 FINAL  Final   Organism ID, Bacteria STAPHYLOCOCCUS EPIDERMIDIS (A)  Final      Susceptibility   Staphylococcus epidermidis - MIC*    CIPROFLOXACIN >=8 RESISTANT Resistant     GENTAMICIN <=0.5 SENSITIVE Sensitive     NITROFURANTOIN <=16 SENSITIVE Sensitive     OXACILLIN >=4 RESISTANT Resistant     TETRACYCLINE 2 SENSITIVE Sensitive     VANCOMYCIN 1 SENSITIVE Sensitive     TRIMETH/SULFA 20 SENSITIVE Sensitive     CLINDAMYCIN <=0.25 SENSITIVE Sensitive     RIFAMPIN <=0.5 SENSITIVE Sensitive     Inducible Clindamycin NEGATIVE Sensitive     * >=100,000 COLONIES/mL STAPHYLOCOCCUS EPIDERMIDIS         Radiology Studies: Dg Abd 1 View  Result Date: 10/20/2016 CLINICAL DATA:  Abdominal pain and nausea. Right double-J stent placement EXAM: ABDOMEN - 1 VIEW COMPARISON:  CT abdomen and pelvis Oct 19, 2016 FINDINGS: Double-J stent extends from the level of the right renal pelvis to the bladder. The previously noted calculus at the right renal pelvis level is no longer evident. There is moderate stool in the colon. There is no bowel dilatation or air-fluid level to suggest bowel obstruction. No free air. Sclerotic focus noted in right iliac crest. IMPRESSION: Double-J stent placed on right without evidence of calculus on the right currently by radiography. Bowel gas pattern unremarkable. Sclerotic focus again noted right iliac crest. Electronically Signed   By: Bretta Bang III M.D.   On: 10/20/2016 13:50        Scheduled Meds: . amLODipine  2.5 mg Oral Daily  . apixaban  5 mg Oral BID  . atorvastatin  10  mg Oral QHS  . [START ON 10/23/2016] baclofen  10 mg Oral Daily  . chlorhexidine  15 mL Mouth Rinse BID  . gabapentin  200 mg Oral BH-[id number]  . insulin aspart  0-15 Units Subcutaneous TID WC  . insulin aspart  0-5 Units Subcutaneous QHS  . mouth rinse  15 mL Mouth Rinse q12n4p  . metoprolol succinate  12.5 mg Oral Daily  . omega-3 acid ethyl esters  1 g Oral Daily  . polyethylene glycol  17 g Oral Daily   Continuous Infusions: . sodium chloride 50 mL/hr at 10/21/16 1603  . vancomycin Stopped (10/22/16 0846)     LOS: 3 days    Time spent: 35 minutes.     Alba Cory, MD [facility] Pager [phone]  If 7PM-7AM, please contact night-coverage [email or web address] Password [email or web address] 10/22/2016, 1:41 PM

## 2016-10-22 NOTE — Progress Notes (Signed)
Pharmacy Antibiotic Note  Wesley Ingram is a 71 y.o. male admitted on 10/19/2016 with MR staph epidermidis bacteremia.  Pharmacy has been consulted for vancomycin dosing. Day # 2 vancomycin. WBC WNL, creat 1.01. Vancomycin trough is 15 mcg/ml after 3 doses of vancomycin 1 gm q12h - within desired goal. Repeat BC drawn this morning pending.  Plan for 2 weeks IV vanc from first day of negative blood cultures.   Plan: continue Vancomycin 1000 mg IV every 12 hours.  Goal trough 15-20 mcg/mL. f/u repeat BC f/u for OPAT consult  Height: 5\' 10"  (177.8 cm) Weight: 165 lb (74.8 kg) IBW/kg (Calculated) : 73  Temp (24hrs), Avg:98.1 F (36.7 C), Min:97.7 F (36.5 C), Max:98.7 F (37.1 C)   Recent Labs Lab 10/19/16 1420 10/19/16 1539 10/19/16 1752 10/19/16 2020 10/19/16 2206 10/20/16 0612 10/21/16 0539 10/22/16 0603 10/22/16 1915  WBC 29.9*  --   --   --   --  17.8* 10.1 9.0  --   CREATININE 1.89*  --   --   --   --  1.69* 1.11 1.01  --   LATICACIDVEN  --  2.9* 2.4* 1.8 1.5  --   --   --   --   VANCOTROUGH  --   --   --   --   --   --   --   --  15    Estimated Creatinine Clearance: 71.3 mL/min (by C-G formula based on SCr of 1.01 mg/dL).    No Known Allergies  Antimicrobials this admission: CTX 5/8>>5/10 vanc 5/10>>  Dose adjustments this admission: 5/11 VT = 15 on 1 gm q12  Microbiology results: 5/8 UCx>> > 100K staph epidermidis, S clinda, gent, rif. TCN, Setpra, vanc 5/8 BCx2>>staph epidermidis, MR 5/8 BCID MR staphylococcus species,  not SA 5//8 MRSA PCR neg 5/11 BCxx2 >>  Thank you for allowing pharmacy to be a part of this patient's care.  Eudelia Bunch, Pharm.D. [phone] 10/22/2016 8:22 PM

## 2016-10-22 NOTE — Care Management Note (Signed)
Case Management Note  Patient Details  Name: Wesley Ingram MRN: [id number] Date of Birth: June 28, 1945  Subjective/Objective:    PT recc HHPT. Patient offered choice for HHCagency-patient chose [facility] rep Jermaine aware & following. Patient has hemi rw.Await final [place] orders.                Action/Plan:d/c home w/HHC.   Expected Discharge Date:                  Expected Discharge Plan:  [place]  In-House Referral:     Discharge planning Services  CM Consult  Post Acute Care Choice:    Choice offered to:  Patient  DME Arranged:    DME Agency:     HH Arranged:    [place] Agency:     Status of Service:  In process, will continue to follow  If discussed at Long Length of Stay Meetings, dates discussed:    Additional Comments:  Dessa Phi, RN 10/22/2016, 12:41 PM

## 2016-10-22 NOTE — Progress Notes (Signed)
Inpatient Diabetes Program Recommendations  AACE/ADA: New Consensus Statement on Inpatient Glycemic Control (2015)  Target Ranges:  Prepandial:   less than 140 mg/dL      Peak postprandial:   less than 180 mg/dL (1-2 hours)      Critically ill patients:  140 - 180 mg/dL   Lab Results  Component Value Date   GLUCAP 270 (H) 10/22/2016   HGBA1C 6.6 (H) 10/19/2016    Review of Glycemic Control  Diabetes history: DM2 Outpatient Diabetes medications: metformin 1000 mg bid, glipizide 5 mg bid Current orders for Inpatient glycemic control: Novolog 0-15 units tidwc and hs.  Blood sugars in 200s. May benefit from small amount of basal insulin while inpatient. HgbA1C is excellent.  Inpatient Diabetes Program Recommendations:    Add Lantus 8 units QHS.  Will continue to follow.  Thank you. Lorenda Peck, RD, LDN, CDE Inpatient Diabetes Coordinator [phone]

## 2016-10-22 NOTE — Progress Notes (Signed)
CSW met with patient at bedside, explain role and reason for csw intervention. Patient reports at this time his plan is to home with family. Patient feels he walking a lot better than 5/10 when he needed two plus assist to get him up. The patient states, " As long as I keep walking and moving, I plan to go home."  CSW discussed with Physical Therapist.  No other needs CSW need identified. CSW signing off.   Nicole Sinclair, LCSWA, MSW Clinical Social Worker 5E and Psychiatric Service Line [phone] 10/22/2016  10:58 AM 

## 2016-10-22 NOTE — Progress Notes (Signed)
Pharmacy Antibiotic Note  Wesley Ingram is a 71 y.o. male admitted on 10/19/2016 with MRSA bacteremia.  Pharmacy has been consulted for Vancomycin dosing.  Plan: Obtain vanc trough tonight Vancomycin 1gm IV every 12 hours.  Goal trough 15-20 mcg/mL.  Height: 5\' 10"  (177.8 cm) Weight: 165 lb (74.8 kg) IBW/kg (Calculated) : 73  Temp (24hrs), Avg:98.1 F (36.7 C), Min:97.7 F (36.5 C), Max:98.7 F (37.1 C)   Recent Labs Lab 10/19/16 1420 10/19/16 1539 10/19/16 1752 10/19/16 2020 10/19/16 2206 10/20/16 0612 10/21/16 0539 10/22/16 0603  WBC 29.9*  --   --   --   --  17.8* 10.1 9.0  CREATININE 1.89*  --   --   --   --  1.69* 1.11 1.01  LATICACIDVEN  --  2.9* 2.4* 1.8 1.5  --   --   --     Estimated Creatinine Clearance: 71.3 mL/min (by C-G formula based on SCr of 1.01 mg/dL).    No Known Allergies  Antimicrobials this admission: Vancomycin 5/10 > Ceftriaxone 5/8> 5/9  Dose adjustments this admission: 5/11 VT 1900=____  Microbiology results: 5/8 UCx>> > 100K unidentified organsim 5/8 BCx2>>gp cocci clusters x 2 5/8 BCID MR staphylococcus species,  not SA 5//8 MRSA PCR neg  Thank you for allowing pharmacy to be a part of this patient's care.  Angela Adam 10/22/2016 11:39 AM

## 2016-10-22 NOTE — Evaluation (Signed)
Physical Therapy Evaluation Patient Details Name: Wesley Ingram MRN: [id number] DOB: 03-Jun-1946 Today's Date: 10/22/2016   History of Present Illness  71 y.o. male with medical history significant of remote CVA with residual left hemiparesis; HTN; hypertriglyceridemia; and DM and admitted for ileus and Nephrolithiasis, Acute renal failure, UTI  Clinical Impression  Pt admitted with above diagnosis. Pt currently with functional limitations due to the deficits listed below (see PT Problem List).  Pt will benefit from skilled PT to increase their independence and safety with mobility to allow discharge to the venue listed below.  Pt reports feeling weaker and more fatigued then his baseline after mobility.  Pt able to ambulate 40 feet with hemiwalker and min assist for steadying.  Pt feels he will be able to return home upon d/c if he is able to continue mobilizing in acute setting.     Follow Up Recommendations Home health PT;Supervision for mobility/OOB    Equipment Recommendations  None recommended by PT    Recommendations for Other Services       Precautions / Restrictions Precautions Precautions: Fall      Mobility  Bed Mobility Overal bed mobility: Needs Assistance Bed Mobility: Supine to Sit;Sit to Supine     Supine to sit: Min guard;HOB elevated Sit to supine: Min guard;HOB elevated   General bed mobility comments: utilized rail to self assist  Transfers Overall transfer level: Needs assistance Equipment used: Hemi-walker Transfers: Sit to/from Stand Sit to Stand: Min assist         General transfer comment: able to perform safe technique, assist to steady with rise  Ambulation/Gait Ambulation/Gait assistance: Min assist Ambulation Distance (Feet): 40 Feet Assistive device: Hemi-walker Gait Pattern/deviations: Step-to pattern;Decreased dorsiflexion - left;Decreased weight shift to left     General Gait Details: pt typically ambulates with hemiwalker  however reports his gait is not at baseline and he fatigued quicker then usual, min assist for 2 instances of LOB with initial gait, improved with distance  Stairs            Wheelchair Mobility    Modified Rankin (Stroke Patients Only)       Balance Overall balance assessment: Needs assistance         Standing balance support: Single extremity supported Standing balance-Leahy Scale: Poor Standing balance comment: required slight external assist during ambulation today                             Pertinent Vitals/Pain Pain Assessment: No/denies pain    Home Living Family/patient expects to be discharged to:: Private residence Living Arrangements: Spouse/significant other   Type of Home: House Home Access: Stairs to enter Entrance Stairs-Rails: Right Entrance Stairs-Number of Steps: 3 Home Layout: One level Home Equipment: Cane - single point;Tub bench;Wheelchair - manual Psychologist, educational)      Prior Function Level of Independence: Independent with assistive device(s)         Comments: mod I using hemiwalker and w/c for mobility, able to ambulate short distances     Hand Dominance        Extremity/Trunk Assessment   Upper Extremity Assessment Upper Extremity Assessment: LUE deficits/detail LUE Deficits / Details: observed tone, flaccid L UE    Lower Extremity Assessment Lower Extremity Assessment: Generalized weakness;LLE deficits/detail LLE Deficits / Details: hx of CVA with residual hemiparesis, does not use AFO       Communication   Communication: No difficulties  Cognition Arousal/Alertness: Awake/alert Behavior  During Therapy: WFL for tasks assessed/performed Overall Cognitive Status: Within Functional Limits for tasks assessed                                        General Comments      Exercises     Assessment/Plan    PT Assessment Patient needs continued PT services  PT Problem List Decreased  strength;Decreased mobility;Decreased activity tolerance;Decreased balance       PT Treatment Interventions Gait training;DME instruction;Therapeutic activities;Therapeutic exercise;Patient/family education;Stair training;Balance training;Functional mobility training    PT Goals (Current goals can be found in the Care Plan section)  Acute Rehab PT Goals PT Goal Formulation: With patient Time For Goal Achievement: 10/29/16 Potential to Achieve Goals: Good    Frequency Min 3X/week   Barriers to discharge        Co-evaluation               AM-PAC PT "6 Clicks" Daily Activity  Outcome Measure Difficulty turning over in bed (including adjusting bedclothes, sheets and blankets)?: A Little Difficulty moving from lying on back to sitting on the side of the bed? : A Little Difficulty sitting down on and standing up from a chair with arms (e.g., wheelchair, bedside commode, etc,.)?: A Little Help needed moving to and from a bed to chair (including a wheelchair)?: A Little Help needed walking in hospital room?: A Little Help needed climbing 3-5 steps with a railing? : A Lot 6 Click Score: 17    End of Session Equipment Utilized During Treatment: Gait belt Activity Tolerance: Patient tolerated treatment well Patient left: in bed;with call bell/phone within reach Nurse Communication: Mobility status PT Visit Diagnosis: Difficulty in walking, not elsewhere classified (R26.2);Muscle weakness (generalized) (M62.81)    Time: 0272-5366 PT Time Calculation (min) (ACUTE ONLY): 24 min   Charges:   PT Evaluation $PT Eval Low Complexity: 1 Procedure     PT G Codes:        Zenovia Jarred, PT, DPT 10/22/2016 Pager: [phone]   Maida Sale E 10/22/2016, 11:51 AM

## 2016-10-22 NOTE — Progress Notes (Signed)
IP PROGRESS NOTE  Subjective:   Wesley Ingram reports no complaints today. He was able to ambulate with the help of walker in the hallways without any falls or syncope. He denied any pelvic pain or discomfort. He denied any hematuria or dysuria.  Objective:  Vital signs in last 24 hours: Temp:  [97.7 F (36.5 C)-98.7 F (37.1 C)] 97.7 F (36.5 C) (05/11 0450) Pulse Rate:  [77-83] 81 (05/11 0958) Resp:  [20] 20 (05/11 0450) BP: (135-157)/(76-84) 141/83 (05/11 0958) SpO2:  [95 %-98 %] 95 % (05/11 0450) Weight change:  Last BM Date: 10/21/16  Intake/Output from previous day: 05/10 0701 - 05/11 0700 In: 2494.2 [P.O.:720; I.V.:1574.2; IV Piggyback:200] Out: 1951 [JSRPR:[id number]; Stool:1] Alert, awake gentleman appeared without distress. Mouth: mucous membranes moist, pharynx normal without lesions Resp: clear to auscultation bilaterally Cardio: regular rate and rhythm, S1, S2 normal, no murmur, click, rub or gallop GI: soft, non-tender; bowel sounds normal; no masses,  no organomegaly Extremities: extremities normal, atraumatic, no cyanosis or edema    Lab Results:  Recent Labs  10/21/16 0539 10/22/16 0603  WBC 10.1 9.0  HGB 11.7* 12.0*  HCT 35.1* 37.6*  PLT 182 207    BMET  Recent Labs  10/21/16 0539 10/22/16 0603  NA 138 137  K 3.6 3.7  CL 106 102  CO2 25 25  GLUCOSE 164* 236*  BUN 22* 14  CREATININE 1.11 1.01  CALCIUM 7.9* 8.4*    Studies/Results: Dg Abd 1 View  Result Date: 10/20/2016 CLINICAL DATA:  Abdominal pain and nausea. Right double-J stent placement EXAM: ABDOMEN - 1 VIEW COMPARISON:  CT abdomen and pelvis Oct 19, 2016 FINDINGS: Double-J stent extends from the level of the right renal pelvis to the bladder. The previously noted calculus at the right renal pelvis level is no longer evident. There is moderate stool in the colon. There is no bowel dilatation or air-fluid level to suggest bowel obstruction. No free air. Sclerotic focus noted in right iliac  crest. IMPRESSION: Double-J stent placed on right without evidence of calculus on the right currently by radiography. Bowel gas pattern unremarkable. Sclerotic focus again noted right iliac crest. Electronically Signed   By: Lowella Grip III M.D.   On: 10/20/2016 13:50    Medications: I have reviewed the patient's current medications.  Assessment/Plan:  71 year old gentleman with the following issues:  1.Small lytic lesions noted in the pelvic and spine detected on CT scan on May 2018. These findings could suggest multiple myeloma versus metastatic malignancy. Other benign findings are also considered at this time.  His SPEP is currently pending and he is clinically asymptomatic from these findings. He does not have any evidence of renal insufficiency or electrolyte imbalance.  I have recommended outpatient follow-up regarding these issues. I will arrange oncology follow-up upon his discharge from the next few weeks.  2. Urosepsis and ureteral stones: He is status post double-J stent placement. He is finishing course of antibiotics and appears to be clinically improving. Do not think these findings suggesting advanced malignancy with metastatic disease to the bone.  Please call with any questions regarding this nice gentleman.   LOS: 3 days   [id number],MQKMM 10/22/2016, 10:50 AM

## 2016-10-23 ENCOUNTER — Inpatient Hospital Stay (HOSPITAL_COMMUNITY): Payer: BLUE CROSS/BLUE SHIELD

## 2016-10-23 DIAGNOSIS — K567 Ileus, unspecified: Secondary | ICD-10-CM | POA: Diagnosis not present

## 2016-10-23 LAB — CULTURE, BLOOD (ROUTINE X 2): SPECIAL REQUESTS: ADEQUATE

## 2016-10-23 LAB — ECHOCARDIOGRAM COMPLETE
AVLVOTPG: 8 mmHg
CHL CUP DOP CALC LVOT VTI: 28.6 cm
E/e' ratio: 12.71
EWDT: 218 ms
FS: 40 % (ref 28–44)
HEIGHTINCHES: 70 in
IVS/LV PW RATIO, ED: 0.9
LA diam end sys: 45 mm
LA vol A4C: 56.7 ml
LA vol: 62 mL
LADIAMINDEX: 2.34 cm/m2
LASIZE: 45 mm
LAVOLIN: 32.3 mL/m2
LV E/e' medial: 12.71
LV TDI E'LATERAL: 6.64
LV e' LATERAL: 6.64 cm/s
LVEEAVG: 12.71
LVOT SV: 99 mL
LVOT area: 3.46 cm2
LVOTD: 21 mm
LVOTPV: 141 cm/s
Lateral S' vel: 14.5 cm/s
MV Dec: 218
MV Peak grad: 3 mmHg
MV pk A vel: 127 m/s
MV pk E vel: 84.4 m/s
PW: 13.4 mm — AB (ref 0.6–1.1)
TAPSE: 20.4 mm
TDI e' medial: 6.2
Weight: 2640 oz

## 2016-10-23 LAB — GLUCOSE, CAPILLARY
GLUCOSE-CAPILLARY: 219 mg/dL — AB (ref 65–99)
Glucose-Capillary: 213 mg/dL — ABNORMAL HIGH (ref 65–99)
Glucose-Capillary: 188 mg/dL — ABNORMAL HIGH (ref 65–99)
Glucose-Capillary: 275 mg/dL — ABNORMAL HIGH (ref 65–99)

## 2016-10-23 MED ORDER — GLIPIZIDE 5 MG PO TABS
5.0000 mg | ORAL_TABLET | Freq: Two times a day (BID) | ORAL | Status: DC
  Administered 2016-10-23 – 2016-10-26 (×7): 5 mg via ORAL
  Filled 2016-10-23 (×7): qty 1

## 2016-10-23 NOTE — Progress Notes (Signed)
PROGRESS NOTE    Wesley Ingram  UJW:[id number] DOB: 1946-03-05 DOA: 10/19/2016 PCP: Cleta Alberts, MD    Brief Narrative: Wesley Ingram is a 71 y.o. male with medical history significant of remote CVA with resultant left hemiparesis; HTN; hypertriglyceridemia; and DM presenting with persistent n/v.  Started day prior to admission.  Pain in periumbilical region, continued to vomit through the day today.  BP 158/94 today, glucose 254.  Total emesis x 10-15 times, 3-4 times today.  Also with retching, heaves. Specifically denies urinary symptoms.  Mild RLQ pain.  ED Course: Tachycardia and elevated WBC count; CT with ileus without SBO; also with large right kidney stone with obstruction and hydronephrosis; also with multiple lytic lesions concerning for multiple myeloma.    Assessment & Plan:   Principal Problem:   Ileus (HCC) Active Problems:   DM (diabetes mellitus) (HCC)   HTN (hypertension)   Hyperlipidemia   History of CVA (cerebrovascular accident)   Nephrolithiasis   Lytic bone lesions on xray   Acute renal failure (ARF) (HCC)   Chronic anticoagulation   1-Nephrolithiasis, Acute renal failure, UTI;  CT scan; 1 x 0.8 cm obstructing stone with resultant severe right-sided hydronephrosis Underwent ureteral stent placement 5-08 by Dr Berneice Heinrich.  Renal function improving.  Continue with IV antibiotics.  Urine culture; growing staphylococcus epidermidis. Blood culture grew staph epidermidis. He will need 2 weeks IV antibiotics.  Foley catheter removed.   Bacteremia, staphylococcus, Staphylococcus Epidermidis.  Continue with Vancomycin.  Repeated  blood cultures 5-11; pending. In negative in 24 to 48 hours could place Picc line.  Urine growing staph epidermidis, likely bacteremia from urine source.  Discussed with ID, patient will need 2 weeks of IV antibiotics.  ECHO; No evidence of vegetation.   2-Sepsis, secondary to UTI; Presents with leukocytosis, fever, tachycardia.  Source of infection UTI.  WBC has decreased to 9  from 29.  Blood culture positive for staph. Continue with vancomycin    3-Ileus, vs enteritis CT showed ileus vs enteritis.  KUB negative, he is tolerating clear diet.  IV fluids.  Resolved.   4-Multiples Bone lesion on CT scan;  protein electrophoresis negative for M spike.  Oncology consulted. Appreciate Dr Clelia Croft help. Patient will need outpatient follow up.   HTN;  Continue with metoprolol. Start low dose Norvasc.  Holding ARB due to AKI.   Chronic anticoagulation Unclear reason, ? Stroke.  Continue with eliquis for now.   DM;  SSI. CBG increasing, resume glipizide.   HLD; continue with lipitor.   h/o CVA -Has residual left hemiparesis -He is taking BB and ARB and statin   DVT prophylaxis: Eliquis Code Status: DNR Family Communication: care discussed with patient Disposition Plan: remain inpatient for IV antibiotics.    Consultants:   Dr Clelia Croft, oncology  Urology, Sebastian Ache   Procedures:  Stent placement   Antimicrobials:   Ceftriaxone 5-08   Subjective: He is considering SNF, wife work 12 hours a day.  Denies pain. Denies dyspnea.     Objective: Vitals:   10/22/16 0958 10/22/16 1411 10/22/16 2031 10/23/16 0444  BP: (!) 141/83 (!) 141/71 (!) 134/57 (!) 159/83  Pulse: 81 85 76 76  Resp:  20 18 18   Temp:  97.8 F (36.6 C) 98.4 F (36.9 C) 97.8 F (36.6 C)  TempSrc:  Oral Oral Oral  SpO2:  99% 96% 94%  Weight:      Height:        Intake/Output Summary (Last 24 hours) at 10/23/16 1419  Last data filed at 10/23/16 0809  Gross per 24 hour  Intake             1890 ml  Output             1200 ml  Net              690 ml   Filed Weights   10/19/16 1409  Weight: 74.8 kg (165 lb)    Examination:  General exam: NAD Respiratory system: CTA Cardiovascular system: S 1, S 2 RRR Gastrointestinal system: Soft, NT, ND Central nervous system: chronic left side hemiparesis    Extremities: no edema Skin; no rash      Data Reviewed: I have personally reviewed following labs and imaging studies  CBC:  Recent Labs Lab 10/19/16 1420 10/20/16 0612 10/21/16 0539 10/22/16 0603  WBC 29.9* 17.8* 10.1 9.0  HGB 14.3 11.9* 11.7* 12.0*  HCT 41.4 35.0* 35.1* 37.6*  MCV 86.4 87.3 88.4 88.3  PLT 277 198 182 207   Basic Metabolic Panel:  Recent Labs Lab 10/19/16 1420 10/20/16 0612 10/21/16 0539 10/22/16 0603  NA 135 135 138 137  K 4.1 3.7 3.6 3.7  CL 98* 104 106 102  CO2 24 21* 25 25  GLUCOSE 220* 158* 164* 236*  BUN 37* 34* 22* 14  CREATININE 1.89* 1.69* 1.11 1.01  CALCIUM 9.6 7.9* 7.9* 8.4*   GFR: Estimated Creatinine Clearance: 71.3 mL/min (by C-G formula based on SCr of 1.01 mg/dL). Liver Function Tests:  Recent Labs Lab 10/19/16 1420  AST 25  ALT 17  ALKPHOS 52  BILITOT 0.9  PROT 8.1  ALBUMIN 4.3    Recent Labs Lab 10/19/16 1420  LIPASE 30   No results for input(s): AMMONIA in the last 168 hours. Coagulation Profile:  Recent Labs Lab 10/19/16 2020  INR 1.39   Cardiac Enzymes: No results for input(s): CKTOTAL, CKMB, CKMBINDEX, TROPONINI in the last 168 hours. BNP (last 3 results) No results for input(s): PROBNP in the last 8760 hours. HbA1C: No results for input(s): HGBA1C in the last 72 hours. CBG:  Recent Labs Lab 10/22/16 1120 10/22/16 1617 10/22/16 2007 10/23/16 0732 10/23/16 1150  GLUCAP 288* 208* 307* 219* 213*   Lipid Profile: No results for input(s): CHOL, HDL, LDLCALC, TRIG, CHOLHDL, LDLDIRECT in the last 72 hours. Thyroid Function Tests: No results for input(s): TSH, T4TOTAL, FREET4, T3FREE, THYROIDAB in the last 72 hours. Anemia Panel: No results for input(s): VITAMINB12, FOLATE, FERRITIN, TIBC, IRON, RETICCTPCT in the last 72 hours. Sepsis Labs:  Recent Labs Lab 10/19/16 1539 10/19/16 1752 10/19/16 2020 10/19/16 2206  PROCALCITON  --   --  0.17  --   LATICACIDVEN 2.9* 2.4* 1.8 1.5     Recent Results (from the past 240 hour(s))  Culture, blood (x 2)     Status: Abnormal   Collection Time: 10/19/16  8:15 PM  Result Value Ref Range Status   Specimen Description BLOOD RIGHT ANTECUBITAL  Final   Special Requests IN PEDIATRIC BOTTLE Blood Culture adequate volume  Final   Culture  Setup Time   Final    GRAM POSITIVE COCCI IN PEDIATRIC BOTTLE CRITICAL RESULT CALLED TO, READ BACK BY AND VERIFIED WITH: Peggyann Juba PHARMD, AT [id number] 10/21/16 BY Renato Shin Performed at [facility], [address] [place], [place] [address]    Culture STAPHYLOCOCCUS EPIDERMIDIS (A)  Final   Report Status 10/23/2016 FINAL  Final   Organism ID, Bacteria STAPHYLOCOCCUS EPIDERMIDIS  Final  Susceptibility   Staphylococcus epidermidis - MIC*    CIPROFLOXACIN >=8 RESISTANT Resistant     ERYTHROMYCIN <=0.25 SENSITIVE Sensitive     GENTAMICIN <=0.5 SENSITIVE Sensitive     OXACILLIN >=4 RESISTANT Resistant     TETRACYCLINE 2 SENSITIVE Sensitive     VANCOMYCIN 1 SENSITIVE Sensitive     TRIMETH/SULFA 40 SENSITIVE Sensitive     CLINDAMYCIN <=0.25 SENSITIVE Sensitive     RIFAMPIN <=0.5 SENSITIVE Sensitive     Inducible Clindamycin NEGATIVE Sensitive     * STAPHYLOCOCCUS EPIDERMIDIS  Blood Culture ID Panel (Reflexed)     Status: Abnormal   Collection Time: 10/19/16  8:15 PM  Result Value Ref Range Status   Enterococcus species NOT DETECTED NOT DETECTED Final   Listeria monocytogenes NOT DETECTED NOT DETECTED Final   Staphylococcus species DETECTED (A) NOT DETECTED Final    Comment: Methicillin (oxacillin) resistant coagulase negative staphylococcus. Possible blood culture contaminant (unless isolated from more than one blood culture draw or clinical case suggests pathogenicity). No antibiotic treatment is indicated for blood  culture contaminants. CRITICAL RESULT CALLED TO, READ BACK BY AND VERIFIED WITH: JIllene Bolus PHARMD, AT [id number] 10/21/16 BY D. VANHOOK    Staphylococcus aureus NOT  DETECTED NOT DETECTED Final   Methicillin resistance DETECTED (A) NOT DETECTED Final    Comment: CRITICAL RESULT CALLED TO, READ BACK BY AND VERIFIED WITH: J. GRIMSLEY PHARMD, AT 3086 10/21/16 BY D. VANHOOK    Streptococcus species NOT DETECTED NOT DETECTED Final   Streptococcus agalactiae NOT DETECTED NOT DETECTED Final   Streptococcus pneumoniae NOT DETECTED NOT DETECTED Final   Streptococcus pyogenes NOT DETECTED NOT DETECTED Final   Acinetobacter baumannii NOT DETECTED NOT DETECTED Final   Enterobacteriaceae species NOT DETECTED NOT DETECTED Final   Enterobacter cloacae complex NOT DETECTED NOT DETECTED Final   Escherichia coli NOT DETECTED NOT DETECTED Final   Klebsiella oxytoca NOT DETECTED NOT DETECTED Final   Klebsiella pneumoniae NOT DETECTED NOT DETECTED Final   Proteus species NOT DETECTED NOT DETECTED Final   Serratia marcescens NOT DETECTED NOT DETECTED Final   Haemophilus influenzae NOT DETECTED NOT DETECTED Final   Neisseria meningitidis NOT DETECTED NOT DETECTED Final   Pseudomonas aeruginosa NOT DETECTED NOT DETECTED Final   Candida albicans NOT DETECTED NOT DETECTED Final   Candida glabrata NOT DETECTED NOT DETECTED Final   Candida krusei NOT DETECTED NOT DETECTED Final   Candida parapsilosis NOT DETECTED NOT DETECTED Final   Candida tropicalis NOT DETECTED NOT DETECTED Final    Comment: Performed at [facility], [address], [place], [place] [address]  Culture, blood (Routine X 2) w Reflex to ID Panel     Status: Abnormal   Collection Time: 10/19/16  8:57 PM  Result Value Ref Range Status   Specimen Description BLOOD BLOOD RIGHT HAND  Final   Special Requests   Final    IN PEDIATRIC BOTTLE Blood Culture results may not be optimal due to an inadequate volume of blood received in culture bottles   Culture  Setup Time   Final    PED GRAM POSITIVE COCCI IN CLUSTERS CRITICAL RESULT CALLED TO, READ BACK BY AND VERIFIED WITH: J. GRIMSLEY PHARMD, AT 9629 10/21/16  BY D. VANHOOK    Culture (A)  Final    STAPHYLOCOCCUS EPIDERMIDIS SUSCEPTIBILITIES PERFORMED ON PREVIOUS CULTURE WITHIN THE LAST 5 DAYS. Performed at [facility], [address] [place], [place] [address]    Report Status 10/23/2016 FINAL  Final  Surgical pcr screen     Status: None   Collection Time: 10/19/16 10:01 PM  Result Value Ref Range Status   MRSA, PCR NEGATIVE NEGATIVE Final   Staphylococcus aureus NEGATIVE NEGATIVE Final    Comment:        The Xpert SA Assay (FDA approved for NASAL specimens in patients over 40 years of age), is one component of a comprehensive surveillance program.  Test performance has been validated by [facility] for patients greater than or equal to 49 year old. It is not intended to diagnose infection nor to guide or monitor treatment.   Urine culture     Status: Abnormal   Collection Time: 10/19/16 11:09 PM  Result Value Ref Range Status   Specimen Description URINE, RANDOM CYSTOSCOPY  Final   Special Requests NONE  Final   Culture >=100,000 COLONIES/mL STAPHYLOCOCCUS EPIDERMIDIS (A)  Final   Report Status 10/22/2016 FINAL  Final   Organism ID, Bacteria STAPHYLOCOCCUS EPIDERMIDIS (A)  Final      Susceptibility   Staphylococcus epidermidis - MIC*    CIPROFLOXACIN >=8 RESISTANT Resistant     GENTAMICIN <=0.5 SENSITIVE Sensitive     NITROFURANTOIN <=16 SENSITIVE Sensitive     OXACILLIN >=4 RESISTANT Resistant     TETRACYCLINE 2 SENSITIVE Sensitive     VANCOMYCIN 1 SENSITIVE Sensitive     TRIMETH/SULFA 20 SENSITIVE Sensitive     CLINDAMYCIN <=0.25 SENSITIVE Sensitive     RIFAMPIN <=0.5 SENSITIVE Sensitive     Inducible Clindamycin NEGATIVE Sensitive     * >=100,000 COLONIES/mL STAPHYLOCOCCUS EPIDERMIDIS  Culture, blood (routine x 2)     Status: None (Preliminary result)   Collection Time: 10/22/16  9:00 AM  Result Value Ref Range Status   Specimen Description BLOOD RIGHT HAND  Final   Special Requests   Final    BOTTLES DRAWN  AEROBIC AND ANAEROBIC Blood Culture adequate volume   Culture   Final    NO GROWTH 1 DAY Performed at [facility], [address], [place], [place] [address]    Report Status PENDING  Incomplete  Culture, blood (routine x 2)     Status: None (Preliminary result)   Collection Time: 10/22/16  9:07 AM  Result Value Ref Range Status   Specimen Description BLOOD RIGHT HAND  Final   Special Requests   Final    BOTTLES DRAWN AEROBIC AND ANAEROBIC Blood Culture adequate volume   Culture   Final    NO GROWTH 1 DAY Performed at [facility], [address] [place], [place] [address]    Report Status PENDING  Incomplete         Radiology Studies: No results found.      Scheduled Meds: . amLODipine  2.5 mg Oral Daily  . apixaban  5 mg Oral BID  . atorvastatin  10 mg Oral QHS  . baclofen  10 mg Oral Daily  . chlorhexidine  15 mL Mouth Rinse BID  . gabapentin  200 mg Oral BH-q7a  . glipiZIDE  5 mg Oral BID AC  . insulin aspart  0-15 Units Subcutaneous TID WC  . insulin aspart  0-5 Units Subcutaneous QHS  . mouth rinse  15 mL Mouth Rinse q12n4p  . metoprolol succinate  12.5 mg Oral Daily  . omega-3 acid ethyl esters  1 g Oral Daily  . polyethylene glycol  17 g Oral Daily   Continuous Infusions: . vancomycin Stopped (10/23/16 0924)     LOS:  4 days    Time spent: 35 minutes.     Alba Cory, MD [facility] Pager [phone]  If 7PM-7AM, please contact night-coverage [email or web address] Password [facility] 10/23/2016, 2:19 PM

## 2016-10-23 NOTE — Progress Notes (Signed)
Physical Therapy Treatment Patient Details Name: Wesley Ingram MRN: [id number] DOB: [birth year] Today's Date: 10/23/2016    History of Present Illness 71 y.o. male with medical history significant of remote CVA with residual left hemiparesis; HTN; hypertriglyceridemia; and DM and admitted for ileus and Nephrolithiasis, Acute renal failure, UTI    PT Comments    Pt assisted with ambulating in hallway and able to tolerate a little bit farther distance today.  Pt reports he may need to d/c to SNF however would prefer home.  Follow Up Recommendations  Home health PT;Supervision for mobility/OOB;SNF (pt reports he may need to d/c to SNF for further IV meds and since he will be alone during day)     Equipment Recommendations  None recommended by PT    Recommendations for Other Services       Precautions / Restrictions Precautions Precautions: Fall    Mobility  Bed Mobility Overal bed mobility: Needs Assistance Bed Mobility: Supine to Sit     Supine to sit: HOB elevated;Min assist     General bed mobility comments: utilized rail to self assist howeve required assist for trunk upright today  Transfers Overall transfer level: Needs assistance Equipment used: Hemi-walker Transfers: Sit to/from Stand Sit to Stand: Min assist         General transfer comment: able to perform safe technique, min/guard for safety to rise, min assist to control descent  Ambulation/Gait Ambulation/Gait assistance: Min guard Ambulation Distance (Feet): 55 Feet Assistive device: Hemi-walker Gait Pattern/deviations: Step-to pattern;Decreased dorsiflexion - left;Decreased weight shift to left     General Gait Details: gait appears more steady today compared to yesterday and pt able to improve distance slightly, observed increased L foot drag end of ambulation due to fatigue, pt states today that he does have AFO however he is out of batteries for this and hasn't been using it   Social research officer, [employer] (Stroke Patients Only)       Balance                                            Cognition Arousal/Alertness: Awake/alert Behavior During Therapy: WFL for tasks assessed/performed Overall Cognitive Status: Within Functional Limits for tasks assessed                                        Exercises      General Comments        Pertinent Vitals/Pain Pain Assessment: No/denies pain    Home Living                      Prior Function            PT Goals (current goals can now be found in the care plan section) Progress towards PT goals: Progressing toward goals    Frequency    Min 3X/week      PT Plan Discharge plan needs to be updated    Co-evaluation              AM-PAC PT "6 Clicks" Daily Activity  Outcome Measure  Difficulty turning over in bed (including adjusting bedclothes, sheets and blankets)?: A Little Difficulty moving from lying on  back to sitting on the side of the bed? : A Little Difficulty sitting down on and standing up from a chair with arms (e.g., wheelchair, bedside commode, etc,.)?: A Little Help needed moving to and from a bed to chair (including a wheelchair)?: A Little Help needed walking in hospital room?: A Little Help needed climbing 3-5 steps with a railing? : A Lot 6 Click Score: 17    End of Session Equipment Utilized During Treatment: Gait belt Activity Tolerance: Patient tolerated treatment well Patient left: with call bell/phone within reach;in chair;with chair alarm set Nurse Communication: Mobility status (observed pt ambulating) PT Visit Diagnosis: Difficulty in walking, not elsewhere classified (R26.2);Muscle weakness (generalized) (M62.81)     Time: 8413-2440 PT Time Calculation (min) (ACUTE ONLY): 16 min  Charges:  $Gait Training: 8-22 mins                    G Codes:      Zenovia Jarred, PT, DPT 10/23/2016 Pager:  [phone]  Maida Sale E 10/23/2016, 1:32 PM

## 2016-10-23 NOTE — Progress Notes (Signed)
  Echocardiogram 2D Echocardiogram has been performed.  Wesley Ingram 10/23/2016, 9:10 AM

## 2016-10-24 ENCOUNTER — Inpatient Hospital Stay (HOSPITAL_COMMUNITY): Payer: BLUE CROSS/BLUE SHIELD

## 2016-10-24 DIAGNOSIS — N179 Acute kidney failure, unspecified: Secondary | ICD-10-CM | POA: Diagnosis not present

## 2016-10-24 DIAGNOSIS — A411 Sepsis due to other specified staphylococcus: Secondary | ICD-10-CM | POA: Diagnosis not present

## 2016-10-24 DIAGNOSIS — E860 Dehydration: Secondary | ICD-10-CM | POA: Diagnosis not present

## 2016-10-24 DIAGNOSIS — K567 Ileus, unspecified: Secondary | ICD-10-CM | POA: Diagnosis not present

## 2016-10-24 LAB — BASIC METABOLIC PANEL
ANION GAP: 9 (ref 5–15)
BUN: 22 mg/dL — ABNORMAL HIGH (ref 6–20)
CO2: 32 mmol/L (ref 22–32)
Calcium: 8.9 mg/dL (ref 8.9–10.3)
Chloride: 98 mmol/L — ABNORMAL LOW (ref 101–111)
Creatinine, Ser: 1.52 mg/dL — ABNORMAL HIGH (ref 0.61–1.24)
GFR calc Af Amer: 52 mL/min — ABNORMAL LOW (ref >60)
GFR calc non Af Amer: 45 mL/min — ABNORMAL LOW (ref >60)
GLUCOSE: 206 mg/dL — AB (ref 65–99)
Potassium: 4.2 mmol/L (ref 3.5–5.1)
Sodium: 139 mmol/L (ref 135–145)

## 2016-10-24 LAB — GLUCOSE, CAPILLARY
GLUCOSE-CAPILLARY: 274 mg/dL — AB (ref 65–99)
Glucose-Capillary: 187 mg/dL — ABNORMAL HIGH (ref 65–99)
Glucose-Capillary: 256 mg/dL — ABNORMAL HIGH (ref 65–99)
Glucose-Capillary: 204 mg/dL — ABNORMAL HIGH (ref 65–99)

## 2016-10-24 LAB — VANCOMYCIN, TROUGH: VANCOMYCIN TR: 25 ug/mL — AB (ref 15–20)

## 2016-10-24 MED ORDER — SENNOSIDES-DOCUSATE SODIUM 8.6-50 MG PO TABS
1.0000 | ORAL_TABLET | Freq: Two times a day (BID) | ORAL | Status: DC
  Administered 2016-10-24 – 2016-10-25 (×4): 1 via ORAL
  Filled 2016-10-24 (×5): qty 1

## 2016-10-24 MED ORDER — SODIUM CHLORIDE 0.9 % IV SOLN
INTRAVENOUS | Status: DC
  Administered 2016-10-24 – 2016-10-26 (×2): via INTRAVENOUS

## 2016-10-24 NOTE — Progress Notes (Signed)
Pharmacy Antibiotic Note  Wesley Ingram is a 71 y.o. male admitted on 10/19/2016 with MR staph epidermidis bacteremia.  Pharmacy has been consulted for vancomycin dosing.  Plan for 2 weeks IV vanc from first day of negative blood cultures.    Plan: currently Vancomycin 1gm IV q12h  due to increase in Scr (1>1.5)will check VT tonight (gaol 15-20)  Height: 5\' 10"  (177.8 cm) Weight: 165 lb (74.8 kg) IBW/kg (Calculated) : 73  Temp (24hrs), Avg:98.8 F (37.1 C), Min:98.6 F (37 C), Max:99 F (37.2 C)   Recent Labs Lab 10/19/16 1420 10/19/16 1539 10/19/16 1752 10/19/16 2020 10/19/16 2206 10/20/16 0612 10/21/16 0539 10/22/16 0603 10/22/16 1915 10/24/16 0700  WBC 29.9*  --   --   --   --  17.8* 10.1 9.0  --   --   CREATININE 1.89*  --   --   --   --  1.69* 1.11 1.01  --  1.52*  LATICACIDVEN  --  2.9* 2.4* 1.8 1.5  --   --   --   --   --   VANCOTROUGH  --   --   --   --   --   --   --   --  15  --     Estimated Creatinine Clearance: 47.4 mL/min (A) (by C-G formula based on SCr of 1.52 mg/dL (H)).    No Known Allergies  Antimicrobials this admission: CTX 5/8>>5/10 vanc 5/10>>  Dose adjustments this admission: 5/11 VT = 15 on 1 gm q12  Microbiology results: 5/8 UCx>> > 100K staph epidermidis, S clinda, gent, rif. TCN, Setpra, vanc 5/8 BCx2>>staph epidermidis, MR 5/8 BCID MR staphylococcus species,  not SA 5//8 MRSA PCR neg 5/11 BCxx2 >>  Thank you for allowing pharmacy to be a part of this patient's care.  Dolly Rias RPh 10/24/2016, 8:39 AM Pager [phone]

## 2016-10-24 NOTE — Progress Notes (Signed)
Notified by pharmacy to hold Vanc dose this evening. Will c/t monitor.

## 2016-10-24 NOTE — Progress Notes (Signed)
PHARMACY - VANCOMYCIN (brief note)  Patient on Vancomycin 1gm IV q12h for MRSE bacteremia.  SCr recently increased from 1.01 --> 1.52 and Vancomycin trough level ordered prior to this evening's dose  Vancomycin Trough: 25 mcg/ml (Goal 15-20 mcg/ml)  Assessment:  Elevated Vanc level reflects accumulation  Plan: D/C standing order for Vancomycin Check random Vancomycin level with AM labs on 5/13 Check SCr with AM labs on 5/13 Will reorder Vanc when level < 20 mcg/ml  Leone Haven, PharmD 10/24/16 @ 20:54

## 2016-10-24 NOTE — Progress Notes (Signed)
PROGRESS NOTE    Wesley Ingram  ZOX:[id number] DOB: 29-Oct-1945 DOA: 10/19/2016 PCP: Cleta Alberts, MD    Brief Narrative: Wesley Ingram is a 71 y.o. male with medical history significant of remote CVA with resultant left hemiparesis; HTN; hypertriglyceridemia; and DM presenting with persistent n/v.  Started day prior to admission.  Pain in periumbilical region, continued to vomit through the day today.  BP 158/94 today, glucose 254.  Total emesis x 10-15 times, 3-4 times today.  Also with retching, heaves. Specifically denies urinary symptoms.  Mild RLQ pain.  ED Course: Tachycardia and elevated WBC count; CT with ileus without SBO; also with large right kidney stone with obstruction and hydronephrosis; also with multiple lytic lesions concerning for multiple myeloma.    Assessment & Plan:   Principal Problem:   Ileus (HCC) Active Problems:   DM (diabetes mellitus) (HCC)   HTN (hypertension)   Hyperlipidemia   History of CVA (cerebrovascular accident)   Nephrolithiasis   Lytic bone lesions on xray   Acute renal failure (ARF) (HCC)   Chronic anticoagulation   1-Nephrolithiasis, Acute renal failure, UTI;  CT scan; 1 x 0.8 cm obstructing stone with resultant severe right-sided hydronephrosis Underwent ureteral stent placement 5-08 by Dr Berneice Heinrich.  Continue with IV antibiotics.  Urine culture; growing staphylococcus epidermidis. Blood culture grew staph epidermidis. He will need 2 weeks IV antibiotics. Discussed with ID.  Foley catheter removed.  Cr increase to 1.5. Resume IV fluids.  Repeat labs in am.   Bacteremia, staphylococcus, Staphylococcus Epidermidis.  Continue with Vancomycin.  Repeated  blood cultures 5-11; pending. In negative in 24 to 48 hours could place Picc line.  Urine growing staph epidermidis, likely bacteremia from urine source.  Discussed with ID, patient will need 2 weeks of IV antibiotics.  ECHO; No evidence of vegetation.  Blood culture from 5-11;  no  growth. If negative by tomorrow will need picc line.    2-Sepsis, secondary to UTI; Presents with leukocytosis, fever, tachycardia. Source of infection UTI.  WBC has decreased to 9  from 29.  Blood culture positive for staph. Continue with vancomycin    3-Ileus, vs enteritis CT showed ileus vs enteritis.  KUB negative, he is tolerating clear diet.  Develops nausea and vomiting last night. He had 2 BM yesterday, KUB today normal Bowel pattern.  Full liquid diet   4-Multiples Bone lesion on CT scan;  protein electrophoresis negative for M spike.  Oncology consulted. Appreciate Dr Clelia Croft help. Patient will need outpatient follow up.   HTN;  Continue with metoprolol. Start low dose Norvasc.  Holding ARB due to AKI.   Chronic anticoagulation Unclear reason, ? Stroke.  Continue with eliquis for now.   DM;  SSI. CBG increasing, resume glipizide.   HLD; continue with lipitor.   h/o CVA -Has residual left hemiparesis -He is taking BB and ARB and statin   DVT prophylaxis: Eliquis Code Status: DNR Family Communication: care discussed with patient and wife over phone.  Disposition Plan: remain inpatient for IV antibiotics.    Consultants:   Dr Clelia Croft, oncology  Urology, Sebastian Ache   Procedures:  Stent placement   Antimicrobials:   Ceftriaxone 5-08--5-10  Vancomycin 5-10   Subjective: Vomited last night. Feeling well this am. Asking for food.    Objective: Vitals:   10/23/16 1400 10/23/16 2112 10/24/16 0404 10/24/16 0920  BP: (!) 154/70 (!) 155/84  (!) 148/60  Pulse: 75 92 85 88  Resp: 18 20 20    Temp: 98.6 F (  37 C) 99 F (37.2 C) 98.8 F (37.1 C)   TempSrc: Oral Oral Oral   SpO2: 97% 95% 99%   Weight:      Height:        Intake/Output Summary (Last 24 hours) at 10/24/16 1433 Last data filed at 10/24/16 1100  Gross per 24 hour  Intake              640 ml  Output             1300 ml  Net             -660 ml   Filed Weights   10/19/16  1409  Weight: 74.8 kg (165 lb)    Examination:  General exam: NAD Respiratory system: CTA Cardiovascular system: s 1, S 2 RRR Gastrointestinal system: Soft, NT, ND Central nervous system: chronic left side hemiparesis  Extremities: No edema  Skin; no rash      Data Reviewed: I have personally reviewed following labs and imaging studies  CBC:  Recent Labs Lab 10/19/16 1420 10/20/16 0612 10/21/16 0539 10/22/16 0603  WBC 29.9* 17.8* 10.1 9.0  HGB 14.3 11.9* 11.7* 12.0*  HCT 41.4 35.0* 35.1* 37.6*  MCV 86.4 87.3 88.4 88.3  PLT 277 198 182 207   Basic Metabolic Panel:  Recent Labs Lab 10/19/16 1420 10/20/16 0612 10/21/16 0539 10/22/16 0603 10/24/16 0700  NA 135 135 138 137 139  K 4.1 3.7 3.6 3.7 4.2  CL 98* 104 106 102 98*  CO2 24 21* 25 25 32  GLUCOSE 220* 158* 164* 236* 206*  BUN 37* 34* 22* 14 22*  CREATININE 1.89* 1.69* 1.11 1.01 1.52*  CALCIUM 9.6 7.9* 7.9* 8.4* 8.9   GFR: Estimated Creatinine Clearance: 47.4 mL/min (A) (by C-G formula based on SCr of 1.52 mg/dL (H)). Liver Function Tests:  Recent Labs Lab 10/19/16 1420  AST 25  ALT 17  ALKPHOS 52  BILITOT 0.9  PROT 8.1  ALBUMIN 4.3    Recent Labs Lab 10/19/16 1420  LIPASE 30   No results for input(s): AMMONIA in the last 168 hours. Coagulation Profile:  Recent Labs Lab 10/19/16 2020  INR 1.39   Cardiac Enzymes: No results for input(s): CKTOTAL, CKMB, CKMBINDEX, TROPONINI in the last 168 hours. BNP (last 3 results) No results for input(s): PROBNP in the last 8760 hours. HbA1C: No results for input(s): HGBA1C in the last 72 hours. CBG:  Recent Labs Lab 10/23/16 1150 10/23/16 1638 10/23/16 2252 10/24/16 0738 10/24/16 1233  GLUCAP 213* 188* 275* 187* 256*   Lipid Profile: No results for input(s): CHOL, HDL, LDLCALC, TRIG, CHOLHDL, LDLDIRECT in the last 72 hours. Thyroid Function Tests: No results for input(s): TSH, T4TOTAL, FREET4, T3FREE, THYROIDAB in the last 72  hours. Anemia Panel: No results for input(s): VITAMINB12, FOLATE, FERRITIN, TIBC, IRON, RETICCTPCT in the last 72 hours. Sepsis Labs:  Recent Labs Lab 10/19/16 1539 10/19/16 1752 10/19/16 2020 10/19/16 2206  PROCALCITON  --   --  0.17  --   LATICACIDVEN 2.9* 2.4* 1.8 1.5    Recent Results (from the past 240 hour(s))  Culture, blood (x 2)     Status: Abnormal   Collection Time: 10/19/16  8:15 PM  Result Value Ref Range Status   Specimen Description BLOOD RIGHT ANTECUBITAL  Final   Special Requests IN PEDIATRIC BOTTLE Blood Culture adequate volume  Final   Culture  Setup Time   Final    GRAM POSITIVE COCCI IN PEDIATRIC BOTTLE CRITICAL  RESULT CALLED TO, READ BACK BY AND VERIFIED WITH: Peggyann Juba PHARMD, AT 7846 10/21/16 BY Renato Shin Performed at [facility], [address], [place], [place] [address]    Culture STAPHYLOCOCCUS EPIDERMIDIS (A)  Final   Report Status 10/23/2016 FINAL  Final   Organism ID, Bacteria STAPHYLOCOCCUS EPIDERMIDIS  Final      Susceptibility   Staphylococcus epidermidis - MIC*    CIPROFLOXACIN >=8 RESISTANT Resistant     ERYTHROMYCIN <=0.25 SENSITIVE Sensitive     GENTAMICIN <=0.5 SENSITIVE Sensitive     OXACILLIN >=4 RESISTANT Resistant     TETRACYCLINE 2 SENSITIVE Sensitive     VANCOMYCIN 1 SENSITIVE Sensitive     TRIMETH/SULFA 40 SENSITIVE Sensitive     CLINDAMYCIN <=0.25 SENSITIVE Sensitive     RIFAMPIN <=0.5 SENSITIVE Sensitive     Inducible Clindamycin NEGATIVE Sensitive     * STAPHYLOCOCCUS EPIDERMIDIS  Blood Culture ID Panel (Reflexed)     Status: Abnormal   Collection Time: 10/19/16  8:15 PM  Result Value Ref Range Status   Enterococcus species NOT DETECTED NOT DETECTED Final   Listeria monocytogenes NOT DETECTED NOT DETECTED Final   Staphylococcus species DETECTED (A) NOT DETECTED Final    Comment: Methicillin (oxacillin) resistant coagulase negative staphylococcus. Possible blood culture contaminant (unless isolated from more  than one blood culture draw or clinical case suggests pathogenicity). No antibiotic treatment is indicated for blood  culture contaminants. CRITICAL RESULT CALLED TO, READ BACK BY AND VERIFIED WITH: JIllene Bolus PHARMD, AT 2841 10/21/16 BY D. VANHOOK    Staphylococcus aureus NOT DETECTED NOT DETECTED Final   Methicillin resistance DETECTED (A) NOT DETECTED Final    Comment: CRITICAL RESULT CALLED TO, READ BACK BY AND VERIFIED WITH: J. GRIMSLEY PHARMD, AT 3244 10/21/16 BY D. VANHOOK    Streptococcus species NOT DETECTED NOT DETECTED Final   Streptococcus agalactiae NOT DETECTED NOT DETECTED Final   Streptococcus pneumoniae NOT DETECTED NOT DETECTED Final   Streptococcus pyogenes NOT DETECTED NOT DETECTED Final   Acinetobacter baumannii NOT DETECTED NOT DETECTED Final   Enterobacteriaceae species NOT DETECTED NOT DETECTED Final   Enterobacter cloacae complex NOT DETECTED NOT DETECTED Final   Escherichia coli NOT DETECTED NOT DETECTED Final   Klebsiella oxytoca NOT DETECTED NOT DETECTED Final   Klebsiella pneumoniae NOT DETECTED NOT DETECTED Final   Proteus species NOT DETECTED NOT DETECTED Final   Serratia marcescens NOT DETECTED NOT DETECTED Final   Haemophilus influenzae NOT DETECTED NOT DETECTED Final   Neisseria meningitidis NOT DETECTED NOT DETECTED Final   Pseudomonas aeruginosa NOT DETECTED NOT DETECTED Final   Candida albicans NOT DETECTED NOT DETECTED Final   Candida glabrata NOT DETECTED NOT DETECTED Final   Candida krusei NOT DETECTED NOT DETECTED Final   Candida parapsilosis NOT DETECTED NOT DETECTED Final   Candida tropicalis NOT DETECTED NOT DETECTED Final    Comment: Performed at [facility], [address] [place], [place] [address]  Culture, blood (Routine X 2) w Reflex to ID Panel     Status: Abnormal   Collection Time: 10/19/16  8:57 PM  Result Value Ref Range Status   Specimen Description BLOOD BLOOD RIGHT HAND  Final   Special Requests   Final    IN  PEDIATRIC BOTTLE Blood Culture results may not be optimal due to an inadequate volume of blood received in culture bottles   Culture  Setup Time   Final    PED GRAM POSITIVE COCCI IN CLUSTERS CRITICAL RESULT  CALLED TO, READ BACK BY AND VERIFIED WITH: J. GRIMSLEY PHARMD, AT 5366 10/21/16 BY D. VANHOOK    Culture (A)  Final    STAPHYLOCOCCUS EPIDERMIDIS SUSCEPTIBILITIES PERFORMED ON PREVIOUS CULTURE WITHIN THE LAST 5 DAYS. Performed at [facility], [address] [place], [place] [address]    Report Status 10/23/2016 FINAL  Final  Surgical pcr screen     Status: None   Collection Time: 10/19/16 10:01 PM  Result Value Ref Range Status   MRSA, PCR NEGATIVE NEGATIVE Final   Staphylococcus aureus NEGATIVE NEGATIVE Final    Comment:        The Xpert SA Assay (FDA approved for NASAL specimens in patients over 61 years of age), is one component of a comprehensive surveillance program.  Test performance has been validated by [facility] for patients greater than or equal to 3 year old. It is not intended to diagnose infection nor to guide or monitor treatment.   Urine culture     Status: Abnormal   Collection Time: 10/19/16 11:09 PM  Result Value Ref Range Status   Specimen Description URINE, RANDOM CYSTOSCOPY  Final   Special Requests NONE  Final   Culture >=100,000 COLONIES/mL STAPHYLOCOCCUS EPIDERMIDIS (A)  Final   Report Status 10/22/2016 FINAL  Final   Organism ID, Bacteria STAPHYLOCOCCUS EPIDERMIDIS (A)  Final      Susceptibility   Staphylococcus epidermidis - MIC*    CIPROFLOXACIN >=8 RESISTANT Resistant     GENTAMICIN <=0.5 SENSITIVE Sensitive     NITROFURANTOIN <=16 SENSITIVE Sensitive     OXACILLIN >=4 RESISTANT Resistant     TETRACYCLINE 2 SENSITIVE Sensitive     VANCOMYCIN 1 SENSITIVE Sensitive     TRIMETH/SULFA 20 SENSITIVE Sensitive     CLINDAMYCIN <=0.25 SENSITIVE Sensitive     RIFAMPIN <=0.5 SENSITIVE Sensitive     Inducible Clindamycin NEGATIVE Sensitive      * >=100,000 COLONIES/mL STAPHYLOCOCCUS EPIDERMIDIS  Culture, blood (routine x 2)     Status: None (Preliminary result)   Collection Time: 10/22/16  9:00 AM  Result Value Ref Range Status   Specimen Description BLOOD RIGHT HAND  Final   Special Requests   Final    BOTTLES DRAWN AEROBIC AND ANAEROBIC Blood Culture adequate volume   Culture   Final    NO GROWTH 2 DAYS Performed at [facility], [address] [place], [place] [address]    Report Status PENDING  Incomplete  Culture, blood (routine x 2)     Status: None (Preliminary result)   Collection Time: 10/22/16  9:07 AM  Result Value Ref Range Status   Specimen Description BLOOD RIGHT HAND  Final   Special Requests   Final    BOTTLES DRAWN AEROBIC AND ANAEROBIC Blood Culture adequate volume   Culture   Final    NO GROWTH 2 DAYS Performed at [facility], [address], [place], [place] [address]    Report Status PENDING  Incomplete         Radiology Studies: Dg Abd 1 View  Result Date: 10/24/2016 CLINICAL DATA:  Pt c/o n/v abd distention since last night, hx kidney stent placed 5 days ago, + Left sided paralysis, image done supine in recliner. EXAM: ABDOMEN - 1 VIEW COMPARISON:  KUB dated 10/20/2016. FINDINGS: Two supine views the abdomen are provided. Right sided nephroureteral stent appears stable in position. 10 mm calcification adjacent to the upper margin of the nephroureteral stent is presumably a renal  stone. Visualized bowel gas pattern is nonobstructive. No evidence of soft tissue mass or abnormal fluid collections seen. No evidence of free intraperitoneal air. Sclerotic lesion within the right iliac bone, of uncertain etiology as also described on earlier CT abdomen of 10/19/2016, favor benign bone island. IMPRESSION: 1. No acute findings seen.  Nonobstructive bowel gas pattern. 2. Right-sided nephroureteral stent appears stable in position. Presumed 10 mm right renal stone adjacent to the upper portion  of the stent. 3. Sclerotic lesion within the right iliac bone, uncertain etiology, favor benign bone island. Electronically Signed   By: Bary Richard M.D.   On: 10/24/2016 08:20        Scheduled Meds: . amLODipine  2.5 mg Oral Daily  . apixaban  5 mg Oral BID  . atorvastatin  10 mg Oral QHS  . baclofen  10 mg Oral Daily  . chlorhexidine  15 mL Mouth Rinse BID  . gabapentin  200 mg Oral [id number]  . glipiZIDE  5 mg Oral BID AC  . insulin aspart  0-15 Units Subcutaneous TID WC  . insulin aspart  0-5 Units Subcutaneous QHS  . mouth rinse  15 mL Mouth Rinse q12n4p  . metoprolol succinate  12.5 mg Oral Daily  . omega-3 acid ethyl esters  1 g Oral Daily  . polyethylene glycol  17 g Oral Daily  . senna-docusate  1 tablet Oral BID   Continuous Infusions: . sodium chloride 100 mL/hr at 10/24/16 1110  . vancomycin Stopped (10/24/16 0912)     LOS: 5 days    Time spent: 35 minutes.     Alba Cory, MD [facility] Pager [phone]  If 7PM-7AM, please contact night-coverage [email or web address] Password [facility] 10/24/2016, 2:33 PM

## 2016-10-24 NOTE — Progress Notes (Signed)
CRITICAL VALUE ALERT  Critical value received:  Vanc level 25  Date of notification:  10/24/16  Time of notification:  2003  Critical value read back:yes  Nurse who received alert: Roxan Hockey  MD notified (1st page): Hospitalist and Pharmacy aware and have held night Vanc dose  Time of first page:  2120  MD notified (2nd page):  Time of second page:  Responding MD:   Time MD responded:

## 2016-10-24 NOTE — Progress Notes (Signed)
Pt vomited approximately 600 cc of red fluid, (jello) and large amount of undigested fruit bits such as from a fruit cup.  Pt to only have sips clear liq the rest of shift and will notify MD in am. Will monitor and call MD if vomiting continues but once pt vomited he stated he felt much better. Makenli Derstine P Gil Ingwersen

## 2016-10-25 DIAGNOSIS — K567 Ileus, unspecified: Secondary | ICD-10-CM | POA: Diagnosis not present

## 2016-10-25 LAB — BASIC METABOLIC PANEL
Anion gap: 7 (ref 5–15)
BUN: 15 mg/dL (ref 6–20)
CO2: 28 mmol/L (ref 22–32)
CREATININE: 1.08 mg/dL (ref 0.61–1.24)
Calcium: 8.2 mg/dL — ABNORMAL LOW (ref 8.9–10.3)
Chloride: 103 mmol/L (ref 101–111)
GFR calc Af Amer: >60 mL/min (ref >60)
GLUCOSE: 268 mg/dL — AB (ref 65–99)
POTASSIUM: 4.2 mmol/L (ref 3.5–5.1)
Sodium: 138 mmol/L (ref 135–145)

## 2016-10-25 LAB — CREATININE, SERUM
CREATININE: 1.3 mg/dL — AB (ref 0.61–1.24)
GFR, EST NON AFRICAN AMERICAN: 54 mL/min — AB (ref >60)

## 2016-10-25 LAB — GLUCOSE, CAPILLARY
GLUCOSE-CAPILLARY: 232 mg/dL — AB (ref 65–99)
Glucose-Capillary: 237 mg/dL — ABNORMAL HIGH (ref 65–99)
Glucose-Capillary: 197 mg/dL — ABNORMAL HIGH (ref 65–99)
Glucose-Capillary: 185 mg/dL — ABNORMAL HIGH (ref 65–99)

## 2016-10-25 LAB — VANCOMYCIN, RANDOM: VANCOMYCIN RM: 18

## 2016-10-25 MED ORDER — SODIUM CHLORIDE 0.9% FLUSH
10.0000 mL | INTRAVENOUS | Status: DC | PRN
  Administered 2016-10-25 – 2016-10-26 (×3): 10 mL
  Filled 2016-10-25 (×3): qty 40

## 2016-10-25 MED ORDER — VANCOMYCIN HCL 10 G IV SOLR
1250.0000 mg | INTRAVENOUS | Status: DC
  Administered 2016-10-25 – 2016-10-26 (×2): 1250 mg via INTRAVENOUS
  Filled 2016-10-25 (×2): qty 1250

## 2016-10-25 NOTE — Progress Notes (Signed)
PROGRESS NOTE    Wesley Ingram  ION:[id number] DOB: 07-Aug-1945 DOA: 10/19/2016 PCP: Cleta Alberts, MD    Brief Narrative: Wesley Ingram is a 71 y.o. male with medical history significant of remote CVA with resultant left hemiparesis; HTN; hypertriglyceridemia; and DM presenting with persistent n/v.  Started day prior to admission.  Pain in periumbilical region, continued to vomit through the day today.  BP 158/94 today, glucose 254.  Total emesis x 10-15 times, 3-4 times today.  Also with retching, heaves. Specifically denies urinary symptoms.  Mild RLQ pain.  ED Course: Tachycardia and elevated WBC count; CT with ileus without SBO; also with large right kidney stone with obstruction and hydronephrosis; also with multiple lytic lesions concerning for multiple myeloma.    Assessment & Plan:   Principal Problem:   Ileus (HCC) Active Problems:   DM (diabetes mellitus) (HCC)   HTN (hypertension)   Hyperlipidemia   History of CVA (cerebrovascular accident)   Nephrolithiasis   Lytic bone lesions on xray   Acute renal failure (ARF) (HCC)   Chronic anticoagulation   1-Nephrolithiasis, Acute renal failure, UTI;  CT scan; 1 x 0.8 cm obstructing stone with resultant severe right-sided hydronephrosis Underwent ureteral stent placement 5-08 by Dr Berneice Heinrich.  Continue with IV antibiotics.  Urine culture; growing staphylococcus epidermidis. Blood culture grew staph epidermidis. He will need 2 weeks IV antibiotics. Discussed with ID.  Foley catheter removed.  Cr improved after IV fluids.   Bacteremia, staphylococcus, Staphylococcus Epidermidis.  Continue with Vancomycin.  Repeated  blood cultures 5-11; pending. In negative in 24 to 48 hours could place Picc line.  Urine growing staph epidermidis, likely bacteremia from urine source.  Discussed with ID, patient will need 2 weeks of IV antibiotics.  ECHO; No evidence of vegetation.  Blood culture from 5-11; remain no growth to date. Placed  picc line   2-Sepsis, secondary to UTI; Presents with leukocytosis, fever, tachycardia. Source of infection UTI.  WBC has decreased to 9  from 29.  Blood culture positive for staph. Continue with vancomycin    3-Ileus, vs enteritis CT showed ileus vs enteritis.  KUB negative, he is tolerating clear diet.  Develops nausea and vomiting 5-12.  KUB today normal Bowel pattern.  On full liquid diet today. Will advance to carb modified.   4-Multiples Bone lesion on CT scan;  protein electrophoresis negative for M spike.  Oncology consulted. Appreciate Dr Clelia Croft help. Patient will need outpatient follow up.   HTN;  Continue with metoprolol. And low dose Norvasc.  Holding ARB due to AKI.   Chronic anticoagulation Unclear reason, ? Stroke.  Continue with eliquis for now.   DM;  SSI. CBG increasing, resume glipizide.   HLD; continue with lipitor.   h/o CVA -Has residual left hemiparesis -He is taking BB and ARB and statin   DVT prophylaxis: Eliquis Code Status: DNR Family Communication: care discussed with patient and wife over phone.  Disposition Plan: advanced diet    Consultants:   Dr Clelia Croft, oncology  Urology, Sebastian Ache   Procedures:  Stent placement   Antimicrobials:   Ceftriaxone 5-08--5-10  Vancomycin 5-10   Subjective: Tolerating full liquid diet  Had BM today    Objective: Vitals:   10/24/16 1900 10/25/16 0541 10/25/16 1042 10/25/16 1422  BP: 123/66 125/69 132/89 (!) 156/83  Pulse: 82 70 80 93  Resp: 20 18  (!) 23  Temp: 98.5 F (36.9 C) 98.3 F (36.8 C)  98.4 F (36.9 C)  TempSrc: Oral Oral  Oral  SpO2: 96% 94%  99%  Weight:      Height:        Intake/Output Summary (Last 24 hours) at 10/25/16 1435 Last data filed at 10/25/16 1320  Gross per 24 hour  Intake             3905 ml  Output             1976 ml  Net             1929 ml   Filed Weights   10/19/16 1409  Weight: 74.8 kg (165 lb)    Examination:  General exam:  NAD Respiratory system: CTA Cardiovascular system: S 1, S 2, RRR Gastrointestinal system: Soft, NT, ND Central nervous system: chronic left side hemiparesis  Extremities: No edema  Skin; no rash      Data Reviewed: I have personally reviewed following labs and imaging studies  CBC:  Recent Labs Lab 10/19/16 1420 10/20/16 0612 10/21/16 0539 10/22/16 0603  WBC 29.9* 17.8* 10.1 9.0  HGB 14.3 11.9* 11.7* 12.0*  HCT 41.4 35.0* 35.1* 37.6*  MCV 86.4 87.3 88.4 88.3  PLT 277 198 182 207   Basic Metabolic Panel:  Recent Labs Lab 10/20/16 0612 10/21/16 0539 10/22/16 0603 10/24/16 0700 10/25/16 0515 10/25/16 1017  NA 135 138 137 139  --  138  K 3.7 3.6 3.7 4.2  --  4.2  CL 104 106 102 98*  --  103  CO2 21* 25 25 32  --  28  GLUCOSE 158* 164* 236* 206*  --  268*  BUN 34* 22* 14 22*  --  15  CREATININE 1.69* 1.11 1.01 1.52* 1.30* 1.08  CALCIUM 7.9* 7.9* 8.4* 8.9  --  8.2*   GFR: Estimated Creatinine Clearance: 66.7 mL/min (by C-G formula based on SCr of 1.08 mg/dL). Liver Function Tests:  Recent Labs Lab 10/19/16 1420  AST 25  ALT 17  ALKPHOS 52  BILITOT 0.9  PROT 8.1  ALBUMIN 4.3    Recent Labs Lab 10/19/16 1420  LIPASE 30   No results for input(s): AMMONIA in the last 168 hours. Coagulation Profile:  Recent Labs Lab 10/19/16 2020  INR 1.39   Cardiac Enzymes: No results for input(s): CKTOTAL, CKMB, CKMBINDEX, TROPONINI in the last 168 hours. BNP (last 3 results) No results for input(s): PROBNP in the last 8760 hours. HbA1C: No results for input(s): HGBA1C in the last 72 hours. CBG:  Recent Labs Lab 10/24/16 1233 10/24/16 1735 10/24/16 2224 10/25/16 0802 10/25/16 1221  GLUCAP 256* 274* 204* 237* 197*   Lipid Profile: No results for input(s): CHOL, HDL, LDLCALC, TRIG, CHOLHDL, LDLDIRECT in the last 72 hours. Thyroid Function Tests: No results for input(s): TSH, T4TOTAL, FREET4, T3FREE, THYROIDAB in the last 72 hours. Anemia Panel: No  results for input(s): VITAMINB12, FOLATE, FERRITIN, TIBC, IRON, RETICCTPCT in the last 72 hours. Sepsis Labs:  Recent Labs Lab 10/19/16 1539 10/19/16 1752 10/19/16 2020 10/19/16 2206  PROCALCITON  --   --  0.17  --   LATICACIDVEN 2.9* 2.4* 1.8 1.5    Recent Results (from the past 240 hour(s))  Culture, blood (x 2)     Status: Abnormal   Collection Time: 10/19/16  8:15 PM  Result Value Ref Range Status   Specimen Description BLOOD RIGHT ANTECUBITAL  Final   Special Requests IN PEDIATRIC BOTTLE Blood Culture adequate volume  Final   Culture  Setup Time   Final    GRAM POSITIVE  COCCI IN PEDIATRIC BOTTLE CRITICAL RESULT CALLED TO, READ BACK BY AND VERIFIED WITH: Peggyann Juba PHARMD, AT 6962 10/21/16 BY Renato Shin Performed at [facility], [address], [place], [place] [address]    Culture STAPHYLOCOCCUS EPIDERMIDIS (A)  Final   Report Status 10/23/2016 FINAL  Final   Organism ID, Bacteria STAPHYLOCOCCUS EPIDERMIDIS  Final      Susceptibility   Staphylococcus epidermidis - MIC*    CIPROFLOXACIN >=8 RESISTANT Resistant     ERYTHROMYCIN <=0.25 SENSITIVE Sensitive     GENTAMICIN <=0.5 SENSITIVE Sensitive     OXACILLIN >=4 RESISTANT Resistant     TETRACYCLINE 2 SENSITIVE Sensitive     VANCOMYCIN 1 SENSITIVE Sensitive     TRIMETH/SULFA 40 SENSITIVE Sensitive     CLINDAMYCIN <=0.25 SENSITIVE Sensitive     RIFAMPIN <=0.5 SENSITIVE Sensitive     Inducible Clindamycin NEGATIVE Sensitive     * STAPHYLOCOCCUS EPIDERMIDIS  Blood Culture ID Panel (Reflexed)     Status: Abnormal   Collection Time: 10/19/16  8:15 PM  Result Value Ref Range Status   Enterococcus species NOT DETECTED NOT DETECTED Final   Listeria monocytogenes NOT DETECTED NOT DETECTED Final   Staphylococcus species DETECTED (A) NOT DETECTED Final    Comment: Methicillin (oxacillin) resistant coagulase negative staphylococcus. Possible blood culture contaminant (unless isolated from more than one blood culture draw  or clinical case suggests pathogenicity). No antibiotic treatment is indicated for blood  culture contaminants. CRITICAL RESULT CALLED TO, READ BACK BY AND VERIFIED WITH: JIllene Bolus PHARMD, AT 1324 10/21/16 BY D. VANHOOK    Staphylococcus aureus NOT DETECTED NOT DETECTED Final   Methicillin resistance DETECTED (A) NOT DETECTED Final    Comment: CRITICAL RESULT CALLED TO, READ BACK BY AND VERIFIED WITH: J. GRIMSLEY PHARMD, AT 4010 10/21/16 BY D. VANHOOK    Streptococcus species NOT DETECTED NOT DETECTED Final   Streptococcus agalactiae NOT DETECTED NOT DETECTED Final   Streptococcus pneumoniae NOT DETECTED NOT DETECTED Final   Streptococcus pyogenes NOT DETECTED NOT DETECTED Final   Acinetobacter baumannii NOT DETECTED NOT DETECTED Final   Enterobacteriaceae species NOT DETECTED NOT DETECTED Final   Enterobacter cloacae complex NOT DETECTED NOT DETECTED Final   Escherichia coli NOT DETECTED NOT DETECTED Final   Klebsiella oxytoca NOT DETECTED NOT DETECTED Final   Klebsiella pneumoniae NOT DETECTED NOT DETECTED Final   Proteus species NOT DETECTED NOT DETECTED Final   Serratia marcescens NOT DETECTED NOT DETECTED Final   Haemophilus influenzae NOT DETECTED NOT DETECTED Final   Neisseria meningitidis NOT DETECTED NOT DETECTED Final   Pseudomonas aeruginosa NOT DETECTED NOT DETECTED Final   Candida albicans NOT DETECTED NOT DETECTED Final   Candida glabrata NOT DETECTED NOT DETECTED Final   Candida krusei NOT DETECTED NOT DETECTED Final   Candida parapsilosis NOT DETECTED NOT DETECTED Final   Candida tropicalis NOT DETECTED NOT DETECTED Final    Comment: Performed at [facility], [address] [place], [place] [address]  Culture, blood (Routine X 2) w Reflex to ID Panel     Status: Abnormal   Collection Time: 10/19/16  8:57 PM  Result Value Ref Range Status   Specimen Description BLOOD BLOOD RIGHT HAND  Final   Special Requests   Final    IN PEDIATRIC BOTTLE Blood Culture  results may not be optimal due to an inadequate volume of blood received in culture bottles   Culture  Setup Time   Final    PED GRAM POSITIVE  COCCI IN CLUSTERS CRITICAL RESULT CALLED TO, READ BACK BY AND VERIFIED WITH: J. GRIMSLEY PHARMD, AT 1610 10/21/16 BY D. VANHOOK    Culture (A)  Final    STAPHYLOCOCCUS EPIDERMIDIS SUSCEPTIBILITIES PERFORMED ON PREVIOUS CULTURE WITHIN THE LAST 5 DAYS. Performed at [facility], [address], [place], [place] [address]    Report Status 10/23/2016 FINAL  Final  Surgical pcr screen     Status: None   Collection Time: 10/19/16 10:01 PM  Result Value Ref Range Status   MRSA, PCR NEGATIVE NEGATIVE Final   Staphylococcus aureus NEGATIVE NEGATIVE Final    Comment:        The Xpert SA Assay (FDA approved for NASAL specimens in patients over 4 years of age), is one component of a comprehensive surveillance program.  Test performance has been validated by [facility] for patients greater than or equal to 54 year old. It is not intended to diagnose infection nor to guide or monitor treatment.   Urine culture     Status: Abnormal   Collection Time: 10/19/16 11:09 PM  Result Value Ref Range Status   Specimen Description URINE, RANDOM CYSTOSCOPY  Final   Special Requests NONE  Final   Culture >=100,000 COLONIES/mL STAPHYLOCOCCUS EPIDERMIDIS (A)  Final   Report Status 10/22/2016 FINAL  Final   Organism ID, Bacteria STAPHYLOCOCCUS EPIDERMIDIS (A)  Final      Susceptibility   Staphylococcus epidermidis - MIC*    CIPROFLOXACIN >=8 RESISTANT Resistant     GENTAMICIN <=0.5 SENSITIVE Sensitive     NITROFURANTOIN <=16 SENSITIVE Sensitive     OXACILLIN >=4 RESISTANT Resistant     TETRACYCLINE 2 SENSITIVE Sensitive     VANCOMYCIN 1 SENSITIVE Sensitive     TRIMETH/SULFA 20 SENSITIVE Sensitive     CLINDAMYCIN <=0.25 SENSITIVE Sensitive     RIFAMPIN <=0.5 SENSITIVE Sensitive     Inducible Clindamycin NEGATIVE Sensitive     * >=100,000 COLONIES/mL  STAPHYLOCOCCUS EPIDERMIDIS  Culture, blood (routine x 2)     Status: None (Preliminary result)   Collection Time: 10/22/16  9:00 AM  Result Value Ref Range Status   Specimen Description BLOOD RIGHT HAND  Final   Special Requests   Final    BOTTLES DRAWN AEROBIC AND ANAEROBIC Blood Culture adequate volume   Culture   Final    NO GROWTH 3 DAYS Performed at [facility], [address] [place], [place] [address]    Report Status PENDING  Incomplete  Culture, blood (routine x 2)     Status: None (Preliminary result)   Collection Time: 10/22/16  9:07 AM  Result Value Ref Range Status   Specimen Description BLOOD RIGHT HAND  Final   Special Requests   Final    BOTTLES DRAWN AEROBIC AND ANAEROBIC Blood Culture adequate volume   Culture   Final    NO GROWTH 3 DAYS Performed at [facility], [address] [place], [place] [address]    Report Status PENDING  Incomplete         Radiology Studies: Dg Abd 1 View  Result Date: 10/24/2016 CLINICAL DATA:  Pt c/o n/v abd distention since last night, hx kidney stent placed 5 days ago, + Left sided paralysis, image done supine in recliner. EXAM: ABDOMEN - 1 VIEW COMPARISON:  KUB dated 10/20/2016. FINDINGS: Two supine views the abdomen are provided. Right sided nephroureteral stent appears stable in position. 10 mm calcification adjacent to the upper margin of the nephroureteral  stent is presumably a renal stone. Visualized bowel gas pattern is nonobstructive. No evidence of soft tissue mass or abnormal fluid collections seen. No evidence of free intraperitoneal air. Sclerotic lesion within the right iliac bone, of uncertain etiology as also described on earlier CT abdomen of 10/19/2016, favor benign bone island. IMPRESSION: 1. No acute findings seen.  Nonobstructive bowel gas pattern. 2. Right-sided nephroureteral stent appears stable in position. Presumed 10 mm right renal stone adjacent to the upper portion of the stent. 3. Sclerotic  lesion within the right iliac bone, uncertain etiology, favor benign bone island. Electronically Signed   By: Bary Richard M.D.   On: 10/24/2016 08:20        Scheduled Meds: . amLODipine  2.5 mg Oral Daily  . apixaban  5 mg Oral BID  . atorvastatin  10 mg Oral QHS  . baclofen  10 mg Oral Daily  . chlorhexidine  15 mL Mouth Rinse BID  . gabapentin  200 mg Oral [id number]  . glipiZIDE  5 mg Oral BID AC  . insulin aspart  0-15 Units Subcutaneous TID WC  . insulin aspart  0-5 Units Subcutaneous QHS  . mouth rinse  15 mL Mouth Rinse q12n4p  . metoprolol succinate  12.5 mg Oral Daily  . omega-3 acid ethyl esters  1 g Oral Daily  . polyethylene glycol  17 g Oral Daily  . senna-docusate  1 tablet Oral BID   Continuous Infusions: . sodium chloride 100 mL/hr at 10/25/16 0543  . vancomycin Stopped (10/25/16 0950)     LOS: 6 days    Time spent: 35 minutes.     Alba Cory, MD [facility] Pager [phone]  If 7PM-7AM, please contact night-coverage [email or web address] Password [email or web address] 10/25/2016, 2:35 PM

## 2016-10-25 NOTE — Progress Notes (Signed)
[id number] y.o.malewith medical history significant of remote CVA with resultant left hemiparesis; HTN; hypertriglyceridemia; and DM presenting with persistent n/v. Started day prior to admission. Pain in periumbilical region, continued to vomit through the day today. BP 158/94 today, glucose 254. Total emesis x 10-15 times, 3-4 times today. Also with retching, heaves. Specifically denies urinary symptoms. Mild RLQ pain. Date:  Oct 25, 2016  Chart reviewed for concurrent status and case management needs.  Will continue to follow patient progress.  Discharge Planning: following for needs  Expected discharge date: [id number]  Velva Harman, BSN, [place], [place]

## 2016-10-25 NOTE — Progress Notes (Signed)
PHARMACY CONSULT NOTE FOR:  OUTPATIENT  PARENTERAL ANTIBIOTIC THERAPY (OPAT)  Indication: MR-Staph epi bacteremia Regimen: Vancomycin 1250 mg IV q24 hr End date: 11/04/16  IV antibiotic discharge orders are pended. To discharging provider:  please sign these orders via discharge navigator,  Select New Orders & click on the button choice - Manage This Unsigned Work.     Thank you for allowing pharmacy to be a part of this patient's care.  Lathaniel Legate A 10/25/2016, 1:10 PM

## 2016-10-25 NOTE — Progress Notes (Signed)
Inpatient Diabetes Program Recommendations  AACE/ADA: New Consensus Statement on Inpatient Glycemic Control (2015)  Target Ranges:  Prepandial:   less than 140 mg/dL      Peak postprandial:   less than 180 mg/dL (1-2 hours)      Critically ill patients:  140 - 180 mg/dL   Lab Results  Component Value Date   GLUCAP 197 (H) 10/25/2016   HGBA1C 6.6 (H) 10/19/2016    Review of Glycemic Control  Blood sugars 197-274 in past 24 hours. May benefit from addition of basal insulin.    Inpatient Diabetes Program Recommendations:   Add Levemir 8 units QHS.  Continue to follow.  Thank you. Lorenda Peck, RD, LDN, CDE Inpatient Diabetes Coordinator [phone]

## 2016-10-25 NOTE — Progress Notes (Signed)
Peripherally Inserted Central Catheter/Midline Placement  The IV Nurse has discussed with the patient and/or persons authorized to consent for the patient, the purpose of this procedure and the potential benefits and risks involved with this procedure.  The benefits include less needle sticks, lab draws from the catheter, and the patient may be discharged home with the catheter. Risks include, but not limited to, infection, bleeding, blood clot (thrombus formation), and puncture of an artery; nerve damage and irregular heartbeat and possibility to perform a PICC exchange if needed/ordered by physician.  Alternatives to this procedure were also discussed.  Bard Power PICC patient education guide, fact sheet on infection prevention and patient information card has been provided to patient /or left at bedside.    PICC/Midline Placement Documentation  PICC Single Lumen 10/25/16 PICC Right Brachial 43 cm 0 cm (Active)  Indication for Insertion or Continuance of Line Home intravenous therapies (PICC only) 10/25/2016  9:00 AM  Exposed Catheter (cm) 0 cm 10/25/2016  9:00 AM  Dressing Change Due 11/01/16 10/25/2016  9:00 AM       Jule Economy Horton 10/25/2016, 10:00 AM

## 2016-10-25 NOTE — NC FL2 (Signed)
[place] MEDICAID FL2 LEVEL OF CARE SCREENING TOOL     IDENTIFICATION  Patient Name: Wesley Ingram Birthdate: 11-Dec-1945 Sex: male Admission Date (Current Location): 10/19/2016  [facility] and [place] Number:  Geophysical data processor and Address:  [facility],  [address], [place] [address]      Provider Number: [id number]  Attending Physician Name and Address:  Alba Cory, MD  Relative Name and Phone Number:  Manolo, Seiler [phone]  [phone]     Current Level of Care: Hospital Recommended Level of Care: Skilled Nursing Facility Prior Approval Number:    Date Approved/Denied:   PASRR Number: [id number]  Discharge Plan: SNF    Current Diagnoses: Patient Active Problem List   Diagnosis Date Noted  . Ileus (HCC) 10/19/2016  . History of CVA (cerebrovascular accident) 10/19/2016  . Nephrolithiasis 10/19/2016  . Lytic bone lesions on xray 10/19/2016  . Acute renal failure (ARF) (HCC) 10/19/2016  . Chronic anticoagulation 10/19/2016  . TIA (transient ischemic attack) 02/25/2011  . Wound cellulitis 02/25/2011  . CVA (cerebral vascular accident) (HCC) 02/23/2011  . DM (diabetes mellitus) (HCC) 02/23/2011  . HTN (hypertension) 02/23/2011  . Hyperlipidemia 02/23/2011    Orientation RESPIRATION BLADDER Height & Weight     Self, Time, Situation, Place  Normal Continent, Indwelling catheter Weight: 165 lb (74.8 kg) Height:  5\' 10"  (177.8 cm)  BEHAVIORAL SYMPTOMS/MOOD NEUROLOGICAL BOWEL NUTRITION STATUS      Continent Diet (See Discharge Summary)  AMBULATORY STATUS COMMUNICATION OF NEEDS Skin   Limited Assist Verbally Normal                       Personal Care Assistance Level of Assistance  Bathing, Feeding, Dressing Bathing Assistance: Limited assistance Feeding assistance: Independent Dressing Assistance: Limited assistance     Functional Limitations Info  Sight, Hearing, Speech Sight Info: Adequate Hearing Info:  Adequate Speech Info: Adequate    SPECIAL CARE FACTORS FREQUENCY  PT (By licensed PT), OT (By licensed OT)     PT Frequency: 5 OT Frequency: 5            Contractures Contractures Info: Not present    Additional Factors Info  Code Status, Allergies Code Status Info: DNR Allergies Info: No Known Allergies           Current Medications (10/25/2016):  This is the current hospital active medication list Current Facility-Administered Medications  Medication Dose Route Frequency Provider Last Rate Last Dose  . 0.9 %  sodium chloride infusion   Intravenous Continuous Regalado, Belkys A, MD 100 mL/hr at 10/25/16 0543    . acetaminophen (TYLENOL) tablet 650 mg  650 mg Oral Q6H PRN Jonah Blue, MD       Or  . acetaminophen (TYLENOL) suppository 650 mg  650 mg Rectal Q6H PRN Jonah Blue, MD      . amLODipine (NORVASC) tablet 2.5 mg  2.5 mg Oral Daily Regalado, Belkys A, MD   2.5 mg at 10/24/16 0926  . apixaban (ELIQUIS) tablet 5 mg  5 mg Oral BID Jonah Blue, MD   5 mg at 10/24/16 2313  . atorvastatin (LIPITOR) tablet 10 mg  10 mg Oral Noemi Chapel, MD   10 mg at 10/24/16 2313  . baclofen (LIORESAL) tablet 10 mg  10 mg Oral Daily Regalado, Belkys A, MD   10 mg at 10/24/16 0926  . chlorhexidine (PERIDEX) 0.12 % solution 15 mL  15 mL Mouth Rinse BID Jonah Blue, MD  15 mL at 10/24/16 2312  . gabapentin (NEURONTIN) capsule 200 mg  200 mg Oral Wille Celeste, MD   200 mg at 10/25/16 0620  . glipiZIDE (GLUCOTROL) tablet 5 mg  5 mg Oral BID AC Regalado, Belkys A, MD   5 mg at 10/25/16 0820  . insulin aspart (novoLOG) injection 0-15 Units  0-15 Units Subcutaneous TID WC Jonah Blue, MD   5 Units at 10/25/16 0820  . insulin aspart (novoLOG) injection 0-5 Units  0-5 Units Subcutaneous QHS Jonah Blue, MD   2 Units at 10/24/16 2313  . MEDLINE mouth rinse  15 mL Mouth Rinse q12n4p Jonah Blue, MD   15 mL at 10/24/16 1630  . metoprolol succinate  (TOPROL-XL) 24 hr tablet 12.5 mg  12.5 mg Oral Daily Jonah Blue, MD   12.5 mg at 10/24/16 0981  . omega-3 acid ethyl esters (LOVAZA) capsule 1 g  1 g Oral Daily Jonah Blue, MD   1 g at 10/24/16 0926  . ondansetron (ZOFRAN) tablet 4 mg  4 mg Oral Q6H PRN Jonah Blue, MD       Or  . ondansetron [facility]) injection 4 mg  4 mg Intravenous Q6H PRN Jonah Blue, MD   4 mg at 10/23/16 2216  . polyethylene glycol (MIRALAX / GLYCOLAX) packet 17 g  17 g Oral Daily Regalado, Belkys A, MD   17 g at 10/24/16 0928  . promethazine (PHENERGAN) injection 12.5 mg  12.5 mg Intravenous Q6H PRN Jonah Blue, MD      . senna-docusate (Senokot-S) tablet 1 tablet  1 tablet Oral BID Regalado, Belkys A, MD   1 tablet at 10/24/16 2312  . vancomycin (VANCOCIN) 1,250 mg in sodium chloride 0.9 % 250 mL IVPB  1,250 mg Intravenous Q24H Regalado, Belkys A, MD 166.7 mL/hr at 10/25/16 0820 1,250 mg at 10/25/16 0820     Discharge Medications: Please see discharge summary for a list of discharge medications.  Relevant Imaging Results:  Relevant Lab Results:   Additional Information ssn:[id number]  Clearance Coots, LCSW

## 2016-10-25 NOTE — Progress Notes (Signed)
Pharmacy Antibiotic Note  Wesley Ingram is a 71 y.o. male admitted on 10/19/2016 with bacteremia.  Pharmacy has been consulted for Vancomycin dosing.  Plan: Change vancomycin to 1250mg  iv q24hr  Height: 5\' 10"  (177.8 cm) Weight: 165 lb (74.8 kg) IBW/kg (Calculated) : 73  Temp (24hrs), Avg:98.5 F (36.9 C), Min:98.3 F (36.8 C), Max:98.8 F (37.1 C)   Recent Labs Lab 10/19/16 1420 10/19/16 1539 10/19/16 1752 10/19/16 2020 10/19/16 2206 10/20/16 0612 10/21/16 0539 10/22/16 0603 10/22/16 1915 10/24/16 0700 10/24/16 1901 10/25/16 0515  WBC 29.9*  --   --   --   --  17.8* 10.1 9.0  --   --   --   --   CREATININE 1.89*  --   --   --   --  1.69* 1.11 1.01  --  1.52*  --  1.30*  LATICACIDVEN  --  2.9* 2.4* 1.8 1.5  --   --   --   --   --   --   --   VANCOTROUGH  --   --   --   --   --   --   --   --  15  --  25*  --   VANCORANDOM  --   --   --   --   --   --   --   --   --   --   --  18    Estimated Creatinine Clearance: 55.4 mL/min (A) (by C-G formula based on SCr of 1.3 mg/dL (H)).    No Known Allergies  Antimicrobials this admission: CTX 5/8>>5/10 vanc 5/10>>  Dose adjustments this admission: 5/11 VT = 15 on 1 gm q12 5/14 VR = 18 prior dosing 1gm iv q12hr (with 5/13 PM dose NOT given)  Microbiology results: 5/8 UCx>> > 100K staph epidermidis, S clinda, gent, rif. TCN, Setpra, vanc 5/8 BCx2>>staph epidermidis, MR 5/8 BCID MR staphylococcus species,  not SA 5//8 MRSA PCR neg 5/11 BCxx2 >>  Thank you for allowing pharmacy to be a part of this patient's care.  Nani Skillern Crowford 10/25/2016 7:04 AM

## 2016-10-25 NOTE — Clinical Social Work Note (Signed)
Clinical Social Work Assessment  Patient Details  Name: Wesley Ingram MRN: [id number] Date of Birth: 1946/01/18  Date of referral:  10/22/16               Reason for consult:  Facility Placement                Permission sought to share information with:  Family Supports Permission granted to share information::     Name::        Agency::     Relationship::     Contact Information:     Housing/Transportation Living arrangements for the past 2 months:  Single Family Home Source of Information:  Patient Patient Interpreter Needed:  None Criminal Activity/Legal Involvement Pertinent to Current Situation/Hospitalization:  No - Comment as needed Significant Relationships:  None Lives with:  Spouse, Adult Children Do you feel safe going back to the place where you live?  Yes Need for family participation in patient care:  Yes (Comment)  Care giving concerns:  SNF placement for Rehab.  Social Worker assessment / plan:  CSW met with patient at beside, inquired about patient wanting rehab at this time. CSW educated patient about different facilities in [place] and [place] area. CSW presented bed offers. Patient agreeable to go to facility close to his home in [place]. CSW explained patient will need prior insurance authorization before going to SNF.  Plan: Assist with patient discharge to SNF placement./ Obtain insurance authorization.   Employment status:    Forensic scientist:   (BCBS/Medicare Part A & B) PT Recommendations:  Home with [place] / Referral to community resources:     Patient/Family's Response to care:  Patient reports he is agreeable to continue physical therapy and antibiotics at [place].   Patient/Family's Understanding of and Emotional Response to Diagnosis, Current Treatment, and Prognosis: " I do not really want to go to a facility but I know my daughter and wife will not be home to help care for me."  Emotional  Assessment Appearance:  Developmentally appropriate Attitude/Demeanor/Rapport:    Affect (typically observed):  Calm, Pleasant Orientation:  Oriented to Self, Oriented to Place, Oriented to Situation, Oriented to  Time Alcohol / Substance use:  Not Applicable Psych involvement (Current and /or in the community):  No (Comment)  Discharge Needs  Concerns to be addressed:  Discharge Planning Concerns Readmission within the last 30 days:  No Current discharge risk:  None Barriers to Discharge:  Continued Medical Work up   [employer], [place] 10/25/2016, 9:23 AM

## 2016-10-25 NOTE — Progress Notes (Signed)
Physical Therapy Treatment Patient Details Name: Wesley Ingram MRN: [id number] DOB: 10-31-1945 Today's Date: 10/25/2016    History of Present Illness 71 y.o. male with medical history significant of remote CVA with residual left hemiparesis; HTN; hypertriglyceridemia; and DM and admitted for ileus and Nephrolithiasis, Acute renal failure, UTI    PT Comments    Pt progressing, agrees he may need SNF prior to returning home; he is quite motivated; will continue to follow;   Follow Up Recommendations  SNF (vs HHPT)     Equipment Recommendations  None recommended by PT    Recommendations for Other Services       Precautions / Restrictions Precautions Precautions: Fall Restrictions Weight Bearing Restrictions: No    Mobility  Bed Mobility Overal bed mobility: Needs Assistance Bed Mobility: Supine to Sit     Supine to sit: Min assist     General bed mobility comments: heavy min assist to elevate trunk  Transfers Overall transfer level: Needs assistance Equipment used: Hemi-walker Transfers: Sit to/from Stand Sit to Stand: Min assist         General transfer comment: able to perform safe technique, min/guard for safety to rise, min assist to control descent  Ambulation/Gait Ambulation/Gait assistance: Min guard;Min assist Ambulation Distance (Feet): 80 Feet Assistive device: Hemi-walker Gait Pattern/deviations: Step-to pattern;Decreased dorsiflexion - left;Decreased weight shift to left;Narrow base of support Gait velocity: decr   General Gait Details: intermittent assist to advance LLE as pt fatigued, LOB x 2 with min assist to recover   Stairs            Wheelchair Mobility    Modified Rankin (Stroke Patients Only)       Balance Overall balance assessment: Needs assistance         Standing balance support: Single extremity supported Standing balance-Leahy Scale: Poor Standing balance comment: reliant on RUE support for balance; LOB x2  during gait                            Cognition Arousal/Alertness: Awake/alert Behavior During Therapy: WFL for tasks assessed/performed Overall Cognitive Status: Within Functional Limits for tasks assessed                                        Exercises      General Comments        Pertinent Vitals/Pain Pain Assessment: No/denies pain    Home Living                      Prior Function            PT Goals (current goals can now be found in the care plan section) Acute Rehab PT Goals Patient Stated Goal: to go to rehab PT Goal Formulation: With patient Time For Goal Achievement: 10/29/16 Potential to Achieve Goals: Good Progress towards PT goals: Progressing toward goals    Frequency    Min 3X/week      PT Plan Current plan remains appropriate    Co-evaluation              AM-PAC PT "6 Clicks" Daily Activity  Outcome Measure  Difficulty turning over in bed (including adjusting bedclothes, sheets and blankets)?: A Little Difficulty moving from lying on back to sitting on the side of the bed? : A Little Difficulty sitting down on and  standing up from a chair with arms (e.g., wheelchair, bedside commode, etc,.)?: A Little Help needed moving to and from a bed to chair (including a wheelchair)?: A Little Help needed walking in hospital room?: A Little Help needed climbing 3-5 steps with a railing? : A Lot 6 Click Score: 17    End of Session Equipment Utilized During Treatment: Gait belt Activity Tolerance: Patient tolerated treatment well Patient left: in chair;with call bell/phone within reach;with chair alarm set   PT Visit Diagnosis: Difficulty in walking, not elsewhere classified (R26.2);Muscle weakness (generalized) (M62.81)     Time: 4098-1191 PT Time Calculation (min) (ACUTE ONLY): 23 min  Charges:  $Gait Training: 23-37 mins                    G CodesTruitt Cifarelli, PT Pager:  [phone] 10/25/2016    Drucilla Chalet 10/25/2016, 2:01 PM

## 2016-10-26 DIAGNOSIS — K567 Ileus, unspecified: Secondary | ICD-10-CM | POA: Diagnosis not present

## 2016-10-26 LAB — BASIC METABOLIC PANEL
ANION GAP: 6 (ref 5–15)
BUN: 14 mg/dL (ref 6–20)
CHLORIDE: 106 mmol/L (ref 101–111)
CO2: 27 mmol/L (ref 22–32)
CREATININE: 1.12 mg/dL (ref 0.61–1.24)
Calcium: 8.5 mg/dL — ABNORMAL LOW (ref 8.9–10.3)
GFR calc non Af Amer: >60 mL/min (ref >60)
Glucose, Bld: 192 mg/dL — ABNORMAL HIGH (ref 65–99)
POTASSIUM: 4.1 mmol/L (ref 3.5–5.1)
SODIUM: 139 mmol/L (ref 135–145)

## 2016-10-26 LAB — GLUCOSE, CAPILLARY
Glucose-Capillary: 199 mg/dL — ABNORMAL HIGH (ref 65–99)
Glucose-Capillary: 214 mg/dL — ABNORMAL HIGH (ref 65–99)

## 2016-10-26 MED ORDER — INSULIN GLARGINE 100 UNIT/ML ~~LOC~~ SOLN: 8.0000 [IU] | Freq: Every day | SUBCUTANEOUS | 11 refills | Status: DC

## 2016-10-26 MED ORDER — SENNOSIDES-DOCUSATE SODIUM 8.6-50 MG PO TABS: 1.0000 | ORAL_TABLET | Freq: Two times a day (BID) | ORAL | 0 refills | Status: DC

## 2016-10-26 MED ORDER — INSULIN GLARGINE 100 UNIT/ML ~~LOC~~ SOLN
8.0000 [IU] | Freq: Every day | SUBCUTANEOUS | Status: DC
  Administered 2016-10-26: 8 [IU] via SUBCUTANEOUS
  Filled 2016-10-26: qty 0.08

## 2016-10-26 MED ORDER — AMLODIPINE BESYLATE 2.5 MG PO TABS: 2.5000 mg | ORAL_TABLET | Freq: Every day | ORAL | 0 refills | Status: DC

## 2016-10-26 MED ORDER — VANCOMYCIN IV (FOR PTA / DISCHARGE USE ONLY): 1250.0000 mg | INTRAVENOUS | 0 refills | Status: DC

## 2016-10-26 MED ORDER — POLYETHYLENE GLYCOL 3350 17 G PO PACK: 17.0000 g | PACK | Freq: Every day | ORAL | 0 refills | Status: DC

## 2016-10-26 MED ORDER — LIP MEDEX EX OINT
TOPICAL_OINTMENT | CUTANEOUS | Status: DC | PRN
  Filled 2016-10-26: qty 7

## 2016-10-26 NOTE — Discharge Summary (Signed)
Physician Discharge Summary  Wesley Ingram RJJ:[id number] DOB: 10/15/1945 DOA: 10/19/2016  PCP: Cleta Alberts, MD  Admit date: 10/19/2016 Discharge date: 10/26/2016  Admitted From: Home  Disposition:  Home   Recommendations for Outpatient Follow-up:  1. Follow up with PCP in 1-2 weeks 2. Please obtain BMP/CBC in one week 3. Please follow up on the following pending results: final blood culture results.  4. Needs t follow up with Dr Berneice Heinrich for further treatment of kidney stone  5. Needs to follow up with Dr Clelia Croft for further evaluation of bone lytic lesions.     Discharge Condition; Stable.  CODE STATUS: DNR Diet recommendation: Heart Healthy  Brief/Interim Summary: Wesley Ingram a 71 y.o.malewith medical history significant of remote CVA with resultant left hemiparesis; HTN; hypertriglyceridemia; and DM presenting with persistent n/v. Started day prior to admission. Pain in periumbilical region, continued to vomit through the day today. BP 158/94 today, glucose 254. Total emesis x 10-15 times, 3-4 times today. Also with retching, heaves. Specifically denies urinary symptoms. Mild RLQ pain.  ED Course:Tachycardia and elevated WBC count; CT with ileus without SBO; also with large right kidney stone with obstruction and hydronephrosis; also with multiple lytic lesions concerning for multiple myeloma.    Assessment & Plan:   Principal Problem:   Ileus (HCC) Active Problems:   DM (diabetes mellitus) (HCC)   HTN (hypertension)   Hyperlipidemia   History of CVA (cerebrovascular accident)   Nephrolithiasis   Lytic bone lesions on xray   Acute renal failure (ARF) (HCC)   Chronic anticoagulation   1-Nephrolithiasis, Acute renal failure, UTI;  CT scan; 1 x 0.8 cm obstructing stone with resultant severe right-sided hydronephrosis Underwent ureteral stent placement 5-08 by Dr Berneice Heinrich.  Continue with IV antibiotics.  Urine culture; growing staphylococcus  epidermidis. Blood culture grew staph epidermidis. He will need 2 weeks IV antibiotics. Discussed with ID.  Foley catheter removed.  Cr improved after IV fluids.   Bacteremia, staphylococcus, Staphylococcus Epidermidis.  Continue with Vancomycin.  Repeated  blood cultures 5-11; pending. In negative in 24 to 48 hours could place Picc line.  Urine growing staph epidermidis, likely bacteremia from urine source.  Discussed with ID, patient will need 2 weeks of IV antibiotics.  ECHO; No evidence of vegetation.  Blood culture from 5-11; remain no growth to date. Placed picc line Need  2 weeks of IV vancomycin   2-Sepsis, secondary to UTI; Presents with leukocytosis, fever, tachycardia. Source of infection UTI.  WBC has decreased to 9  from 29.  Blood culture positive for staph. Continue with vancomycin    3-Ileus, vs enteritis CT showed ileus vs enteritis.  KUB negative, he is tolerating clear diet.  Develops nausea and vomiting 5-12.  KUB today normal Bowel pattern.  Tolerating carb modified diet   4-Multiples Bone lesion on CT scan;  protein electrophoresis negative for M spike.  Oncology consulted. Appreciate Dr Clelia Croft help. Patient will need outpatient follow up.   HTN;  Continue with metoprolol. Started  low dose Norvasc.  Holding ARB due to AKI.   Chronic anticoagulation Unclear reason, ? Stroke.  Continue with eliquis at discharge   DM;  SSI. , resume glipizide.  low dose lantus started   HLD; continue with lipitor.   h/o CVA -Has residual left hemiparesis -He is taking BB and ARB and statin   Discharge Diagnoses:  Principal Problem:   Ileus (HCC) Active Problems:   DM (diabetes mellitus) (HCC)   HTN (hypertension)   Hyperlipidemia  History of CVA (cerebrovascular accident)   Nephrolithiasis   Lytic bone lesions on xray   Acute renal failure (ARF) (HCC)   Chronic anticoagulation    Discharge Instructions  Discharge Instructions    Diet -  low sodium heart healthy    Complete by:  As directed    Home infusion instructions [facility] May follow [facility] Dosing Protocol; May administer Cathflo as needed to maintain patency of vascular access device.; Flushing of vascular access device: per [facility] Protocol: 0.9% NaCl pre/post medica...    Complete by:  As directed    Instructions:  May follow [facility] Dosing Protocol   Instructions:  May administer Cathflo as needed to maintain patency of vascular access device.   Instructions:  Flushing of vascular access device: per [facility] Protocol: 0.9% NaCl pre/post medication administration and prn patency; Heparin 100 u/ml, 5ml for implanted ports and Heparin 10u/ml, 5ml for all other central venous catheters.   Instructions:  May follow [facility] Anaphylaxis Protocol for First Dose Administration in the home: 0.9% NaCl at 25-50 ml/hr to maintain IV access for protocol meds. Epinephrine 0.3 ml IV/IM PRN and Benadryl 25-50 IV/IM PRN s/s of anaphylaxis.   Instructions:  [facility] Infusion Coordinator (RN) to assist per patient IV care needs in the home PRN.   Increase activity slowly    Complete by:  As directed      Allergies as of 10/26/2016   No Known Allergies     Medication List    STOP taking these medications   dipyridamole-aspirin 200-25 MG 12hr capsule Commonly known as:  AGGRENOX   losartan 100 MG tablet Commonly known as:  COZAAR     TAKE these medications   ACIDOPHILUS PO Take 1 capsule by mouth 2 (two) times daily.   amLODipine 2.5 MG tablet Commonly known as:  NORVASC Take 1 tablet (2.5 mg total) by mouth daily. Start taking on:  10/27/2016   apixaban 5 MG Tabs tablet Commonly known as:  ELIQUIS Take 5 mg by mouth 2 (two) times daily.   atorvastatin 20 MG tablet Commonly known as:  LIPITOR Take 10 mg by mouth at bedtime.   baclofen 10 MG tablet Commonly known as:  LIORESAL Take 10 mg by mouth daily.   Fish Oil 1000 MG Caps Take 1 capsule by  mouth daily.   gabapentin 100 MG capsule Commonly known as:  NEURONTIN Take 200 mg by mouth every morning.   glipiZIDE 5 MG tablet Commonly known as:  GLUCOTROL Take 5 mg by mouth 2 (two) times daily before a meal.   insulin glargine 100 UNIT/ML injection Commonly known as:  LANTUS Inject 0.08 mLs (8 Units total) into the skin daily. Start taking on:  10/27/2016   Melatonin 1 MG Tabs Take 3 mg by mouth at bedtime. HOLD WHILE IN HOSPITAL   metFORMIN 1000 MG tablet Commonly known as:  GLUCOPHAGE Take 1,000 mg by mouth 2 (two) times daily with a meal.   metoprolol succinate 12.5 mg Tb24 24 hr tablet Commonly known as:  TOPROL-XL Take 12.5 mg by mouth daily.   polyethylene glycol packet Commonly known as:  MIRALAX / GLYCOLAX Take 17 g by mouth daily. Start taking on:  10/27/2016   senna-docusate 8.6-50 MG tablet Commonly known as:  Senokot-S Take 1 tablet by mouth 2 (two) times daily.   vancomycin IVPB Inject 1,250 mg into the vein daily. Indication:  S. Epi bacteremia Last Day of Therapy:  5/24 Labs - Sunday/Monday:  CBC/D, BMP, and vancomycin trough. Labs - Thursday:  BMP and vancomycin trough Labs - Every other week:  ESR and CRP            Home Infusion Instuctions        Start     Ordered   10/26/16 0000  Home infusion instructions [facility] May follow [facility] Dosing Protocol; May administer Cathflo as needed to maintain patency of vascular access device.; Flushing of vascular access device: per [facility] Protocol: 0.9% NaCl pre/post medica...    Question Answer Comment  Instructions May follow [facility] Dosing Protocol   Instructions May administer Cathflo as needed to maintain patency of vascular access device.   Instructions Flushing of vascular access device: per [facility] Protocol: 0.9% NaCl pre/post medication administration and prn patency; Heparin 100 u/ml, 5ml for implanted ports and Heparin 10u/ml, 5ml for all other central venous catheters.    Instructions May follow [facility] Anaphylaxis Protocol for First Dose Administration in the home: 0.9% NaCl at 25-50 ml/hr to maintain IV access for protocol meds. Epinephrine 0.3 ml IV/IM PRN and Benadryl 25-50 IV/IM PRN s/s of anaphylaxis.   Instructions [facility] Infusion Coordinator (RN) to assist per patient IV care needs in the home PRN.      10/26/16 1100     Follow-up Information    [facility] Follow up.   Why:  HH physical therapy Contact information: [address] [place] [address] [phone]        Sebastian Ache, MD Follow up.   Specialty:  Urology Why:  Office will call to arrange surgery for kidney stone in few weeks.  Contact information: [address] [place] [address] [phone]        Benjiman Core, MD Follow up in 2 week(s).   Specialty:  Oncology Contact information: [address] [place] [address] [phone]          No Known Allergies  Consultations:  Urology  Oncology    Procedures/Studies: Dg Abd 1 View  Result Date: 10/24/2016 CLINICAL DATA:  Pt c/o n/v abd distention since last night, hx kidney stent placed 5 days ago, + Left sided paralysis, image done supine in recliner. EXAM: ABDOMEN - 1 VIEW COMPARISON:  KUB dated 10/20/2016. FINDINGS: Two supine views the abdomen are provided. Right sided nephroureteral stent appears stable in position. 10 mm calcification adjacent to the upper margin of the nephroureteral stent is presumably a renal stone. Visualized bowel gas pattern is nonobstructive. No evidence of soft tissue mass or abnormal fluid collections seen. No evidence of free intraperitoneal air. Sclerotic lesion within the right iliac bone, of uncertain etiology as also described on earlier CT abdomen of 10/19/2016, favor benign bone island. IMPRESSION: 1. No acute findings seen.  Nonobstructive bowel gas pattern. 2. Right-sided nephroureteral stent appears stable in  position. Presumed 10 mm right renal stone adjacent to the upper portion of the stent. 3. Sclerotic lesion within the right iliac bone, uncertain etiology, favor benign bone island. Electronically Signed   By: Bary Richard M.D.   On: 10/24/2016 08:20   Dg Abd 1 View  Result Date: 10/20/2016 CLINICAL DATA:  Abdominal pain and nausea. Right double-J stent placement EXAM: ABDOMEN - 1 VIEW COMPARISON:  CT abdomen and pelvis Oct 19, 2016 FINDINGS: Double-J stent extends from the level of the right renal pelvis to the bladder. The previously noted calculus at the right renal pelvis level is no longer evident.  There is moderate stool in the colon. There is no bowel dilatation or air-fluid level to suggest bowel obstruction. No free air. Sclerotic focus noted in right iliac crest. IMPRESSION: Double-J stent placed on right without evidence of calculus on the right currently by radiography. Bowel gas pattern unremarkable. Sclerotic focus again noted right iliac crest. Electronically Signed   By: Bretta Bang III M.D.   On: 10/20/2016 13:50   Ct Abdomen Pelvis W Contrast  Result Date: 10/19/2016 CLINICAL DATA:  Abdominal pain and vomiting EXAM: CT ABDOMEN AND PELVIS WITH CONTRAST TECHNIQUE: Multidetector CT imaging of the abdomen and pelvis was performed using the standard protocol following bolus administration of intravenous contrast. CONTRAST:  75mL ISOVUE-300 IOPAMIDOL (ISOVUE-300) INJECTION 61% COMPARISON:  None. FINDINGS: Lower chest: There is patchy bibasilar atelectasis. No consolidation in the lung bases. There are foci of atherosclerotic calcification. There is a small hiatal hernia. Hepatobiliary: No focal liver lesions are appreciable. Gallbladder wall is not appreciably thickened. There is no biliary duct dilatation. Pancreas: No pancreatic mass or inflammatory focus. Spleen: No splenic lesions are evident. There is a tiny splenule medial to the spleen anteriorly. 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 Left adrenal appears normal. There is no renal mass on either side. Right kidney is edematous with severe hydronephrosis. There is a calculus in the proximal right ureter immediately distal to the ureteropelvic junction measuring 1.0 x 0.8 cm. There is no intrarenal calculus on the right. On the left, there is a 5 x 3 mm calculus in the lower pole left kidney, nonobstructing. There is no ureteral calculus on the left. The urinary bladder has a mildly thickened wall with an irregular contour. There are several small diverticula arising from the urinary bladder. No mass is evident in the urinary bladder. Stomach/Bowel: Several loops of jejunum show mildly thickened walls. There is no appreciable bowel obstruction. No free air or portal venous air. There are occasional colonic diverticular without diverticulitis. Vascular/Lymphatic: There is atherosclerotic calcification in the aorta and common iliac arteries without evident aneurysm. Major mesenteric vessels appear patent. No adenopathy is appreciable in the abdomen or pelvis. Reproductive: Prostate and seminal vesicles appear unremarkable. No pelvic mass. Other: There is moderate fat in the right inguinal ring. There is evidence of previous inguinal hernia repairs bilaterally. There is a minimal ventral hernia containing only fat. Appendix appears unremarkable. There is no ascites or abscess in the abdomen or pelvis. Musculoskeletal: There are small lytic appearing lesions throughout the pelvis and proximal femurs. There is a sclerotic focus in the mid right iliac crest. Several small lucent lesions are noted in the lumbar spine. There is degenerative change in the lumbar spine. There is severe spinal stenosis at L4-5 due to diffuse disc protrusion and bony hypertrophy. No intramuscular or abdominal wall lesion evident. IMPRESSION: 1 x 0.8 cm calculus just distal to the right ureteropelvic junction with severe hydronephrosis on the  right. There is right renal edema. Nonobstructing 5 x 3 mm calculus lower pole left kidney. Urinary bladder wall is irregular and thickened without well-defined mass. Several small diverticulum noted. Suspect cystitis. Given the irregularity of the urinary bladder wall, correlation with cystoscopy may well be advisable. Multiple small lytic appearing lesions are noted throughout the bony structures, concerning for underlying neoplasia/metastatic disease. Etiology uncertain. Sclerotic focus in the mid left iliac bone also noted. No similar sclerotic lesions evident. Given the appearance of the bony structures, it may be prudent to obtain serum electrophoresis  to assess for possible multiple myeloma. Several loops of prominent jejunum, likely due to early ileus or enteritis. No bowel obstruction evident. No evident focal liver lesions. Small hiatal hernia. Small ventral hernia containing only fat. Fat in the right inguinal ring. Patient has had inguinal hernia repairs bilaterally. Small benign right adrenal myelolipoma. Spinal stenosis, moderately severe at L4-5, multifactorial. Aortoiliac atherosclerosis.  Coronary artery calcification noted. Electronically Signed   By: Bretta Bang III M.D.   On: 10/19/2016 17:02   Dg Chest Port 1 View  Result Date: 10/19/2016 CLINICAL DATA:  Vomiting and sepsis EXAM: PORTABLE CHEST 1 VIEW COMPARISON:  Chest radiograph 12/19/2005 FINDINGS: Shallow lung inflation without focal consolidation or pulmonary edema. No pneumothorax or pleural effusion. Calcific aortic atherosclerosis but otherwise normal cardiomediastinal silhouette. IMPRESSION: Shallow lung inflation without evidence of pneumonia. Electronically Signed   By: Deatra Robinson M.D.   On: 10/19/2016 21:27   Dg C-arm 1-60 Min-no Report  Result Date: 10/19/2016 Fluoroscopy was utilized by the requesting physician.  No radiographic interpretation.     Subjective: Feeling well, tolerating diet   Discharge  Exam: Vitals:   10/25/16 2103 10/26/16 0556  BP: (!) 176/95 (!) 169/91  Pulse: 85 77  Resp: 20 19  Temp: 97.2 F (36.2 C) 97.3 F (36.3 C)   Vitals:   10/25/16 1042 10/25/16 1422 10/25/16 2103 10/26/16 0556  BP: 132/89 (!) 156/83 (!) 176/95 (!) 169/91  Pulse: 80 93 85 77  Resp:  (!) 23 20 19   Temp:  98.4 F (36.9 C) 97.2 F (36.2 C) 97.3 F (36.3 C)  TempSrc:  Oral Oral Oral  SpO2:  99% 95% 98%  Weight:      Height:        General: Pt is alert, awake, not in acute distress Cardiovascular: RRR, S1/S2 +, no rubs, no gallops Respiratory: CTA bilaterally, no wheezing, no rhonchi Abdominal: Soft, NT, ND, bowel sounds + Extremities: no edema, no cyanosis    The results of significant diagnostics from this hospitalization (including imaging, microbiology, ancillary and laboratory) are listed below for reference.     Microbiology: Recent Results (from the past 240 hour(s))  Culture, blood (x 2)     Status: Abnormal   Collection Time: 10/19/16  8:15 PM  Result Value Ref Range Status   Specimen Description BLOOD RIGHT ANTECUBITAL  Final   Special Requests IN PEDIATRIC BOTTLE Blood Culture adequate volume  Final   Culture  Setup Time   Final    GRAM POSITIVE COCCI IN PEDIATRIC BOTTLE CRITICAL RESULT CALLED TO, READ BACK BY AND VERIFIED WITH: Peggyann Juba PHARMD, AT 9528 10/21/16 BY Renato Shin Performed at [facility], [address], [place], [place] [address]    Culture STAPHYLOCOCCUS EPIDERMIDIS (A)  Final   Report Status 10/23/2016 FINAL  Final   Organism ID, Bacteria STAPHYLOCOCCUS EPIDERMIDIS  Final      Susceptibility   Staphylococcus epidermidis - MIC*    CIPROFLOXACIN >=8 RESISTANT Resistant     ERYTHROMYCIN <=0.25 SENSITIVE Sensitive     GENTAMICIN <=0.5 SENSITIVE Sensitive     OXACILLIN >=4 RESISTANT Resistant     TETRACYCLINE 2 SENSITIVE Sensitive     VANCOMYCIN 1 SENSITIVE Sensitive     TRIMETH/SULFA 40 SENSITIVE Sensitive     CLINDAMYCIN <=0.25  SENSITIVE Sensitive     RIFAMPIN <=0.5 SENSITIVE Sensitive     Inducible Clindamycin NEGATIVE Sensitive     * STAPHYLOCOCCUS EPIDERMIDIS  Blood Culture ID Panel (Reflexed)  Status: Abnormal   Collection Time: 10/19/16  8:15 PM  Result Value Ref Range Status   Enterococcus species NOT DETECTED NOT DETECTED Final   Listeria monocytogenes NOT DETECTED NOT DETECTED Final   Staphylococcus species DETECTED (A) NOT DETECTED Final    Comment: Methicillin (oxacillin) resistant coagulase negative staphylococcus. Possible blood culture contaminant (unless isolated from more than one blood culture draw or clinical case suggests pathogenicity). No antibiotic treatment is indicated for blood  culture contaminants. CRITICAL RESULT CALLED TO, READ BACK BY AND VERIFIED WITH: JIllene Bolus PHARMD, AT 6962 10/21/16 BY D. VANHOOK    Staphylococcus aureus NOT DETECTED NOT DETECTED Final   Methicillin resistance DETECTED (A) NOT DETECTED Final    Comment: CRITICAL RESULT CALLED TO, READ BACK BY AND VERIFIED WITH: J. GRIMSLEY PHARMD, AT 9528 10/21/16 BY D. VANHOOK    Streptococcus species NOT DETECTED NOT DETECTED Final   Streptococcus agalactiae NOT DETECTED NOT DETECTED Final   Streptococcus pneumoniae NOT DETECTED NOT DETECTED Final   Streptococcus pyogenes NOT DETECTED NOT DETECTED Final   Acinetobacter baumannii NOT DETECTED NOT DETECTED Final   Enterobacteriaceae species NOT DETECTED NOT DETECTED Final   Enterobacter cloacae complex NOT DETECTED NOT DETECTED Final   Escherichia coli NOT DETECTED NOT DETECTED Final   Klebsiella oxytoca NOT DETECTED NOT DETECTED Final   Klebsiella pneumoniae NOT DETECTED NOT DETECTED Final   Proteus species NOT DETECTED NOT DETECTED Final   Serratia marcescens NOT DETECTED NOT DETECTED Final   Haemophilus influenzae NOT DETECTED NOT DETECTED Final   Neisseria meningitidis NOT DETECTED NOT DETECTED Final   Pseudomonas aeruginosa NOT DETECTED NOT DETECTED Final    Candida albicans NOT DETECTED NOT DETECTED Final   Candida glabrata NOT DETECTED NOT DETECTED Final   Candida krusei NOT DETECTED NOT DETECTED Final   Candida parapsilosis NOT DETECTED NOT DETECTED Final   Candida tropicalis NOT DETECTED NOT DETECTED Final    Comment: Performed at [facility], [address] [place], [place] [address]  Culture, blood (Routine X 2) w Reflex to ID Panel     Status: Abnormal   Collection Time: 10/19/16  8:57 PM  Result Value Ref Range Status   Specimen Description BLOOD BLOOD RIGHT HAND  Final   Special Requests   Final    IN PEDIATRIC BOTTLE Blood Culture results may not be optimal due to an inadequate volume of blood received in culture bottles   Culture  Setup Time   Final    PED GRAM POSITIVE COCCI IN CLUSTERS CRITICAL RESULT CALLED TO, READ BACK BY AND VERIFIED WITH: J. GRIMSLEY PHARMD, AT 4010 10/21/16 BY D. VANHOOK    Culture (A)  Final    STAPHYLOCOCCUS EPIDERMIDIS SUSCEPTIBILITIES PERFORMED ON PREVIOUS CULTURE WITHIN THE LAST 5 DAYS. Performed at [facility], [address], [place], [place] [address]    Report Status 10/23/2016 FINAL  Final  Surgical pcr screen     Status: None   Collection Time: 10/19/16 10:01 PM  Result Value Ref Range Status   MRSA, PCR NEGATIVE NEGATIVE Final   Staphylococcus aureus NEGATIVE NEGATIVE Final    Comment:        The Xpert SA Assay (FDA approved for NASAL specimens in patients over 53 years of age), is one component of a comprehensive surveillance program.  Test performance has been validated by [facility] for patients greater than or equal to 75 year old. It is not intended to diagnose infection nor to guide or monitor treatment.  Urine culture     Status: Abnormal   Collection Time: 10/19/16 11:09 PM  Result Value Ref Range Status   Specimen Description URINE, RANDOM CYSTOSCOPY  Final   Special Requests NONE  Final   Culture >=100,000 COLONIES/mL STAPHYLOCOCCUS EPIDERMIDIS (A)   Final   Report Status 10/22/2016 FINAL  Final   Organism ID, Bacteria STAPHYLOCOCCUS EPIDERMIDIS (A)  Final      Susceptibility   Staphylococcus epidermidis - MIC*    CIPROFLOXACIN >=8 RESISTANT Resistant     GENTAMICIN <=0.5 SENSITIVE Sensitive     NITROFURANTOIN <=16 SENSITIVE Sensitive     OXACILLIN >=4 RESISTANT Resistant     TETRACYCLINE 2 SENSITIVE Sensitive     VANCOMYCIN 1 SENSITIVE Sensitive     TRIMETH/SULFA 20 SENSITIVE Sensitive     CLINDAMYCIN <=0.25 SENSITIVE Sensitive     RIFAMPIN <=0.5 SENSITIVE Sensitive     Inducible Clindamycin NEGATIVE Sensitive     * >=100,000 COLONIES/mL STAPHYLOCOCCUS EPIDERMIDIS  Culture, blood (routine x 2)     Status: None (Preliminary result)   Collection Time: 10/22/16  9:00 AM  Result Value Ref Range Status   Specimen Description BLOOD RIGHT HAND  Final   Special Requests   Final    BOTTLES DRAWN AEROBIC AND ANAEROBIC Blood Culture adequate volume   Culture   Final    NO GROWTH 4 DAYS Performed at [facility], [address] [place], [place] [address]    Report Status PENDING  Incomplete  Culture, blood (routine x 2)     Status: None (Preliminary result)   Collection Time: 10/22/16  9:07 AM  Result Value Ref Range Status   Specimen Description BLOOD RIGHT HAND  Final   Special Requests   Final    BOTTLES DRAWN AEROBIC AND ANAEROBIC Blood Culture adequate volume   Culture   Final    NO GROWTH 4 DAYS Performed at [facility], [address], [place], [place] [address]    Report Status PENDING  Incomplete     Labs: BNP (last 3 results) No results for input(s): BNP in the last 8760 hours. Basic Metabolic Panel:  Recent Labs Lab 10/21/16 0539 10/22/16 0603 10/24/16 0700 10/25/16 0515 10/25/16 1017 10/26/16 0435  NA 138 137 139  --  138 139  K 3.6 3.7 4.2  --  4.2 4.1  CL 106 102 98*  --  103 106  CO2 25 25 32  --  28 27  GLUCOSE 164* 236* 206*  --  268* 192*  BUN 22* 14 22*  --  15 14  CREATININE 1.11  1.01 1.52* 1.30* 1.08 1.12  CALCIUM 7.9* 8.4* 8.9  --  8.2* 8.5*   Liver Function Tests:  Recent Labs Lab 10/19/16 1420  AST 25  ALT 17  ALKPHOS 52  BILITOT 0.9  PROT 8.1  ALBUMIN 4.3    Recent Labs Lab 10/19/16 1420  LIPASE 30   No results for input(s): AMMONIA in the last 168 hours. CBC:  Recent Labs Lab 10/19/16 1420 10/20/16 0612 10/21/16 0539 10/22/16 0603  WBC 29.9* 17.8* 10.1 9.0  HGB 14.3 11.9* 11.7* 12.0*  HCT 41.4 35.0* 35.1* 37.6*  MCV 86.4 87.3 88.4 88.3  PLT 277 198 182 207   Cardiac Enzymes: No results for input(s): CKTOTAL, CKMB, CKMBINDEX, TROPONINI in the last 168 hours. BNP: Invalid input(s): POCBNP CBG:  Recent Labs Lab 10/25/16 0802 10/25/16 1221 10/25/16 1709 10/25/16 2102 10/26/16 0727  GLUCAP 237* 197* 185* 232*  199*   D-Dimer No results for input(s): DDIMER in the last 72 hours. Hgb A1c No results for input(s): HGBA1C in the last 72 hours. Lipid Profile No results for input(s): CHOL, HDL, LDLCALC, TRIG, CHOLHDL, LDLDIRECT in the last 72 hours. Thyroid function studies No results for input(s): TSH, T4TOTAL, T3FREE, THYROIDAB in the last 72 hours.  Invalid input(s): FREET3 Anemia work up No results for input(s): VITAMINB12, FOLATE, FERRITIN, TIBC, IRON, RETICCTPCT in the last 72 hours. Urinalysis    Component Value Date/Time   COLORURINE YELLOW 10/19/2016 1729   APPEARANCEUR CLOUDY (A) 10/19/2016 1729   LABSPEC 1.021 10/19/2016 1729   PHURINE 7.0 10/19/2016 1729   GLUCOSEU 150 (A) 10/19/2016 1729   HGBUR SMALL (A) 10/19/2016 1729   BILIRUBINUR NEGATIVE 10/19/2016 1729   KETONESUR 5 (A) 10/19/2016 1729   PROTEINUR 100 (A) 10/19/2016 1729   NITRITE NEGATIVE 10/19/2016 1729   LEUKOCYTESUR MODERATE (A) 10/19/2016 1729   Sepsis Labs Invalid input(s): PROCALCITONIN,  WBC,  LACTICIDVEN Microbiology Recent Results (from the past 240 hour(s))  Culture, blood (x 2)     Status: Abnormal   Collection Time: 10/19/16  8:15 PM   Result Value Ref Range Status   Specimen Description BLOOD RIGHT ANTECUBITAL  Final   Special Requests IN PEDIATRIC BOTTLE Blood Culture adequate volume  Final   Culture  Setup Time   Final    GRAM POSITIVE COCCI IN PEDIATRIC BOTTLE CRITICAL RESULT CALLED TO, READ BACK BY AND VERIFIED WITH: Peggyann Juba PHARMD, AT 4098 10/21/16 BY Renato Shin Performed at [facility], [address] [place], [place] [address]    Culture STAPHYLOCOCCUS EPIDERMIDIS (A)  Final   Report Status 10/23/2016 FINAL  Final   Organism ID, Bacteria STAPHYLOCOCCUS EPIDERMIDIS  Final      Susceptibility   Staphylococcus epidermidis - MIC*    CIPROFLOXACIN >=8 RESISTANT Resistant     ERYTHROMYCIN <=0.25 SENSITIVE Sensitive     GENTAMICIN <=0.5 SENSITIVE Sensitive     OXACILLIN >=4 RESISTANT Resistant     TETRACYCLINE 2 SENSITIVE Sensitive     VANCOMYCIN 1 SENSITIVE Sensitive     TRIMETH/SULFA 40 SENSITIVE Sensitive     CLINDAMYCIN <=0.25 SENSITIVE Sensitive     RIFAMPIN <=0.5 SENSITIVE Sensitive     Inducible Clindamycin NEGATIVE Sensitive     * STAPHYLOCOCCUS EPIDERMIDIS  Blood Culture ID Panel (Reflexed)     Status: Abnormal   Collection Time: 10/19/16  8:15 PM  Result Value Ref Range Status   Enterococcus species NOT DETECTED NOT DETECTED Final   Listeria monocytogenes NOT DETECTED NOT DETECTED Final   Staphylococcus species DETECTED (A) NOT DETECTED Final    Comment: Methicillin (oxacillin) resistant coagulase negative staphylococcus. Possible blood culture contaminant (unless isolated from more than one blood culture draw or clinical case suggests pathogenicity). No antibiotic treatment is indicated for blood  culture contaminants. CRITICAL RESULT CALLED TO, READ BACK BY AND VERIFIED WITH: JIllene Bolus PHARMD, AT 7829 10/21/16 BY D. VANHOOK    Staphylococcus aureus NOT DETECTED NOT DETECTED Final   Methicillin resistance DETECTED (A) NOT DETECTED Final    Comment: CRITICAL RESULT CALLED TO, READ BACK  BY AND VERIFIED WITH: J. GRIMSLEY PHARMD, AT 5621 10/21/16 BY D. VANHOOK    Streptococcus species NOT DETECTED NOT DETECTED Final   Streptococcus agalactiae NOT DETECTED NOT DETECTED Final   Streptococcus pneumoniae NOT DETECTED NOT DETECTED Final   Streptococcus pyogenes NOT DETECTED NOT DETECTED Final   Acinetobacter baumannii NOT DETECTED NOT DETECTED Final  Enterobacteriaceae species NOT DETECTED NOT DETECTED Final   Enterobacter cloacae complex NOT DETECTED NOT DETECTED Final   Escherichia coli NOT DETECTED NOT DETECTED Final   Klebsiella oxytoca NOT DETECTED NOT DETECTED Final   Klebsiella pneumoniae NOT DETECTED NOT DETECTED Final   Proteus species NOT DETECTED NOT DETECTED Final   Serratia marcescens NOT DETECTED NOT DETECTED Final   Haemophilus influenzae NOT DETECTED NOT DETECTED Final   Neisseria meningitidis NOT DETECTED NOT DETECTED Final   Pseudomonas aeruginosa NOT DETECTED NOT DETECTED Final   Candida albicans NOT DETECTED NOT DETECTED Final   Candida glabrata NOT DETECTED NOT DETECTED Final   Candida krusei NOT DETECTED NOT DETECTED Final   Candida parapsilosis NOT DETECTED NOT DETECTED Final   Candida tropicalis NOT DETECTED NOT DETECTED Final    Comment: Performed at [facility], [address], [place], [place] [address]  Culture, blood (Routine X 2) w Reflex to ID Panel     Status: Abnormal   Collection Time: 10/19/16  8:57 PM  Result Value Ref Range Status   Specimen Description BLOOD BLOOD RIGHT HAND  Final   Special Requests   Final    IN PEDIATRIC BOTTLE Blood Culture results may not be optimal due to an inadequate volume of blood received in culture bottles   Culture  Setup Time   Final    PED GRAM POSITIVE COCCI IN CLUSTERS CRITICAL RESULT CALLED TO, READ BACK BY AND VERIFIED WITH: J. GRIMSLEY PHARMD, AT 9147 10/21/16 BY D. VANHOOK    Culture (A)  Final    STAPHYLOCOCCUS EPIDERMIDIS SUSCEPTIBILITIES PERFORMED ON PREVIOUS CULTURE WITHIN THE LAST 5  DAYS. Performed at [facility], [address] [place], [place] [address]    Report Status 10/23/2016 FINAL  Final  Surgical pcr screen     Status: None   Collection Time: 10/19/16 10:01 PM  Result Value Ref Range Status   MRSA, PCR NEGATIVE NEGATIVE Final   Staphylococcus aureus NEGATIVE NEGATIVE Final    Comment:        The Xpert SA Assay (FDA approved for NASAL specimens in patients over 34 years of age), is one component of a comprehensive surveillance program.  Test performance has been validated by [facility] for patients greater than or equal to 3 year old. It is not intended to diagnose infection nor to guide or monitor treatment.   Urine culture     Status: Abnormal   Collection Time: 10/19/16 11:09 PM  Result Value Ref Range Status   Specimen Description URINE, RANDOM CYSTOSCOPY  Final   Special Requests NONE  Final   Culture >=100,000 COLONIES/mL STAPHYLOCOCCUS EPIDERMIDIS (A)  Final   Report Status 10/22/2016 FINAL  Final   Organism ID, Bacteria STAPHYLOCOCCUS EPIDERMIDIS (A)  Final      Susceptibility   Staphylococcus epidermidis - MIC*    CIPROFLOXACIN >=8 RESISTANT Resistant     GENTAMICIN <=0.5 SENSITIVE Sensitive     NITROFURANTOIN <=16 SENSITIVE Sensitive     OXACILLIN >=4 RESISTANT Resistant     TETRACYCLINE 2 SENSITIVE Sensitive     VANCOMYCIN 1 SENSITIVE Sensitive     TRIMETH/SULFA 20 SENSITIVE Sensitive     CLINDAMYCIN <=0.25 SENSITIVE Sensitive     RIFAMPIN <=0.5 SENSITIVE Sensitive     Inducible Clindamycin NEGATIVE Sensitive     * >=100,000 COLONIES/mL STAPHYLOCOCCUS EPIDERMIDIS  Culture, blood (routine x 2)     Status: None (Preliminary result)   Collection Time: 10/22/16  9:00 AM  Result  Value Ref Range Status   Specimen Description BLOOD RIGHT HAND  Final   Special Requests   Final    BOTTLES DRAWN AEROBIC AND ANAEROBIC Blood Culture adequate volume   Culture   Final    NO GROWTH 4 DAYS Performed at [facility], [address] [place], [place] [address]    Report Status PENDING  Incomplete  Culture, blood (routine x 2)     Status: None (Preliminary result)   Collection Time: 10/22/16  9:07 AM  Result Value Ref Range Status   Specimen Description BLOOD RIGHT HAND  Final   Special Requests   Final    BOTTLES DRAWN AEROBIC AND ANAEROBIC Blood Culture adequate volume   Culture   Final    NO GROWTH 4 DAYS Performed at [facility], [address], [place], [place] [address]    Report Status PENDING  Incomplete     Time coordinating discharge: Over 30 minutes  SIGNED:   Alba Cory, MD  [facility] 10/26/2016, 11:01 AM Pager [phone]  If 7PM-7AM, please contact night-coverage [email or web address] Password [email or web address]

## 2016-10-26 NOTE — Progress Notes (Signed)
[id number]/LYHTMB Davis,BSN,RN3,CCM:[phone]/Pam chandler with [facility] alerted to home iv needs.

## 2016-10-26 NOTE — Clinical Social Work Placement (Addendum)
Authorization received.  [facility] called for transport.   CLINICAL SOCIAL WORK PLACEMENT  NOTE  Date:  10/26/2016  Patient Details  Name: Wesley Ingram MRN: [id number] Date of Birth: 1945-07-13  Clinical Social Work is seeking post-discharge placement for this patient at the [place] level of care (*CSW will initial, date and re-position this form in  chart as items are completed):  Yes   Patient/family provided with [place] Work Department's list of facilities offering this level of care within the geographic area requested by the patient (or if unable, by the patient's family).  Yes   Patient/family informed of their freedom to choose among providers that offer the needed level of care, that participate in Medicare, Medicaid or managed care program needed by the patient, have an available bed and are willing to accept the patient.  Yes   Patient/family informed of [facility]'s ownership interest in [facility] and [facility], as well as of the fact that they are under no obligation to receive care at these facilities.  PASRR submitted to EDS on       PASRR number received on       Existing PASRR number confirmed on 10/25/16     FL2 transmitted to all facilities in geographic area requested by pt/family on       FL2 transmitted to all facilities within larger geographic area on       Patient informed that his/her managed care company has contracts with or will negotiate with certain facilities, including the following:  [facility]         Patient/family informed of bed offers received.  Patient chooses bed at [facility]     Physician recommends and patient chooses bed at      Patient to be transferred to [facility] on 10/26/16.  Patient to be transferred to facility by [facility]     Patient family notified on 10/26/16 of transfer.  Name of family member notified:    Spouse    PHYSICIAN Please sign DNR      Additional Comment:    _______________________________________________ Lia Hopping, [place] 10/26/2016, 11:28 AM

## 2016-10-26 NOTE — Progress Notes (Signed)
Report called to [facility] at [facility].

## 2016-10-27 ENCOUNTER — Other Ambulatory Visit: Payer: Self-pay | Admitting: Urology

## 2016-10-27 LAB — CULTURE, BLOOD (ROUTINE X 2)
Culture: NO GROWTH
Culture: NO GROWTH
SPECIAL REQUESTS: ADEQUATE
SPECIAL REQUESTS: ADEQUATE

## 2016-11-15 NOTE — Anesthesia Postprocedure Evaluation (Signed)
Anesthesia Post Note  Patient: Wesley Ingram  Procedure(s) Performed: Procedure(s) (LRB): CYSTOSCOPY WITH STENT PLACEMENT (Right)     Anesthesia Post Evaluation  Last Vitals:  Vitals:   10/25/16 2103 10/26/16 0556  BP: (!) 176/95 (!) 169/91  Pulse: 85 77  Resp: 20 19  Temp: 36.2 C 36.3 C    Last Pain:  Vitals:   10/26/16 0800  TempSrc:   PainSc: 0-No pain                 Jacqulynn Shappell S

## 2016-11-15 NOTE — Progress Notes (Addendum)
10-19-16 (EPIC) EKG, CXR, HGA1C 6.6,  10-23-16 (EPIC) ECHO 07-27-16 Office visit on chart from [place]

## 2016-11-15 NOTE — Patient Instructions (Addendum)
Wesley Ingram  11/15/2016   Your procedure is scheduled on: 11-19-16  Report to [place] Entrance Take [place] Elevators to 3rd floor to [place] at 7:30 AM.   Call this number if you have problems the morning of surgery [phone]    Remember: ONLY 1 PERSON MAY GO WITH YOU TO SHORT STAY TO GET  READY MORNING OF [place].  Do not eat food or drink liquids :After Midnight.     Take these medicines the morning of surgery with A SIP OF WATER: Amlodipine (Norvasc), Gabapentin (Neurotin), and Metoprolol  DO NOT TAKE ANY DIABETIC MEDICATIONS DAY OF YOUR SURGERY                               You may not have any metal on your body including hair pins and              piercings  Do not wear jewelry, make-up, lotions, powders or perfumes, deodorant             Men may shave face and neck.   Do not bring valuables to the hospital. [place].  Contacts, dentures or bridgework may not be worn into surgery.     Patients discharged the day of surgery will not be allowed to drive home.  Name and phone number of your driver: Jerson Furukawa [phone]                Please read over the following fact sheets you were given: _____________________________________________________________________  How to Manage Your Diabetes Before and After Surgery  Why is it important to control my blood sugar before and after surgery? . Improving blood sugar levels before and after surgery helps healing and can limit problems. . A way of improving blood sugar control is eating a healthy diet by: o  Eating less sugar and carbohydrates o  Increasing activity/exercise o  Talking with your doctor about reaching your blood sugar goals . High blood sugars (greater than 180 mg/dL) can raise your risk of infections and slow your recovery, so you will need to focus on controlling your diabetes during the weeks before  surgery. . Make sure that the doctor who takes care of your diabetes knows about your planned surgery including the date and location.  How do I manage my blood sugar before surgery? . Check your blood sugar at least 4 times a day, starting 2 days before surgery, to make sure that the level is not too high or low. o Check your blood sugar the morning of your surgery when you wake up and every 2 hours until you get to the Short Stay unit. . If your blood sugar is less than 70 mg/dL, you will need to treat for low blood sugar: o Do not take insulin. o Treat a low blood sugar (less than 70 mg/dL) with  cup of clear juice (cranberry or apple), 4 glucose tablets, OR glucose gel. o Recheck blood sugar in 15 minutes after treatment (to make sure it is greater than 70 mg/dL). If your blood sugar is not greater than 70 mg/dL on recheck, call [phone] for further instructions. . Report your blood sugar to the short stay nurse when you get  to Short Stay.  . If you are admitted to the hospital after surgery: o Your blood sugar will be checked by the staff and you will probably be given insulin after surgery (instead of oral diabetes medicines) to make sure you have good blood sugar levels. o The goal for blood sugar control after surgery is 80-180 mg/dL.   WHAT DO I DO ABOUT MY DIABETES MEDICATION?  Marland Kitchen Do not take oral diabetes medicines (pills) the morning of surgery.        The day before surgery, only take your morning dose of Glizipide. Take your usual dose of Metformin .  Marland Kitchen The day of surgery, do not take other diabetes injectables, including Byetta (exenatide), Bydureon (exenatide ER), Victoza (liraglutide), or Trulicity (dulaglutide).   .  Patient Signature:  Date:   Nurse Signature:  Date:   Reviewed and Endorsed by [facility] Patient Education Committee, August 2015          [facility] - Preparing for Surgery Before surgery, you can play an important role.  Because skin is not  sterile, your skin needs to be as free of germs as possible.  You can reduce the number of germs on your skin by washing with CHG (chlorahexidine gluconate) soap before surgery.  CHG is an antiseptic cleaner which kills germs and bonds with the skin to continue killing germs even after washing. Please DO NOT use if you have an allergy to CHG or antibacterial soaps.  If your skin becomes reddened/irritated stop using the CHG and inform your nurse when you arrive at Short Stay. Do not shave (including legs and underarms) for at least 48 hours prior to the first CHG shower.  You may shave your face/neck. Please follow these instructions carefully:  1.  Shower with CHG Soap the night before surgery and the  morning of Surgery.  2.  If you choose to wash your hair, wash your hair first as usual with your  normal  shampoo.  3.  After you shampoo, rinse your hair and body thoroughly to remove the  shampoo.                           4.  Use CHG as you would any other liquid soap.  You can apply chg directly  to the skin and wash                       Gently with a scrungie or clean washcloth.  5.  Apply the CHG Soap to your body ONLY FROM THE NECK DOWN.   Do not use on face/ open                           Wound or open sores. Avoid contact with eyes, ears mouth and genitals (private parts).                       Wash face,  Genitals (private parts) with your normal soap.             6.  Wash thoroughly, paying special attention to the area where your surgery  will be performed.  7.  Thoroughly rinse your body with warm water from the neck down.  8.  DO NOT shower/wash with your normal soap after using and rinsing off  the CHG Soap.  9.  Pat yourself dry with a clean towel.            10.  Wear clean pajamas.            11.  Place clean sheets on your bed the night of your first shower and do not  sleep with pets. Day of Surgery : Do not apply any lotions/deodorants the morning of surgery.   Please wear clean clothes to the hospital/surgery center.  FAILURE TO FOLLOW THESE INSTRUCTIONS MAY RESULT IN THE CANCELLATION OF YOUR SURGERY PATIENT SIGNATURE_________________________________  NURSE SIGNATURE__________________________________  ________________________________________________________________________

## 2016-11-15 NOTE — Addendum Note (Signed)
Addendum  created 11/15/16 1426 by Rhyatt Muska, MD   Sign clinical note    

## 2016-11-16 ENCOUNTER — Encounter (HOSPITAL_COMMUNITY)
Admission: RE | Admit: 2016-11-16 | Discharge: 2016-11-16 | Disposition: A | Discharge status: Home / Self Care | Payer: BLUE CROSS/BLUE SHIELD | Source: Ambulatory Visit | Attending: Urology | Admitting: Urology

## 2016-11-16 ENCOUNTER — Encounter (HOSPITAL_COMMUNITY): Payer: Self-pay

## 2016-11-16 DIAGNOSIS — Z79899 Other long term (current) drug therapy: Secondary | ICD-10-CM | POA: Diagnosis not present

## 2016-11-16 DIAGNOSIS — I10 Essential (primary) hypertension: Secondary | ICD-10-CM | POA: Diagnosis not present

## 2016-11-16 DIAGNOSIS — Z8673 Personal history of transient ischemic attack (TIA), and cerebral infarction without residual deficits: Secondary | ICD-10-CM | POA: Diagnosis not present

## 2016-11-16 DIAGNOSIS — E781 Pure hyperglyceridemia: Secondary | ICD-10-CM | POA: Diagnosis not present

## 2016-11-16 DIAGNOSIS — E119 Type 2 diabetes mellitus without complications: Secondary | ICD-10-CM | POA: Diagnosis not present

## 2016-11-16 DIAGNOSIS — N201 Calculus of ureter: Secondary | ICD-10-CM | POA: Diagnosis present

## 2016-11-16 LAB — CBC
HCT: 40.6 % (ref 39.0–52.0)
Hemoglobin: 13.5 g/dL (ref 13.0–17.0)
MCH: 29.3 pg (ref 26.0–34.0)
MCHC: 33.3 g/dL (ref 30.0–36.0)
MCV: 88.3 fL (ref 78.0–100.0)
PLATELETS: 281 10*3/uL (ref 150–400)
RBC: 4.6 MIL/uL (ref 4.22–5.81)
RDW: 13.4 % (ref 11.5–15.5)
WBC: 15.1 10*3/uL — AB (ref 4.0–10.5)

## 2016-11-16 LAB — BASIC METABOLIC PANEL
Anion gap: 11 (ref 5–15)
BUN: 32 mg/dL — ABNORMAL HIGH (ref 6–20)
CALCIUM: 9.8 mg/dL (ref 8.9–10.3)
CO2: 25 mmol/L (ref 22–32)
CREATININE: 1.42 mg/dL — AB (ref 0.61–1.24)
Chloride: 102 mmol/L (ref 101–111)
GFR, EST AFRICAN AMERICAN: 57 mL/min — AB (ref >60)
GFR, EST NON AFRICAN AMERICAN: 49 mL/min — AB (ref >60)
Glucose, Bld: 79 mg/dL (ref 65–99)
Potassium: 5.2 mmol/L — ABNORMAL HIGH (ref 3.5–5.1)
SODIUM: 138 mmol/L (ref 135–145)

## 2016-11-16 LAB — GLUCOSE, CAPILLARY: GLUCOSE-CAPILLARY: 73 mg/dL (ref 65–99)

## 2016-11-16 NOTE — Progress Notes (Signed)
EKG results reviewed with Dr. Linna Caprice, Anesthesia. No new orders at this time. 11-16-16 CBC and CMP results routed to Dr. Zettie Pho office for review.

## 2016-11-19 ENCOUNTER — Ambulatory Visit (HOSPITAL_COMMUNITY)
Admission: RE | Admit: 2016-11-19 | Discharge: 2016-11-19 | Disposition: A | Discharge status: Home / Self Care | Payer: BLUE CROSS/BLUE SHIELD | Source: Ambulatory Visit | Attending: Urology | Admitting: Urology

## 2016-11-19 ENCOUNTER — Ambulatory Visit (HOSPITAL_COMMUNITY): Payer: BLUE CROSS/BLUE SHIELD | Admitting: Certified Registered Nurse Anesthetist

## 2016-11-19 ENCOUNTER — Ambulatory Visit (HOSPITAL_COMMUNITY): Payer: BLUE CROSS/BLUE SHIELD

## 2016-11-19 ENCOUNTER — Encounter (HOSPITAL_COMMUNITY)
Admission: RE | Disposition: A | Discharge status: Home / Self Care | Payer: Self-pay | Source: Ambulatory Visit | Attending: Urology

## 2016-11-19 ENCOUNTER — Encounter (HOSPITAL_COMMUNITY): Payer: Self-pay | Admitting: *Deleted

## 2016-11-19 DIAGNOSIS — N201 Calculus of ureter: Secondary | ICD-10-CM | POA: Diagnosis not present

## 2016-11-19 DIAGNOSIS — Z8673 Personal history of transient ischemic attack (TIA), and cerebral infarction without residual deficits: Secondary | ICD-10-CM | POA: Insufficient documentation

## 2016-11-19 DIAGNOSIS — E119 Type 2 diabetes mellitus without complications: Secondary | ICD-10-CM | POA: Insufficient documentation

## 2016-11-19 DIAGNOSIS — Z79899 Other long term (current) drug therapy: Secondary | ICD-10-CM | POA: Insufficient documentation

## 2016-11-19 DIAGNOSIS — I10 Essential (primary) hypertension: Secondary | ICD-10-CM | POA: Insufficient documentation

## 2016-11-19 DIAGNOSIS — E781 Pure hyperglyceridemia: Secondary | ICD-10-CM | POA: Insufficient documentation

## 2016-11-19 HISTORY — PX: HOLMIUM LASER APPLICATION: SHX5852

## 2016-11-19 HISTORY — PX: CYSTOSCOPY WITH RETROGRADE PYELOGRAM, URETEROSCOPY AND STENT PLACEMENT: SHX5789

## 2016-11-19 LAB — GLUCOSE, CAPILLARY
GLUCOSE-CAPILLARY: 140 mg/dL — AB (ref 65–99)
Glucose-Capillary: 182 mg/dL — ABNORMAL HIGH (ref 65–99)

## 2016-11-19 SURGERY — CYSTOURETEROSCOPY, WITH RETROGRADE PYELOGRAM AND STENT INSERTION
Anesthesia: General | Laterality: Right

## 2016-11-19 MED ORDER — FENTANYL CITRATE (PF) 100 MCG/2ML IJ SOLN
INTRAMUSCULAR | Status: AC
  Filled 2016-11-19: qty 2

## 2016-11-19 MED ORDER — FENTANYL CITRATE (PF) 100 MCG/2ML IJ SOLN: 25.0000 ug | INTRAMUSCULAR | Status: DC | PRN

## 2016-11-19 MED ORDER — MEPERIDINE HCL 50 MG/ML IJ SOLN: 6.2500 mg | INTRAMUSCULAR | Status: DC | PRN

## 2016-11-19 MED ORDER — EPHEDRINE SULFATE 50 MG/ML IJ SOLN
INTRAMUSCULAR | Status: DC | PRN
  Administered 2016-11-19 (×4): 5 mg via INTRAVENOUS

## 2016-11-19 MED ORDER — PROPOFOL 10 MG/ML IV BOLUS
INTRAVENOUS | Status: DC | PRN
  Administered 2016-11-19: 25 mg via INTRAVENOUS
  Administered 2016-11-19: 120 mg via INTRAVENOUS

## 2016-11-19 MED ORDER — SULFAMETHOXAZOLE-TRIMETHOPRIM 800-160 MG PO TABS: 1.0000 | ORAL_TABLET | Freq: Two times a day (BID) | ORAL | 0 refills | Status: DC

## 2016-11-19 MED ORDER — CIPROFLOXACIN IN D5W 400 MG/200ML IV SOLN
400.0000 mg | INTRAVENOUS | Status: AC
  Administered 2016-11-19: 400 mg via INTRAVENOUS
  Filled 2016-11-19: qty 200

## 2016-11-19 MED ORDER — LIDOCAINE HCL (CARDIAC) 20 MG/ML IV SOLN
INTRAVENOUS | Status: DC | PRN
  Administered 2016-11-19: 60 mg via INTRAVENOUS

## 2016-11-19 MED ORDER — LIDOCAINE 2% (20 MG/ML) 5 ML SYRINGE
INTRAMUSCULAR | Status: AC
  Filled 2016-11-19: qty 5

## 2016-11-19 MED ORDER — CLINDAMYCIN PHOSPHATE 900 MG/50ML IV SOLN
900.0000 mg | INTRAVENOUS | Status: AC
  Administered 2016-11-19: 900 mg via INTRAVENOUS
  Filled 2016-11-19: qty 50

## 2016-11-19 MED ORDER — TRAMADOL HCL 50 MG PO TABS: 50.0000 mg | ORAL_TABLET | Freq: Four times a day (QID) | ORAL | 0 refills | Status: AC | PRN

## 2016-11-19 MED ORDER — ONDANSETRON HCL 4 MG/2ML IJ SOLN
INTRAMUSCULAR | Status: AC
  Filled 2016-11-19: qty 2

## 2016-11-19 MED ORDER — SODIUM CHLORIDE 0.9 % IR SOLN
Status: DC | PRN
  Administered 2016-11-19: 6000 mL

## 2016-11-19 MED ORDER — EPHEDRINE 5 MG/ML INJ
INTRAVENOUS | Status: AC
  Filled 2016-11-19: qty 10

## 2016-11-19 MED ORDER — FENTANYL CITRATE (PF) 100 MCG/2ML IJ SOLN
INTRAMUSCULAR | Status: DC | PRN
  Administered 2016-11-19 (×2): 50 ug via INTRAVENOUS

## 2016-11-19 MED ORDER — PHENYLEPHRINE HCL 10 MG/ML IJ SOLN
INTRAMUSCULAR | Status: DC | PRN
  Administered 2016-11-19 (×4): 40 ug via INTRAVENOUS
  Administered 2016-11-19: 80 ug via INTRAVENOUS
  Administered 2016-11-19: 40 ug via INTRAVENOUS
  Administered 2016-11-19: 80 ug via INTRAVENOUS
  Administered 2016-11-19 (×2): 40 ug via INTRAVENOUS

## 2016-11-19 MED ORDER — PROPOFOL 10 MG/ML IV BOLUS
INTRAVENOUS | Status: AC
  Filled 2016-11-19: qty 20

## 2016-11-19 MED ORDER — LACTATED RINGERS IV SOLN
INTRAVENOUS | Status: DC
  Administered 2016-11-19: 08:00:00 via INTRAVENOUS

## 2016-11-19 MED ORDER — SODIUM CHLORIDE 0.9 % IV SOLN
INTRAVENOUS | Status: DC | PRN
  Administered 2016-11-19: 10:00:00 via INTRAVENOUS

## 2016-11-19 MED ORDER — KETOROLAC TROMETHAMINE 10 MG PO TABS
10.0000 mg | ORAL_TABLET | Freq: Four times a day (QID) | ORAL | 1 refills | Status: DC | PRN
Start: 2016-11-19 — End: 2019-01-16

## 2016-11-19 MED ORDER — MIDAZOLAM HCL 2 MG/2ML IJ SOLN
INTRAMUSCULAR | Status: AC
  Filled 2016-11-19: qty 2

## 2016-11-19 MED ORDER — 0.9 % SODIUM CHLORIDE (POUR BTL) OPTIME
TOPICAL | Status: DC | PRN
  Administered 2016-11-19: 1000 mL

## 2016-11-19 MED ORDER — ONDANSETRON HCL 4 MG/2ML IJ SOLN: 4.0000 mg | Freq: Once | INTRAMUSCULAR | Status: DC | PRN

## 2016-11-19 MED ORDER — ONDANSETRON HCL 4 MG/2ML IJ SOLN
INTRAMUSCULAR | Status: DC | PRN
  Administered 2016-11-19: 4 mg via INTRAVENOUS

## 2016-11-19 MED ORDER — IOHEXOL 300 MG/ML  SOLN
INTRAMUSCULAR | Status: DC | PRN
  Administered 2016-11-19: 26 mL

## 2016-11-19 MED ORDER — PHENYLEPHRINE 40 MCG/ML (10ML) SYRINGE FOR IV PUSH (FOR BLOOD PRESSURE SUPPORT)
PREFILLED_SYRINGE | INTRAVENOUS | Status: AC
  Filled 2016-11-19: qty 10

## 2016-11-19 SURGICAL SUPPLY — 27 items
BAG URO CATCHER STRL LF (MISCELLANEOUS) ×3 IMPLANT
BASKET LASER NITINOL 1.9FR (BASKET) IMPLANT
BSKT STON RTRVL 120 1.9FR (BASKET)
CATH INTERMIT  6FR 70CM (CATHETERS) ×3 IMPLANT
CATH URET DUAL LUMEN 6-10FR 50 (CATHETERS) ×2 IMPLANT
CLOTH BEACON ORANGE TIMEOUT ST (SAFETY) ×3 IMPLANT
COVER SURGICAL LIGHT HANDLE (MISCELLANEOUS) ×3 IMPLANT
FIBER LASER FLEXIVA 1000 (UROLOGICAL SUPPLIES) IMPLANT
FIBER LASER FLEXIVA 365 (UROLOGICAL SUPPLIES) IMPLANT
FIBER LASER FLEXIVA 550 (UROLOGICAL SUPPLIES) IMPLANT
FIBER LASER TRAC TIP (UROLOGICAL SUPPLIES) ×2 IMPLANT
GLOVE BIOGEL M STRL SZ7.5 (GLOVE) ×3 IMPLANT
GOWN STRL REUS W/TWL LRG LVL3 (GOWN DISPOSABLE) ×6 IMPLANT
GUIDEWIRE ANG ZIPWIRE 038X150 (WIRE) ×3 IMPLANT
GUIDEWIRE STR DUAL SENSOR (WIRE) ×3 IMPLANT
IV NS 1000ML (IV SOLUTION)
IV NS 1000ML BAXH (IV SOLUTION) ×1 IMPLANT
MANIFOLD NEPTUNE II (INSTRUMENTS) ×3 IMPLANT
PACK CYSTO (CUSTOM PROCEDURE TRAY) ×3 IMPLANT
SHEATH ACCESS URETERAL 24CM (SHEATH) IMPLANT
SHEATH ACCESS URETERAL 38CM (SHEATH) ×2 IMPLANT
SHEATH ACCESS URETERAL 54CM (SHEATH) IMPLANT
STENT CONTOUR 6FRX26X.038 (STENTS) ×2 IMPLANT
SYR CONTROL 10ML LL (SYRINGE) ×3 IMPLANT
TUBE FEEDING 8FR 16IN STR KANG (MISCELLANEOUS) ×3 IMPLANT
TUBING CONNECTING 10 (TUBING) ×2 IMPLANT
TUBING CONNECTING 10' (TUBING) ×1

## 2016-11-19 NOTE — Anesthesia Preprocedure Evaluation (Addendum)
Anesthesia Evaluation  Patient identified by MRN, date of birth, ID band Patient awake    Reviewed: Allergy & Precautions, NPO status , Patient's Chart, lab work & pertinent test results, reviewed documented beta blocker date and time   Airway Mallampati: II  TM Distance: >3 FB Neck ROM: Full    Dental  (+) Edentulous Upper, Poor Dentition   Pulmonary neg pulmonary ROS,    Pulmonary exam normal breath sounds clear to auscultation       Cardiovascular hypertension, Pt. on medications and Pt. on home beta blockers Normal cardiovascular exam Rhythm:Regular Rate:Normal     Neuro/Psych CVA, Residual Symptoms negative psych ROS   GI/Hepatic negative GI ROS, Neg liver ROS,   Endo/Other  diabetes  Renal/GU Renal InsufficiencyRenal disease  negative genitourinary   Musculoskeletal negative musculoskeletal ROS (+)   Abdominal Normal abdominal exam  (+)   Peds negative pediatric ROS (+)  Hematology negative hematology ROS (+) anticoagulated   Anesthesia Other Findings Wesley Ingram  ECHO COMPLETE WO IMAGE ENHANCING AGENT  Order# [id number]  Reading physician: Jacolyn Reedy, MD Ordering physician: Elmarie Shiley, MD Study date: 10/23/16 Study Result   Result status: Final result                             *[place] [employer].                        [place], [place] [address]                            [phone]  ------------------------------------------------------------------- Transthoracic Echocardiography  Patient:    Wesley Ingram, Wesley Ingram MR #:       [id number] Study Date: 10/23/2016 Gender:     M Age:        71 Height:     177.8 cm Weight:     74.8 kg BSA:        1.93 m^2 Pt. Status: Room:       1510   ADMITTING    Earlene Plater, [place] A  SONOGRAPHER  Johny Chess, RDCS, CCT  PERFORMING    [facility], Inpatient  ATTENDING    Mesner, Jason  cc:  ------------------------------------------------------------------- LV EF: 65% -   70%  -------------------------------------------------------------------    Reproductive/Obstetrics negative OB ROS                           Anesthesia Physical  Anesthesia Plan  ASA: II and emergent  Anesthesia Plan: General   Post-op Pain Management:    Induction: Intravenous  PONV Risk Score and Plan:   Airway Management Planned: Oral ETT and LMA  Additional Equipment:   Intra-op Plan:   Post-operative Plan:   Informed Consent: I have reviewed the patients History and Physical, chart, labs and discussed the procedure including the risks, benefits and alternatives for the proposed anesthesia with the patient or authorized representative who has indicated his/her understanding and acceptance.   Dental advisory given  Plan Discussed with: CRNA and Surgeon  Anesthesia Plan Comments:  Anesthesia Quick Evaluation  

## 2016-11-19 NOTE — H&P (Signed)
Wesley Ingram is an 71 y.o. male.    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 Has since cleared infectious parameters.  Today "Wesley Ingram" is seen to proceed with RIGHT ureteroscopic stone manipulation. No interval fevers. He competed vancomycin course in interval.   Past Medical History:  Diagnosis Date  . Diabetes mellitus   . High triglycerides   . Hypertension   . Renal disorder   . Stroke [facility]) 2008   left hemiparesis    Past Surgical History:  Procedure Laterality Date  . CYSTOSCOPY WITH STENT PLACEMENT Right 10/19/2016   Procedure: CYSTOSCOPY WITH STENT PLACEMENT;  Surgeon: Alexis Frock, MD;  Location: [facility];  Service: Urology;  Laterality: Right;  . HERNIA REPAIR     bilateral inguinal  . TOE AMPUTATION    . TONSILLECTOMY      Family History  Problem Relation Age of Onset  . Multiple myeloma Mother 6   Social History:  reports that he has never smoked. He has never used smokeless tobacco. He reports that he does not drink alcohol or use drugs.  Allergies: No Known Allergies  No prescriptions prior to admission.    No results found for this or any previous visit (from the past 48 hour(s)). No results found.  Review of Systems  Constitutional: Negative.  Negative for chills and fever.  HENT: Negative.   Respiratory: Negative.   Cardiovascular: Negative.   Gastrointestinal: Negative.   Genitourinary: Positive for urgency. Negative for flank pain.  Musculoskeletal: Negative.   Skin: Negative.   Neurological: Positive for sensory change and focal weakness.  Endo/Heme/Allergies: Negative.   Psychiatric/Behavioral: Negative.     There were no vitals taken for this visit. Physical Exam  Constitutional: He appears well-developed.  Stigmata of prior CVA with hemiparesis, stable.   HENT:  Head:  Normocephalic.  Eyes: Pupils are equal, round, and reactive to light.  Neck: Normal range of motion.  Cardiovascular: Normal rate.   Respiratory: Effort normal.  GI: Soft.  Genitourinary:  Genitourinary Comments: NO CVAT at present.  Musculoskeletal: Normal range of motion.  Neurological: He is alert.  Skin: Skin is warm.  Psychiatric: He has a normal mood and affect.     Assessment/Plan Proceed as planned with RIGHT ureteroscopic stone manipulation. Risks, benefits, alternatveis, expected peri-op course discussed previously and reiterated today.  Alexis Frock, MD 11/19/2016, 6:25 AM

## 2016-11-19 NOTE — Op Note (Signed)
NAME:  Wesley Ingram, Wesley Ingram NO.:  MEDICAL RECORD NO.:  [id number]  LOCATION:                                 FACILITY:  PHYSICIAN:  Sebastian Ache, MD     DATE OF BIRTH:  1945/11/15  DATE OF PROCEDURE: 11/19/2016                              OPERATIVE REPORT   PREOPERATIVE DIAGNOSES:  Recent history of large right ureteral stone and urosepsis.  PROCEDURES: 1. Cystoscopy with right retrograde pyelogram interpretation. 2. Right ureteroscopy laser lithotripsy. 3. Exchange of right ureteral stent, 6 x 26 Contour, no tether.  ESTIMATED BLOOD LOSS:  Nil.  COMPLICATION:  None.  SPECIMEN:  Right ureteral/renal stone fragments for compositional analysis.  FINDINGS: 1. Unremarkable urinary bladder. 2. Prior severe hydroureteronephrosis with tortuosity of the right     ureter and kidney. 3. Retrograde positioning of prior right proximal ureteral stone and     renal pelvis. 4. Complete resolution of all stone fragments larger than 1/3rd mm     following laser lithotripsy and basket extraction. 5. Successful replacement of right ureteral stent, proximal in renal     pelvis and distal in the urinary bladder.  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  He had an episode of urosepsis and right proximal ureteral stone last month, for which he was temporized with stenting and IV antibiotics.  He has cleared his infectious parameters, now presents for definitive stone management with ureteroscopy.  Informed consent was obtained and placed in the medical record.  PROCEDURE IN DETAIL:  The patient being, Wesley Ingram, was verified. Procedure being right ureteroscopic stone manipulation was confirmed. Procedure was carried out.  Time-out was performed.  Intravenous antibiotics were administered.  General anesthesia introduced.  The patient was placed into a low lithotomy position.  Sterile  field was created by prepping and draping the patient's penis, perineum, and proximal thighs using iodine.  Next, cystourethroscopy was performed using a rigid cystoscope with offset lens.  Inspection of bladder revealed no diverticula, calcifications, papillary lesions.  The distal end of right ureteral stent was seen in situ.  It was grasped, brought to the level of the urethral meatus.  A 0.038 ZIPwire was advanced and the stent was exchanged for an open-ended catheter and right retrograde pyelogram was obtained.  Right retrograde pyelogram demonstrated a single right ureter with single-system right kidney.  There was significant tortuosity of the ureter and severe hydronephrosis.  There were no obvious filling defects in the ureter.  This felt to likely represent retrograde positioning of prior proximal ureteral stone.  The ZIPwire was advanced to the level of the upper pole, set aside as a safety wire.  An 8-French feeding tube placed in the urinary bladder for pressure release.  A semi-rigid ureteroscopy was performed in the distal four-fifths of the right ureter alongside a separate Sensor working wire.  As expected, there was significant tortuosity of the ureter, but no obvious filling defects or calcifications.  The semi-rigid scope was exchanged for a 12/14, 38-cm ureteral access sheath at the level of proximal ureter using continuous fluoroscopic guidance.  An inspection of the proximal ureter  and right kidney was performed including all calices x3 flexible digital ureteroscope.  As expected, the prior right ureteral stone had been retrograde positioned into the level of renal pelvis.  It was grasped in Escape basket and repositioned again into an upper pole calyx and holmium laser energy applied to the stone using settings of 0.2 joules and 50 hertz in a dusting technique and approximately 70% of the stone volume was ablated as such.  Remaining 30% of the stone was broken  up into pieces approximately 1 mm or less in diameter.  These were then sequentially grasped in Escape basket and brought out in their entirety, set aside for compositional analysis.  Following these maneuvers, excellent hemostasis.  No evidence of renal perforation, there was complete resolution of all stone fragments larger than 1/3rd mm.  Given the large volume stone, it was clearly felt that interval stenting would be warranted without tether.  As such, the access sheath was removed under continuous vision.  No mucosal abnormalities were found, and a new 6 x 26 Contour-type stent was placed using cystoscopic and fluoroscopic guidance.  Good proximal and distal deployment were noted.  Bladder was emptied per cystoscope.  Foreskin was reduced and procedure terminated. The patient tolerated procedure well.  No immediate periprocedural complications.  The patient was taken to the postanesthesia care unit in stable condition.          ______________________________ Sebastian Ache, MD     TM/MEDQ  D:  11/19/2016  T:  11/19/2016  Job:  [id number]

## 2016-11-19 NOTE — Transfer of Care (Signed)
Immediate Anesthesia Transfer of Care Note  Patient: Michaelyn Barter  Procedure(s) Performed: Procedure(s): CYSTOSCOPY WITH RETROGRADE PYELOGRAM, URETEROSCOPY AND STENT EXCHANGE (Right) HOLMIUM LASER APPLICATION (Right)  Patient Location: PACU  Anesthesia Type:General  Level of Consciousness: awake, patient cooperative, lethargic and responds to stimulation  Airway & Oxygen Therapy: Patient Spontanous Breathing and Patient connected to face mask oxygen  Post-op Assessment: Report given to RN and Post -op Vital signs reviewed and stable  Post vital signs: Reviewed and stable  Last Vitals:  Vitals:   11/19/16 0735  BP: (!) 142/77  Pulse: 73  Resp: 16  Temp: 36.5 C    Last Pain:  Vitals:   11/19/16 0750  TempSrc:   PainSc: 0-No pain      Patients Stated Pain Goal: 3 (02/72/53 6644)  Complications: No apparent anesthesia complications

## 2016-11-19 NOTE — Anesthesia Postprocedure Evaluation (Signed)
Anesthesia Post Note  Patient: Wesley Ingram  Procedure(s) Performed: Procedure(s) (LRB): CYSTOSCOPY WITH RETROGRADE PYELOGRAM, URETEROSCOPY AND STENT EXCHANGE (Right) HOLMIUM LASER APPLICATION (Right)     Patient location during evaluation: PACU Anesthesia Type: General Level of consciousness: awake and sedated Pain management: pain level controlled Vital Signs Assessment: post-procedure vital signs reviewed and stable Respiratory status: spontaneous breathing Cardiovascular status: stable Postop Assessment: no signs of nausea or vomiting Anesthetic complications: no    Last Vitals:  Vitals:   11/19/16 1145 11/19/16 1156  BP: 140/82 (!) 154/86  Pulse: 88 86  Resp: 19 16  Temp: 36.3 C 36.4 C    Last Pain:  Vitals:   11/19/16 1145  TempSrc:   PainSc: 0-No pain   Pain Goal: Patients Stated Pain Goal: 3 (11/19/16 0750)               Jerimy Johanson JR,JOHN Mateo Flow

## 2016-11-19 NOTE — Discharge Instructions (Signed)
1 - You may have urinary urgency (bladder spasms) and bloody urine on / off with stent in place. This is normal. ° °2 - Call MD or go to ER for fever >102, severe pain / nausea / vomiting not relieved by medications, or acute change in medical status ° °

## 2016-11-19 NOTE — Brief Op Note (Signed)
11/19/2016  11:01 AM  PATIENT:  Wesley Ingram  71 y.o. male  PRE-OPERATIVE DIAGNOSIS:  RIGHT URETERAL STONE  POST-OPERATIVE DIAGNOSIS:  RIGHT URETERAL STONE  PROCEDURE:  Procedure(s): CYSTOSCOPY WITH RETROGRADE PYELOGRAM, URETEROSCOPY AND STENT EXCHANGE (Right) HOLMIUM LASER APPLICATION (Right)  SURGEON:  Surgeon(s) and Role:    * Alexis Frock, MD - Primary  PHYSICIAN ASSISTANT:   ASSISTANTS: none   ANESTHESIA:   general  EBL:  Total I/O In: 1000 [I.V.:1000] Out: -   BLOOD ADMINISTERED:none  DRAINS: none   LOCAL MEDICATIONS USED:  NONE  SPECIMEN:  Source of Specimen:  Rt ureteral / renal stone fragments  DISPOSITION OF SPECIMEN:  [facility] for compositional analysis  COUNTS:  YES  TOURNIQUET:  * No tourniquets in log *  DICTATION: .Other Dictation: Dictation Number [id number]  PLAN OF CARE: Discharge to home after PACU  PATIENT DISPOSITION:  PACU - hemodynamically stable.   Delay start of Pharmacological VTE agent (>24hrs) due to surgical blood loss or risk of bleeding: yes

## 2016-11-19 NOTE — Anesthesia Procedure Notes (Signed)
Procedure Name: LMA Insertion Date/Time: 11/19/2016 9:49 AM Performed by: Lyn Hollingshead Pre-anesthesia Checklist: Patient identified, Emergency Drugs available, Suction available, Patient being monitored and Timeout performed Patient Re-evaluated:Patient Re-evaluated prior to inductionOxygen Delivery Method: Circle system utilized Preoxygenation: Pre-oxygenation with 100% oxygen Intubation Type: IV induction Ventilation: Mask ventilation without difficulty LMA: LMA with gastric port inserted LMA Size: 4.0 Number of attempts: 1 Tube secured with: Tape Dental Injury: Teeth and Oropharynx as per pre-operative assessment

## 2016-11-19 NOTE — Interval H&P Note (Signed)
History and Physical Interval Note:  11/19/2016 9:18 AM  Wesley Ingram  has presented today for surgery, with the diagnosis of RIGHT URETERAL STONE  The various methods of treatment have been discussed with the patient and family. After consideration of risks, benefits and other options for treatment, the patient has consented to  Procedure(s): CYSTOSCOPY WITH RETROGRADE PYELOGRAM, URETEROSCOPY AND STENT PLACEMENT (Right) HOLMIUM LASER APPLICATION (Right) as a surgical intervention .  The patient's history has been reviewed, patient examined, no change in status, stable for surgery.  I have reviewed the patient's chart and labs.  Questions were answered to the patient's satisfaction.     Floy Riegler

## 2017-12-16 ENCOUNTER — Encounter (HOSPITAL_COMMUNITY): Payer: Self-pay

## 2017-12-16 ENCOUNTER — Emergency Department (HOSPITAL_COMMUNITY): Payer: BLUE CROSS/BLUE SHIELD

## 2017-12-16 ENCOUNTER — Emergency Department (HOSPITAL_COMMUNITY)
Admission: EM | Admit: 2017-12-16 | Discharge: 2017-12-16 | Disposition: A | Discharge status: Home / Self Care | Payer: BLUE CROSS/BLUE SHIELD | Attending: Emergency Medicine | Admitting: Emergency Medicine

## 2017-12-16 DIAGNOSIS — Z79899 Other long term (current) drug therapy: Secondary | ICD-10-CM | POA: Diagnosis not present

## 2017-12-16 DIAGNOSIS — R531 Weakness: Secondary | ICD-10-CM

## 2017-12-16 DIAGNOSIS — Z7901 Long term (current) use of anticoagulants: Secondary | ICD-10-CM | POA: Diagnosis not present

## 2017-12-16 DIAGNOSIS — E119 Type 2 diabetes mellitus without complications: Secondary | ICD-10-CM | POA: Diagnosis not present

## 2017-12-16 DIAGNOSIS — I10 Essential (primary) hypertension: Secondary | ICD-10-CM | POA: Insufficient documentation

## 2017-12-16 DIAGNOSIS — Z794 Long term (current) use of insulin: Secondary | ICD-10-CM | POA: Insufficient documentation

## 2017-12-16 DIAGNOSIS — M6281 Muscle weakness (generalized): Secondary | ICD-10-CM | POA: Diagnosis present

## 2017-12-16 DIAGNOSIS — Z8673 Personal history of transient ischemic attack (TIA), and cerebral infarction without residual deficits: Secondary | ICD-10-CM | POA: Insufficient documentation

## 2017-12-16 LAB — BASIC METABOLIC PANEL
ANION GAP: 11 (ref 5–15)
BUN: 22 mg/dL (ref 8–23)
CALCIUM: 9.6 mg/dL (ref 8.9–10.3)
CO2: 24 mmol/L (ref 22–32)
Chloride: 105 mmol/L (ref 98–111)
Creatinine, Ser: 1.06 mg/dL (ref 0.61–1.24)
Glucose, Bld: 85 mg/dL (ref 70–99)
Potassium: 4.2 mmol/L (ref 3.5–5.1)
Sodium: 140 mmol/L (ref 135–145)

## 2017-12-16 LAB — CBC WITH DIFFERENTIAL/PLATELET
BASOS ABS: 0.1 10*3/uL (ref 0.0–0.1)
BASOS PCT: 1 %
Eosinophils Absolute: 0.6 10*3/uL (ref 0.0–0.7)
Eosinophils Relative: 5 %
HEMATOCRIT: 41.2 % (ref 39.0–52.0)
HEMOGLOBIN: 13.8 g/dL (ref 13.0–17.0)
Lymphocytes Relative: 25 %
Lymphs Abs: 2.7 10*3/uL (ref 0.7–4.0)
MCH: 30.1 pg (ref 26.0–34.0)
MCHC: 33.5 g/dL (ref 30.0–36.0)
MCV: 90 fL (ref 78.0–100.0)
Monocytes Absolute: 1 10*3/uL (ref 0.1–1.0)
Monocytes Relative: 9 %
NEUTROS ABS: 6.5 10*3/uL (ref 1.7–7.7)
NEUTROS PCT: 60 %
Platelets: 251 10*3/uL (ref 150–400)
RBC: 4.58 MIL/uL (ref 4.22–5.81)
RDW: 13.6 % (ref 11.5–15.5)
WBC: 10.8 10*3/uL — AB (ref 4.0–10.5)

## 2017-12-16 NOTE — ED Triage Notes (Signed)
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 Pt reports yesterday morning he noticed left arm was more stiff and decrease mobility in left leg. Normally walks with quad cane, but foot drags now

## 2017-12-16 NOTE — ED Provider Notes (Signed)
[facility] EMERGENCY DEPARTMENT Provider Note   CSN: [id number] Arrival date & time: 12/16/17  1145     History   Chief Complaint Chief Complaint  Patient presents with  . Weakness    HPI Wesley Ingram is a 71 y.o. male.  HPI   He presents for evaluation of left leg weakness, first noticed yesterday making it hard to walk.  He has prior right brain stroke with weakness left arm greater than weakness left leg.  He denies headache, cough, chest pain, palpitations, nausea, vomiting, fever or chills.  He is taking his usual medications.  [facility] for evaluation of possible metastases.  He is also seen a gastroenterologist recently, but cannot remember what it was for.  There are no other known modifying factors.  Past Medical History:  Diagnosis Date  . Diabetes mellitus   . High triglycerides   . Hypertension   . Renal disorder   . Stroke (HCC) 2008   left hemiparesis    Patient Active Problem List   Diagnosis Date Noted  . Ileus (HCC) 10/19/2016  . History of CVA (cerebrovascular accident) 10/19/2016  . Nephrolithiasis 10/19/2016  . Lytic bone lesions on xray 10/19/2016  . Acute renal failure (ARF) (HCC) 10/19/2016  . Chronic anticoagulation 10/19/2016  . TIA (transient ischemic attack) 02/25/2011  . Wound cellulitis 02/25/2011  . CVA (cerebral vascular accident) (HCC) 02/23/2011  . DM (diabetes mellitus) (HCC) 02/23/2011  . HTN (hypertension) 02/23/2011  . Hyperlipidemia 02/23/2011    Past Surgical History:  Procedure Laterality Date  . CYSTOSCOPY WITH RETROGRADE PYELOGRAM, URETEROSCOPY AND STENT PLACEMENT Right 11/19/2016   Procedure: CYSTOSCOPY WITH RETROGRADE PYELOGRAM, URETEROSCOPY AND STENT EXCHANGE;  Surgeon: Manny, Theodore, MD;  Location: [facility];  Service: Urology;  Laterality: Right;  . CYSTOSCOPY WITH STENT PLACEMENT Right 10/19/2016   Procedure: CYSTOSCOPY WITH STENT PLACEMENT;  Surgeon: Manny,  Theodore, MD;  Location: [facility];  Service: Urology;  Laterality: Right;  . HERNIA REPAIR     bilateral inguinal  . HOLMIUM LASER APPLICATION Right 11/19/2016   Procedure: HOLMIUM LASER APPLICATION;  Surgeon: Manny, Theodore, MD;  Location: [facility];  Service: Urology;  Laterality: Right;  . TOE AMPUTATION    . TONSILLECTOMY          Home Medications    Prior to Admission medications   Medication Sig Start Date End Date Taking? Authorizing Provider  amLODipine (NORVASC) 2.5 MG tablet Take 1 tablet (2.5 mg total) by mouth daily. 10/27/16   Regalado, Belkys A, MD  apixaban (ELIQUIS) 5 MG TABS tablet Take 5 mg by mouth 2 (two) times daily.    [provider]  atorvastatin (LIPITOR) 20 MG tablet Take 10 mg by mouth at bedtime.    [provider]  baclofen (LIORESAL) 10 MG tablet Take 10 mg by mouth daily.    [provider]  gabapentin (NEURONTIN) 100 MG capsule Take 200 mg by mouth every morning.    [provider]  glipiZIDE (GLUCOTROL) 5 MG tablet Take 5 mg by mouth 2 (two) times daily before a meal.     [provider]  insulin glargine (LANTUS) 100 UNIT/ML injection Inject 0.08 mLs (8 Units total) into the skin daily. Patient not taking: Reported on 11/16/2016 10/27/16   Regalado, Belkys A, MD  ketorolac (TORADOL) 10 MG tablet Take 1 tablet (10 mg total) by mouth every 6 (six) hours as needed for moderate pain. Or stent   discomfort post-operatively. (pt has tolerated prior) 11/19/16   Alexis Frock, MD  losartan (COZAAR) 100 MG tablet Take 100 mg by mouth daily. 10/12/16   [provider]  Melatonin 1 MG TABS Take 3 mg by mouth at bedtime. [place]     [provider]  metFORMIN (GLUCOPHAGE) 1000 MG tablet Take 1,000 mg by mouth 2 (two) times daily with a meal.    [provider]  metoprolol succinate (TOPROL-XL) 12.5 mg TB24 24 hr tablet Take 12.5 mg by mouth daily.    [provider]  polyethylene  glycol (MIRALAX / GLYCOLAX) packet Take 17 g by mouth daily. 10/27/16   Regalado, Belkys A, MD  senna-docusate (SENOKOT-S) 8.6-50 MG tablet Take 1 tablet by mouth 2 (two) times daily. 10/26/16   Regalado, Belkys A, MD  sulfamethoxazole-trimethoprim (BACTRIM DS,SEPTRA DS) 800-160 MG tablet Take 1 tablet by mouth 2 (two) times daily. X 3 days. Begin day before next Urology appointment. 11/19/16   Alexis Frock, MD  vancomycin IVPB Inject 1,250 mg into the vein daily. Indication:  S. Epi bacteremia Last Day of Therapy:  5/24 Labs - Sunday/Monday:  CBC/D, BMP, and vancomycin trough. Labs - Thursday:  BMP and vancomycin trough Labs - Every other week:  ESR and CRP Patient not taking: Reported on 11/16/2016 10/26/16   Elmarie Shiley, MD    Family History Family History  Problem Relation Age of Onset  . Multiple myeloma Mother 51    Social History Social History   Tobacco Use  . Smoking status: Never Smoker  . Smokeless tobacco: Never Used  Substance Use Topics  . Alcohol use: No  . Drug use: No     Allergies   Patient has no known allergies.   Review of Systems Review of Systems  All other systems reviewed and are negative.    Physical Exam Updated Vital Signs BP 130/82   Pulse 70   Temp 98.2 F (36.8 C)   Resp 17   Ht 5' 10" (1.778 m)   Wt 72.6 kg (160 lb)   SpO2 96%   BMI 22.96 kg/m   Physical Exam  Constitutional: He is oriented to person, place, and time. He appears well-developed. No distress.  Elderly, frail  HENT:  Head: Normocephalic and atraumatic.  Right Ear: External ear normal.  Left Ear: External ear normal.  Eyes: Pupils are equal, round, and reactive to light. Conjunctivae and EOM are normal.  Neck: Normal range of motion and phonation normal. Neck supple.  Cardiovascular: Normal rate, regular rhythm and normal heart sounds.  Pulmonary/Chest: Effort normal and breath sounds normal. He exhibits no bony tenderness.  Abdominal: Soft. There is no  tenderness.  Musculoskeletal:  No large joint deformity.  With strength testing he is able to move the right arm and leg normally.  Left arm is held flexed at the elbow, and has notable contracture.  Left leg has decreased strength 4 5, he is able to move it, ultimately flexing and extending the left knee and raising it off the bed for greater than 5 seconds.  Neurological: He is alert and oriented to person, place, and time. No cranial nerve deficit. Coordination normal.  No dysarthria, aphasia or nystagmus.  Skin: Skin is warm, dry and intact.  Psychiatric: He has a normal mood and affect. His behavior is normal. Judgment and thought content normal.  Nursing note and vitals reviewed.    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 EXAM: MRI HEAD WITHOUT CONTRAST TECHNIQUE: Multiplanar, multiecho pulse sequences of the brain and surrounding structures were obtained without intravenous contrast. COMPARISON:  02/23/2011 brain MRI FINDINGS: Brain: No acute infarction, hemorrhage, hydrocephalus, extra-axial collection or mass lesion. FLAIR hyperintensity in the right corona radiata and centrum semiovale without mass effect, likely old white matter infarct. There  has also been a prior ventral right para median medulla insult, present since at least 2012, likely post ischemic. Chronic small vessel ischemia with confluent FLAIR hyperintense rim around the lateral ventricles. Vascular: Major flow voids are preserved. Skull and upper cervical spine: No evidence of marrow lesion Sinuses/Orbits: No acute finding Other: Significant and progressively motion degraded exam, which could obscure findings. IMPRESSION: 1. Moderately motion degraded. 2. No acute finding. 3. Chronic ischemic injury including a deep right cerebral white matter infarct that has occurred since 2012 comparison. Electronically Signed   By: Monte Fantasia M.D.   On: 12/16/2017 14:49    Procedures Procedures (including critical care time)  Medications Ordered in ED Medications - No data to display   Initial Impression / Assessment and Plan / ED Course  I have reviewed the triage vital signs and the nursing notes.  Pertinent labs & imaging results that were available during my care of the patient were reviewed by me and considered in my medical decision making (see chart for details).      Patient Vitals for the past 24 hrs:  BP Temp Temp src Pulse Resp SpO2 Height Weight  12/16/17 1400 130/82 - - 70 - 96 % - -  12/16/17 1359 - 98.2 F (36.8 C) - - - - - -  12/16/17 1330 125/82 - - 69 - 99 % - -  12/16/17 1300 122/65 - - 64 17 98 % - -  12/16/17 1230 134/86 - - 72 15 98 % - -  12/16/17 1153 - - - - - - 5' 10" (1.778 m) 72.6 kg (160 lb)  12/16/17 1152 137/82 98.2 F (36.8 C) Oral 78 18 97 % - -    3:23 PM Reevaluation with update and discussion. After initial assessment and treatment, an updated evaluation reveals no change in status he continues to be able to use his left leg while supine.  He is wife both state that he has had PT in the past for similar problems and not done any better after using it.  Findings discussed and questions answered. Daleen Bo   Medical Decision  Making: Nonspecific left leg weakness, appears to be chronic.  There is no evidence for acute CVA, or intracranial tumor causing left leg weakness.  Doubt spinal myelopathy.  Doubt systemic infection or metabolic instability.  CRITICAL CARE-no Performed by: Daleen Bo   Nursing Notes  Reviewed/ Care Coordinated Applicable Imaging Reviewed Interpretation of Laboratory Data incorporated into ED treatment  The patient appears reasonably screened and/or stabilized for discharge and I doubt any other medical condition or other EMC requiring further screening, evaluation, or treatment in the ED at this time prior to discharge.  Plan: Home Medications-continue current medications; Home Treatments-usual diet, push fluids; return here if the recommended treatment, does not improve the symptoms; Recommended follow up-PCP, as needed.   Final Clinical Impressions(s) / ED Diagnoses   Final diagnoses:  Weakness    ED Discharge Orders    None       , , MD 12/16/17 1526  

## 2017-12-16 NOTE — Discharge Instructions (Addendum)
The testing today is reassuring.  There is no sign of new stroke causing the left leg weakness.  Sure you are getting plenty of rest, eating and drinking well.  See your doctor for further evaluation and treatment as soon as possible.

## 2018-08-02 DIAGNOSIS — R5381 Other malaise: Secondary | ICD-10-CM | POA: Diagnosis not present

## 2018-08-02 DIAGNOSIS — W19XXXA Unspecified fall, initial encounter: Secondary | ICD-10-CM | POA: Diagnosis not present

## 2018-11-23 ENCOUNTER — Encounter: Payer: Self-pay | Admitting: Internal Medicine

## 2018-12-30 ENCOUNTER — Emergency Department (HOSPITAL_COMMUNITY)
Admission: EM | Admit: 2018-12-30 | Discharge: 2018-12-30 | Disposition: A | Discharge status: Home / Self Care | Payer: No Typology Code available for payment source | Attending: Emergency Medicine | Admitting: Emergency Medicine

## 2018-12-30 ENCOUNTER — Encounter (HOSPITAL_COMMUNITY): Payer: Self-pay | Admitting: Emergency Medicine

## 2018-12-30 ENCOUNTER — Other Ambulatory Visit: Payer: Self-pay

## 2018-12-30 ENCOUNTER — Emergency Department (HOSPITAL_COMMUNITY): Payer: No Typology Code available for payment source

## 2018-12-30 DIAGNOSIS — I10 Essential (primary) hypertension: Secondary | ICD-10-CM | POA: Insufficient documentation

## 2018-12-30 DIAGNOSIS — X58XXXA Exposure to other specified factors, initial encounter: Secondary | ICD-10-CM | POA: Insufficient documentation

## 2018-12-30 DIAGNOSIS — Y939 Activity, unspecified: Secondary | ICD-10-CM | POA: Insufficient documentation

## 2018-12-30 DIAGNOSIS — S91109A Unspecified open wound of unspecified toe(s) without damage to nail, initial encounter: Secondary | ICD-10-CM

## 2018-12-30 DIAGNOSIS — L03115 Cellulitis of right lower limb: Secondary | ICD-10-CM | POA: Diagnosis not present

## 2018-12-30 DIAGNOSIS — Y929 Unspecified place or not applicable: Secondary | ICD-10-CM | POA: Insufficient documentation

## 2018-12-30 DIAGNOSIS — Z79899 Other long term (current) drug therapy: Secondary | ICD-10-CM | POA: Insufficient documentation

## 2018-12-30 DIAGNOSIS — S91101A Unspecified open wound of right great toe without damage to nail, initial encounter: Secondary | ICD-10-CM | POA: Diagnosis not present

## 2018-12-30 DIAGNOSIS — E119 Type 2 diabetes mellitus without complications: Secondary | ICD-10-CM | POA: Insufficient documentation

## 2018-12-30 DIAGNOSIS — Y999 Unspecified external cause status: Secondary | ICD-10-CM | POA: Insufficient documentation

## 2018-12-30 DIAGNOSIS — Z8673 Personal history of transient ischemic attack (TIA), and cerebral infarction without residual deficits: Secondary | ICD-10-CM | POA: Diagnosis not present

## 2018-12-30 DIAGNOSIS — Z794 Long term (current) use of insulin: Secondary | ICD-10-CM | POA: Insufficient documentation

## 2018-12-30 LAB — CBC WITH DIFFERENTIAL/PLATELET
Abs Immature Granulocytes: 0.08 10*3/uL — ABNORMAL HIGH (ref 0.00–0.07)
Basophils Absolute: 0.1 10*3/uL (ref 0.0–0.1)
Basophils Relative: 1 %
Eosinophils Absolute: 0.5 10*3/uL (ref 0.0–0.5)
Eosinophils Relative: 4 %
HCT: 37.5 % — ABNORMAL LOW (ref 39.0–52.0)
Hemoglobin: 12.2 g/dL — ABNORMAL LOW (ref 13.0–17.0)
Immature Granulocytes: 1 %
Lymphocytes Relative: 13 %
Lymphs Abs: 1.7 10*3/uL (ref 0.7–4.0)
MCH: 29.9 pg (ref 26.0–34.0)
MCHC: 32.5 g/dL (ref 30.0–36.0)
MCV: 91.9 fL (ref 80.0–100.0)
Monocytes Absolute: 1.7 10*3/uL — ABNORMAL HIGH (ref 0.1–1.0)
Monocytes Relative: 13 %
Neutro Abs: 8.9 10*3/uL — ABNORMAL HIGH (ref 1.7–7.7)
Neutrophils Relative %: 68 %
Platelets: 235 10*3/uL (ref 150–400)
RBC: 4.08 MIL/uL — ABNORMAL LOW (ref 4.22–5.81)
RDW: 13.4 % (ref 11.5–15.5)
WBC: 12.9 10*3/uL — ABNORMAL HIGH (ref 4.0–10.5)
nRBC: 0 % (ref 0.0–0.2)

## 2018-12-30 LAB — BASIC METABOLIC PANEL
Anion gap: 9 (ref 5–15)
BUN: 31 mg/dL — ABNORMAL HIGH (ref 8–23)
CO2: 21 mmol/L — ABNORMAL LOW (ref 22–32)
Calcium: 9 mg/dL (ref 8.9–10.3)
Chloride: 104 mmol/L (ref 98–111)
Creatinine, Ser: 1.35 mg/dL — ABNORMAL HIGH (ref 0.61–1.24)
GFR calc Af Amer: >60 mL/min (ref >60)
GFR calc non Af Amer: 52 mL/min — ABNORMAL LOW (ref >60)
Glucose, Bld: 194 mg/dL — ABNORMAL HIGH (ref 70–99)
Potassium: 4.3 mmol/L (ref 3.5–5.1)
Sodium: 134 mmol/L — ABNORMAL LOW (ref 135–145)

## 2018-12-30 MED ORDER — BACITRACIN ZINC 500 UNIT/GM EX OINT
1.0000 "application " | TOPICAL_OINTMENT | Freq: Two times a day (BID) | CUTANEOUS | Status: DC
  Administered 2018-12-30: 1 via TOPICAL
  Filled 2018-12-30: qty 1.8

## 2018-12-30 MED ORDER — DOXYCYCLINE HYCLATE 100 MG PO TABS
100.0000 mg | ORAL_TABLET | Freq: Once | ORAL | Status: AC
  Administered 2018-12-30: 100 mg via ORAL
  Filled 2018-12-30: qty 1

## 2018-12-30 MED ORDER — BACITRACIN ZINC 500 UNIT/GM EX OINT: 1.0000 "application " | TOPICAL_OINTMENT | Freq: Two times a day (BID) | CUTANEOUS | 0 refills | Status: DC

## 2018-12-30 MED ORDER — DOXYCYCLINE HYCLATE 100 MG PO CAPS: 100.0000 mg | ORAL_CAPSULE | Freq: Two times a day (BID) | ORAL | 0 refills | Status: DC

## 2018-12-30 NOTE — Discharge Instructions (Signed)
I want you to start taking doxycycline twice a day for the next 10 days.  You should keep a clean sterile dressing on your toe at all times applying a topical antibiotic such as bacitracin twice a day.  You can get this medication from your pharmacy.  I have given you a prescription for the antibiotic and have given you the first dose here today.  Please seek medical exam for increasing pain, redness or fever or drainage or foul smell.  You should go to the emergency department if that happens.  Otherwise see your doctor in 48 hours for recheck.

## 2018-12-30 NOTE — ED Provider Notes (Signed)
[facility] EMERGENCY DEPARTMENT Provider Note   CSN: [id number] Arrival date & time: 12/30/18  1206    History   Chief Complaint Chief Complaint  Patient presents with  . Wound Check    HPI Wesley Ingram is a 73 y.o. male.     HPI  The patient is a 73 year old male, he has a known history of diabetes hypertension and a prior stroke with left hemiparesis.  He reports that 2 days ago he noticed that he had a large blister on his right great toe, this seemed to start peeling and yesterday or last night it continued to peel until the skin was gone.  Underneath that there was some healthy pink tissue which she reports is nontender thus he went to the urgent care in [place] and was redirected to the emergency department.  He denies fevers, significant pain, chills or any other significant findings.  Past Medical History:  Diagnosis Date  . Diabetes mellitus   . High triglycerides   . Hypertension   . Renal disorder   . Stroke [facility]) 2008   left hemiparesis    Patient Active Problem List   Diagnosis Date Noted  . Ileus ([place]) 10/19/2016  . History of CVA (cerebrovascular accident) 10/19/2016  . Nephrolithiasis 10/19/2016  . Lytic bone lesions on xray 10/19/2016  . Acute renal failure (ARF) ([place]) 10/19/2016  . Chronic anticoagulation 10/19/2016  . TIA (transient ischemic attack) 02/25/2011  . Wound cellulitis 02/25/2011  . CVA (cerebral vascular accident) ([place]) 02/23/2011  . DM (diabetes mellitus) ([place]) 02/23/2011  . HTN (hypertension) 02/23/2011  . Hyperlipidemia 02/23/2011    Past Surgical History:  Procedure Laterality Date  . CYSTOSCOPY WITH RETROGRADE PYELOGRAM, URETEROSCOPY AND STENT PLACEMENT Right 11/19/2016   Procedure: CYSTOSCOPY WITH RETROGRADE PYELOGRAM, URETEROSCOPY AND STENT EXCHANGE;  Surgeon: Alexis Frock, MD;  Location: [facility];  Service: Urology;  Laterality: Right;  . CYSTOSCOPY WITH STENT PLACEMENT Right 10/19/2016   Procedure: CYSTOSCOPY WITH STENT  PLACEMENT;  Surgeon: Alexis Frock, MD;  Location: [facility];  Service: Urology;  Laterality: Right;  . HERNIA REPAIR     bilateral inguinal  . HOLMIUM LASER APPLICATION Right 06/20/735   Procedure: HOLMIUM LASER APPLICATION;  Surgeon: Alexis Frock, MD;  Location: [facility];  Service: Urology;  Laterality: Right;  . TOE AMPUTATION    . TONSILLECTOMY          Home Medications    Prior to Admission medications   Medication Sig Start Date End Date Taking? Authorizing Provider  amLODipine (NORVASC) 2.5 MG tablet Take 1 tablet (2.5 mg total) by mouth daily. 10/27/16   Regalado, Belkys A, MD  apixaban (ELIQUIS) 5 MG TABS tablet Take 5 mg by mouth 2 (two) times daily.    [provider]  atorvastatin (LIPITOR) 20 MG tablet Take 10 mg by mouth at bedtime.    [provider]  bacitracin ointment Apply 1 application topically 2 (two) times daily. 12/30/18   Noemi Chapel, MD  baclofen (LIORESAL) 10 MG tablet Take 10 mg by mouth daily.    [provider]  doxycycline (VIBRAMYCIN) 100 MG capsule Take 1 capsule (100 mg total) by mouth 2 (two) times daily. 12/30/18   Noemi Chapel, MD  gabapentin (NEURONTIN) 100 MG capsule Take 200 mg by mouth every morning.    [provider]  glipiZIDE (GLUCOTROL) 5 MG tablet Take 5 mg by mouth 2 (two) times daily before a meal.     [provider]  insulin glargine (  LANTUS) 100 UNIT/ML injection Inject 0.08 mLs (8 Units total) into the skin daily. Patient not taking: Reported on 11/16/2016 10/27/16   Regalado, Jerald Kief A, MD  ketorolac (TORADOL) 10 MG tablet Take 1 tablet (10 mg total) by mouth every 6 (six) hours as needed for moderate pain. Or stent discomfort post-operatively. (pt has tolerated prior) 11/19/16   Alexis Frock, MD  losartan (COZAAR) 100 MG tablet Take 100 mg by mouth daily. 10/12/16   [provider]  Melatonin 1 MG TABS Take 3 mg by mouth at bedtime. [place]     [provider]   metFORMIN (GLUCOPHAGE) 1000 MG tablet Take 1,000 mg by mouth 2 (two) times daily with a meal.    [provider]  metoprolol succinate (TOPROL-XL) 12.5 mg TB24 24 hr tablet Take 12.5 mg by mouth daily.    [provider]  polyethylene glycol (MIRALAX / GLYCOLAX) packet Take 17 g by mouth daily. 10/27/16   Regalado, Belkys A, MD  senna-docusate (SENOKOT-S) 8.6-50 MG tablet Take 1 tablet by mouth 2 (two) times daily. 10/26/16   Regalado, Belkys A, MD  sulfamethoxazole-trimethoprim (BACTRIM DS,SEPTRA DS) 800-160 MG tablet Take 1 tablet by mouth 2 (two) times daily. X 3 days. Begin day before next Urology appointment. 11/19/16   Alexis Frock, MD  vancomycin IVPB Inject 1,250 mg into the vein daily. Indication:  S. Epi bacteremia Last Day of Therapy:  5/24 Labs - Sunday/Monday:  CBC/D, BMP, and vancomycin trough. Labs - Thursday:  BMP and vancomycin trough Labs - Every other week:  ESR and CRP Patient not taking: Reported on 11/16/2016 10/26/16   Elmarie Shiley, MD    Family History Family History  Problem Relation Age of Onset  . Multiple myeloma Mother 31    Social History Social History   Tobacco Use  . Smoking status: Never Smoker  . Smokeless tobacco: Never Used  Substance Use Topics  . Alcohol use: No  . Drug use: No     Allergies   Patient has no known allergies.   Review of Systems Review of Systems  Constitutional: Negative for fever.  Gastrointestinal: Negative for nausea and vomiting.  Musculoskeletal: Negative for gait problem.  Skin: Positive for wound.  Neurological: Negative for weakness and numbness.  All other systems reviewed and are negative.    Physical Exam Updated Vital Signs BP (!) 103/58 (BP Location: Right Arm)   Pulse 75   Temp 98.4 F (36.9 C) (Oral)   Resp 18   Ht 1.778 m ('5\' 10"' )   Wt 74.8 kg   SpO2 97%   BMI 23.68 kg/m   Physical Exam Vitals signs and nursing note reviewed.  Constitutional:      General: He is  not in acute distress.    Appearance: He is well-developed.  HENT:     Head: Normocephalic and atraumatic.     Mouth/Throat:     Pharynx: No oropharyngeal exudate.  Eyes:     General: No scleral icterus.       Right eye: No discharge.        Left eye: No discharge.     Conjunctiva/sclera: Conjunctivae normal.     Pupils: Pupils are equal, round, and reactive to light.  Neck:     Musculoskeletal: Normal range of motion and neck supple.     Thyroid: No thyromegaly.     Vascular: No JVD.  Cardiovascular:     Rate and Rhythm: Normal rate and regular rhythm.  Heart sounds: Normal heart sounds. No murmur. No friction rub. No gallop.   Pulmonary:     Effort: Pulmonary effort is normal. No respiratory distress.     Breath sounds: Normal breath sounds. No wheezing or rales.  Abdominal:     General: Bowel sounds are normal. There is no distension.     Palpations: Abdomen is soft. There is no mass.     Tenderness: There is no abdominal tenderness.  Musculoskeletal: Normal range of motion.        General: No tenderness.  Lymphadenopathy:     Cervical: No cervical adenopathy.  Skin:    General: Skin is warm and dry.     Findings: No erythema or rash.     Comments: There is a wound to the right great toe where there has been a large blister encompassing the distal and medial aspect of the toe.  The intertriginous area is intact.  The underlying tissue is red, nonbleeding, healthy and no surrounding induration or cellulitis though the foot itself on the right is slightly warm compared to the left.  There is normal pulses at both feet.  Neurological:     Mental Status: He is alert.     Coordination: Coordination normal.     Comments: Sensation is intact bilaterally, the patient does have some left-sided hemiparesis but is able to assist with getting out of the chair to the bed  Psychiatric:        Behavior: Behavior normal.      ED Treatments / Results  Labs (all labs ordered are  listed, but only abnormal results are displayed) Labs Reviewed  CBC WITH DIFFERENTIAL/PLATELET - Abnormal; Notable for the following components:      Result Value   WBC 12.9 (*)    RBC 4.08 (*)    Hemoglobin 12.2 (*)    HCT 37.5 (*)    Neutro Abs 8.9 (*)    Monocytes Absolute 1.7 (*)    Abs Immature Granulocytes 0.08 (*)    All other components within normal limits  BASIC METABOLIC PANEL - Abnormal; Notable for the following components:   Sodium 134 (*)    CO2 21 (*)    Glucose, Bld 194 (*)    BUN 31 (*)    Creatinine, Ser 1.35 (*)    GFR calc non Af Amer 52 (*)    All other components within normal limits    EKG None  Radiology Dg Foot Complete Right  Result Date: 12/30/2018 CLINICAL DATA:  Infection, right great toe EXAM: RIGHT FOOT COMPLETE - 3+ VIEW COMPARISON:  None. FINDINGS: No fracture or dislocation of the right foot. No bony erosion or sclerosis of the great toe to suggest osteomyelitis. There is moderate first metatarsophalangeal arthrosis. Mild midfoot arthrosis. The remaining joint spaces are well preserved. Mild soft tissue edema of the great toe. IMPRESSION: No fracture or dislocation of the right foot. No bony erosion or sclerosis of the great toe to suggest osteomyelitis. There is moderate first metatarsophalangeal arthrosis. Mild midfoot arthrosis. The remaining joint spaces are well preserved. Mild soft tissue edema of the great toe. MRI may be used to more sensitively evaluate for bone marrow edema and osteomyelitis if suspected. Electronically Signed   By: Eddie Candle M.D.   On: 12/30/2018 13:10    Procedures Procedures (including critical care time)  Medications Ordered in ED Medications  bacitracin ointment 1 application (1 application Topical Given 12/30/18 1256)  doxycycline (VIBRA-TABS) tablet 100 mg (100 mg Oral  Given 12/30/18 1256)     Initial Impression / Assessment and Plan / ED Course  I have reviewed the triage vital signs and the nursing  notes.  Pertinent labs & imaging results that were available during my care of the patient were reviewed by me and considered in my medical decision making (see chart for details).  Clinical Course as of Dec 30 1319  Sat Dec 30, 2018  1318 The patient does have a leukocytosis of just over 12,000, his x-ray reveals no signs of deep tissue infection or necrotizing fasciitis or osteomyelitis.  Metabolic panel shows mild hyperglycemia but no significant renal dysfunction.  He has been given doxycycline, wound care has been provided and at this time the patient is stable for discharge.  He is agreeable to the plan and has normal vital signs   [BM]    Clinical Course User Index [BM] Noemi Chapel, MD       The patient appears to have what looks like a fresh wound secondary to a blister that has ruptured, he does not know why he got this, he does not recall any injury or trauma, he does not apparently have any neuropathy.  I will obtain an x-ray to rule out underlying injury or osteomyelitis though I suspect this is a mild cellulitis given the warmth of the foot.  Local wound care, bacitracin, sterile dressing, patient is agreeable, he does not appear ill is not tachycardic febrile or hypotensive.  Final Clinical Impressions(s) / ED Diagnoses   Final diagnoses:  Cellulitis of foot, right  Open toe wound, initial encounter    ED Discharge Orders         Ordered    bacitracin ointment  2 times daily     12/30/18 1320    doxycycline (VIBRAMYCIN) 100 MG capsule  2 times daily     12/30/18 1320           Noemi Chapel, MD 12/30/18 1321

## 2018-12-30 NOTE — ED Triage Notes (Signed)
Pt brought in by his wife for an issue with his right great toe. States the skin came off of it yesterday, went to urgent care and was sent here.

## 2019-01-16 ENCOUNTER — Telehealth: Payer: Self-pay | Admitting: *Deleted

## 2019-01-16 ENCOUNTER — Ambulatory Visit (INDEPENDENT_AMBULATORY_CARE_PROVIDER_SITE_OTHER): Payer: No Typology Code available for payment source | Admitting: Gastroenterology

## 2019-01-16 ENCOUNTER — Encounter: Payer: Self-pay | Admitting: Gastroenterology

## 2019-01-16 ENCOUNTER — Other Ambulatory Visit: Payer: Self-pay

## 2019-01-16 DIAGNOSIS — R197 Diarrhea, unspecified: Secondary | ICD-10-CM | POA: Diagnosis not present

## 2019-01-16 NOTE — Telephone Encounter (Signed)
Received a call from [employer]. She states the male that was with patient came in to get stool cups for patient and she had a temp of 100.4. She states they let her wait for a while and rechecked and she was still 100.4. Per Vaughan Basta she will not be able to drop the stool cups off for patient d/t her temp and they made her aware of this. AM checked her temp in the office and she was 97.0.   I called patient and Esperenza answered (pt spouse). She states she was the one that picked up the stool cups. She states she was very hot and she had to lift patient in/out of the car. She is not sick or have any symptoms. She checked temp at home and it is normal. I made her aware per Vaughan Basta she was not able to drop the stool cups off. She states her daughter will take the cups back to the lab.  FYI to LSL

## 2019-01-16 NOTE — Progress Notes (Signed)
Primary Care Physician:  [facility]  Primary Gastroenterologist:  Garfield Cornea, MD   Chief Complaint  Patient presents with  . Colonoscopy    REF BY [facility]    HPI:  Wesley Ingram is a 73 y.o. male here at the request of [place] for consideration of colonoscopy. Issue dates from 08/22/18 to 02/19/19. He presents with his wife Wesley Ingram, who helps provide much of the history although patient is able to answer questions appropriately.   He reports a remote colonoscopy years ago with the [place] as well as at [facility]. Last one known about in 2009, diverticulosis. States he is due another colonoscopy.   Wife is concerned that patient is having diarrhea several times per day. Strong odor. Does have some intermittent formed stool. No melena, brbpr. No abd pain. Used to have constipation but for the better part of this year he has had diarrhea. He can take loperamide once per day before he goes out and it controls diarrhea. If he takes loperamide several days in a row he will get constipation. Appetite is very good. No N/V. No heartburn or dysphagia. No weight loss. Does not recall any recent medication changes. Has been on current metformin dose for years. 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 He had a blood clot about 3 years ago. He is on Eliquis.   Recent labs 12/30/18 with wbc 12,900, H/H 12.2/37.5, platelets 235,000, bun 31, creatinine 1.35. glucose 194.   Patient has been on recent antibiotics for toe ulcer/wound.   CT A/P with contrast 10/2016: multiple small lytic appearing lesions throughout bony structures, concerning for underlying neoplasia/metastatic disease. Serum electrophoresis negative for MM. Was supposed to follow up with oncology as outpatient and unclear if this was done.   No current facility-administered medications on file prior to visit.    Current Outpatient Medications on File Prior to Visit  Medication  Sig Dispense Refill  . amLODipine (NORVASC) 2.5 MG tablet Take 1 tablet (2.5 mg total) by mouth daily. 30 tablet 0  . apixaban (ELIQUIS) 5 MG TABS tablet Take 5 mg by mouth 2 (two) times daily.    Marland Kitchen atorvastatin (LIPITOR) 20 MG tablet Take 20 mg by mouth every morning.     Marland Kitchen b complex vitamins tablet Take 1 tablet by mouth 2 (two) times daily.    . bacitracin ointment Apply 1 application topically 2 (two) times daily. 120 g 0  . glipiZIDE (GLUCOTROL) 5 MG tablet Take 5 mg by mouth 2 (two) times daily before a meal.     . Lactobacillus (ACIDOPHILUS) 0.5 MG TABS Take 2 tablets by mouth 2 (two) times daily.     Marland Kitchen loperamide (LOPERAMIDE A-D) 2 MG tablet Take 2 mg by mouth daily as needed for diarrhea or loose stools.     Marland Kitchen losartan (COZAAR) 100 MG tablet Take 100 mg by mouth daily.  0  . Melatonin 1 MG TABS Take 3 mg by mouth at bedtime. [place]     . metFORMIN (GLUCOPHAGE) 1000 MG tablet Take 1,000 mg by mouth 2 (two) times daily with a meal.    . metoprolol succinate (TOPROL-XL) 12.5 mg TB24 24 hr tablet Take 12.5 mg by mouth daily.       Allergies as of 01/16/2019  . (No Known Allergies)    Past Medical History:  Diagnosis Date  . Diabetes mellitus   . DVT (deep venous thrombosis) ([place])   .  Foot ulcer due to secondary DM ([place])   . High triglycerides   . Hypertension   . PVD (peripheral vascular disease) ([place])   . Renal disorder   . Stroke [facility]) 2008   left hemiparesis    Past Surgical History:  Procedure Laterality Date  . COLONOSCOPY  2009   [place] in [place]: diverticulosis, next TCS in 07/2017  . CYSTOSCOPY WITH RETROGRADE PYELOGRAM, URETEROSCOPY AND STENT PLACEMENT Right 11/19/2016   Procedure: CYSTOSCOPY WITH RETROGRADE PYELOGRAM, URETEROSCOPY AND STENT EXCHANGE;  Surgeon: Alexis Frock, MD;  Location: [facility];  Service: Urology;  Laterality: Right;  . CYSTOSCOPY WITH STENT PLACEMENT Right 10/19/2016   Procedure: CYSTOSCOPY WITH STENT PLACEMENT;   Surgeon: Alexis Frock, MD;  Location: [facility];  Service: Urology;  Laterality: Right;  . ENDARTERECTOMY     Left popliteal artery endarterectomy with left above-knee popliteal artery to tibial peroneal trunk bypass with reverse saphenous vein  . HERNIA REPAIR     bilateral inguinal  . HOLMIUM LASER APPLICATION Right 7/0/3500   Procedure: HOLMIUM LASER APPLICATION;  Surgeon: Alexis Frock, MD;  Location: [facility];  Service: Urology;  Laterality: Right;  . TOE AMPUTATION    . TONSILLECTOMY      Family History  Problem Relation Age of Onset  . Multiple myeloma Mother 13  . Colon cancer Neg Hx     Social History   Socioeconomic History  . Marital status: Married    Spouse name: Not on file  . Number of children: Not on file  . Years of education: Not on file  . Highest education level: Not on file  Occupational History  . Occupation: disabled  Social Needs  . Financial resource strain: Not on file  . Food insecurity    Worry: Not on file    Inability: Not on file  . Transportation needs    Medical: Not on file    Non-medical: Not on file  Tobacco Use  . Smoking status: Never Smoker  . Smokeless tobacco: Never Used  Substance and Sexual Activity  . Alcohol use: No  . Drug use: No  . Sexual activity: Not on file  Lifestyle  . Physical activity    Days per week: Not on file    Minutes per session: Not on file  . Stress: Not on file  Relationships  . Social Herbalist on phone: Not on file    Gets together: Not on file    Attends religious service: Not on file    Active member of club or organization: Not on file    Attends meetings of clubs or organizations: Not on file    Relationship status: Not on file  . Intimate partner violence    Fear of current or ex partner: Not on file    Emotionally abused: Not on file    Physically abused: Not on file    Forced sexual activity: Not on file  Other Topics Concern  . Not on file  Social History Narrative  .  Not on file      ROS:  General: Negative for anorexia, weight loss, fever, chills, fatigue, +weakness. Eyes: Negative for vision changes.  ENT: Negative for hoarseness, difficulty swallowing , nasal congestion. CV: Negative for chest pain, angina, palpitations, dyspnea on exertion, peripheral edema.  Respiratory: Negative for dyspnea at rest, dyspnea on exertion, cough, sputum, wheezing.  GI: See history of present illness. GU:  Negative for dysuria, hematuria, urinary incontinence, urinary frequency, nocturnal  urination.  MS: Negative for joint pain, low back pain.  Derm: Negative for rash or itching.  Neuro: Negative for, seizure, frequent headaches, memory loss, confusion. +left sided weakness Psych: Negative for anxiety, depression, suicidal ideation, hallucinations.  Endo: Negative for unusual weight change.  Heme: Negative for bruising or bleeding. Allergy: Negative for rash or hives.    Physical Examination:  BP 102/63   Pulse 72   Temp (!) 97.2 F (36.2 C) (Oral)   Ht '5\' 10"'  (1.778 m)   Wt 160 lb (72.6 kg)   BMI 22.96 kg/m    General: frail appearing male in NAD. Appears older than states age. Minimal ambulation from chair to table with assistance.   Head: Normocephalic, atraumatic.   Eyes: Conjunctiva pink, no icterus. Mouth: Oropharyngeal mucosa moist and pink , no lesions erythema or exudate. Neck: Supple without thyromegaly, masses, or lymphadenopathy.  Lungs: Clear to auscultation bilaterally.  Heart: Regular rate and rhythm, no murmurs rubs or gallops.  Abdomen: Bowel sounds are normal, nontender, nondistended, no hepatosplenomegaly or masses, no abdominal bruits or    hernia , no rebound or guarding.   Rectal: not performed Extremities: 1+ lower extremity edema. No clubbing or deformities. Poor control of right upper and lower extremity.  Neuro: Alert and oriented x 4 , grossly normal neurologically.  Skin: Warm and dry, no rash or jaundice.   Psych: Alert  and cooperative, normal mood and affect.  Labs: Lab Results  Component Value Date   CREATININE 1.11 01/19/2019   BUN 37 (H) 01/17/2019   NA 133 (L) 01/17/2019   K 4.4 01/17/2019   CL 102 01/17/2019   CO2 22 01/17/2019    Lab Results  Component Value Date   WBC 13.2 (H) 01/19/2019   HGB 11.4 (L) 01/19/2019   HCT 36.2 (L) 01/19/2019   MCV 92.3 01/19/2019   PLT 344 01/19/2019     Imaging Studies: Mr Foot Right Wo Contrast  Result Date: 01/18/2019 CLINICAL DATA:  Diabetic patient with an open wound on the right great toe. EXAM: MRI OF THE RIGHT FOREFOOT WITHOUT CONTRAST TECHNIQUE: Multiplanar, multisequence MR imaging of the right forefoot was performed. No intravenous contrast was administered. COMPARISON:  Plain films right foot 01/17/2019. FINDINGS: Bones/Joint/Cartilage Motion degrades the examination. There is marrow edema throughout the distal phalanx of the great toe consistent with osteomyelitis. No other evidence of osteomyelitis is identified. Ligaments Intact. Muscles and Tendons Intact. No intramuscular fluid collection. 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 Soft tissues Skin wound at the distal aspect of the great toe is identified. No abscess is seen. IMPRESSION: Motion degraded examination demonstrates marrow edema throughout the distal phalanx of the great toe consistent with osteomyelitis. No abscess is identified. No evidence of septic joint. Electronically Signed   By: Inge Rise M.D.   On: 01/18/2019 07:35   US Arterial Abi (screening Lower Extremity)  Result Date: 01/18/2019 CLINICAL DATA:  Right diabetic open foot wound, great toe. Previous amputation. EXAM: NONINVASIVE PHYSIOLOGIC VASCULAR STUDY OF BILATERAL LOWER EXTREMITIES TECHNIQUE: Evaluation of both lower extremities were performed at rest, including calculation of ankle-brachial indices with single level Doppler, pressure and pulse  volume recording. COMPARISON:  None. FINDINGS: Right ABI:  1.28 Left ABI:  1.16 Right Lower Extremity:  Multiphasic waveform at the ankle. Left Lower Extremity:  Multiphasic waveform at the ankle. IMPRESSION: Normal bilateral ABIs. Vascular calcification and resultant limited compressibility in the setting of diabetes  can result in elevated values however. Electronically Signed   By: Lucrezia Europe M.D.   On: 01/18/2019 09:50   Dg Foot Complete Right  Result Date: 01/17/2019 CLINICAL DATA:  Swelling and drainage from right great toe. History of diabetes. EXAM: RIGHT FOOT COMPLETE - 3+ VIEW COMPARISON:  Radiographs 12/30/2018 FINDINGS: No acute fracture or traumatic malalignment. There is soft tissue swelling and ulceration at the tip of the first digit. No subcutaneous gas few punctate radiodensities are present within the soft tissues which are new from prior exam and could reflect debris or foreign body. No bony erosion, destructive change or immature periostitis to suggest early radiographic features of osteomyelitis. 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 Hammertoe deformities of the third and fourth digits. May mild mid foot arthrosis is present as well. Please note alignment of the mid and hindfoot is incompletely assessed on nonweightbearing radiographs. Calcaneal spurs are present at the Achilles and plantar ligament insertions. IMPRESSION: 1. Soft tissue swelling and ulceration at the tip of the first digit. Few punctate radiodensities within the soft tissues are new from prior exam and could reflect debris or foreign body and should be correlated with visual inspection. 2. No radiographic evidence of osteomyelitis. MRI is more sensitive and specific for early findings of osteomyelitis. Electronically Signed   By: Lovena Le M.D.   On: 01/17/2019 21:41   Dg Foot Complete Right  Result Date: 12/30/2018 CLINICAL DATA:  Infection, right great toe EXAM: RIGHT FOOT COMPLETE - 3+  VIEW COMPARISON:  None. FINDINGS: No fracture or dislocation of the right foot. No bony erosion or sclerosis of the great toe to suggest osteomyelitis. There is moderate first metatarsophalangeal arthrosis. Mild midfoot arthrosis. The remaining joint spaces are well preserved. Mild soft tissue edema of the great toe. IMPRESSION: No fracture or dislocation of the right foot. No bony erosion or sclerosis of the great toe to suggest osteomyelitis. There is moderate first metatarsophalangeal arthrosis. Mild midfoot arthrosis. The remaining joint spaces are well preserved. Mild soft tissue edema of the great toe. MRI may be used to more sensitively evaluate for bone marrow edema and osteomyelitis if suspected. Electronically Signed   By: Eddie Candle M.D.   On: 12/30/2018 13:10

## 2019-01-16 NOTE — Patient Instructions (Signed)
1. Please collect stool specimen as soon as possible. If no signs of infection, we can proceed with colonoscopy.

## 2019-01-16 NOTE — Telephone Encounter (Signed)
Noted  

## 2019-01-17 ENCOUNTER — Encounter: Payer: Self-pay | Admitting: Gastroenterology

## 2019-01-17 ENCOUNTER — Inpatient Hospital Stay (HOSPITAL_COMMUNITY)
Admission: EM | Admit: 2019-01-17 | Discharge: 2019-01-29 | DRG: 617 | Disposition: A | Discharge status: Skilled Nursing Facility | Payer: Medicare Other | Attending: Internal Medicine | Admitting: Internal Medicine

## 2019-01-17 ENCOUNTER — Encounter (HOSPITAL_COMMUNITY): Payer: Self-pay

## 2019-01-17 ENCOUNTER — Encounter (HOSPITAL_COMMUNITY): Payer: Self-pay | Admitting: Internal Medicine

## 2019-01-17 ENCOUNTER — Emergency Department (HOSPITAL_COMMUNITY): Payer: Medicare Other

## 2019-01-17 ENCOUNTER — Other Ambulatory Visit: Payer: Self-pay

## 2019-01-17 DIAGNOSIS — L97516 Non-pressure chronic ulcer of other part of right foot with bone involvement without evidence of necrosis: Secondary | ICD-10-CM | POA: Diagnosis present

## 2019-01-17 DIAGNOSIS — M6281 Muscle weakness (generalized): Secondary | ICD-10-CM | POA: Diagnosis not present

## 2019-01-17 DIAGNOSIS — Z7984 Long term (current) use of oral hypoglycemic drugs: Secondary | ICD-10-CM

## 2019-01-17 DIAGNOSIS — E119 Type 2 diabetes mellitus without complications: Secondary | ICD-10-CM

## 2019-01-17 DIAGNOSIS — Z20828 Contact with and (suspected) exposure to other viral communicable diseases: Secondary | ICD-10-CM | POA: Diagnosis present

## 2019-01-17 DIAGNOSIS — E781 Pure hyperglyceridemia: Secondary | ICD-10-CM | POA: Diagnosis present

## 2019-01-17 DIAGNOSIS — I10 Essential (primary) hypertension: Secondary | ICD-10-CM | POA: Diagnosis not present

## 2019-01-17 DIAGNOSIS — R279 Unspecified lack of coordination: Secondary | ICD-10-CM | POA: Diagnosis not present

## 2019-01-17 DIAGNOSIS — E871 Hypo-osmolality and hyponatremia: Secondary | ICD-10-CM | POA: Diagnosis not present

## 2019-01-17 DIAGNOSIS — M86671 Other chronic osteomyelitis, right ankle and foot: Secondary | ICD-10-CM | POA: Diagnosis not present

## 2019-01-17 DIAGNOSIS — R262 Difficulty in walking, not elsewhere classified: Secondary | ICD-10-CM | POA: Diagnosis not present

## 2019-01-17 DIAGNOSIS — M869 Osteomyelitis, unspecified: Secondary | ICD-10-CM | POA: Diagnosis present

## 2019-01-17 DIAGNOSIS — E1165 Type 2 diabetes mellitus with hyperglycemia: Secondary | ICD-10-CM | POA: Diagnosis not present

## 2019-01-17 DIAGNOSIS — D638 Anemia in other chronic diseases classified elsewhere: Secondary | ICD-10-CM | POA: Diagnosis present

## 2019-01-17 DIAGNOSIS — I69354 Hemiplegia and hemiparesis following cerebral infarction affecting left non-dominant side: Secondary | ICD-10-CM | POA: Diagnosis not present

## 2019-01-17 DIAGNOSIS — E1169 Type 2 diabetes mellitus with other specified complication: Secondary | ICD-10-CM | POA: Diagnosis not present

## 2019-01-17 DIAGNOSIS — L03115 Cellulitis of right lower limb: Secondary | ICD-10-CM | POA: Diagnosis not present

## 2019-01-17 DIAGNOSIS — Z741 Need for assistance with personal care: Secondary | ICD-10-CM | POA: Diagnosis not present

## 2019-01-17 DIAGNOSIS — E1159 Type 2 diabetes mellitus with other circulatory complications: Secondary | ICD-10-CM | POA: Diagnosis not present

## 2019-01-17 DIAGNOSIS — D649 Anemia, unspecified: Secondary | ICD-10-CM | POA: Diagnosis present

## 2019-01-17 DIAGNOSIS — S91301A Unspecified open wound, right foot, initial encounter: Secondary | ICD-10-CM | POA: Diagnosis not present

## 2019-01-17 DIAGNOSIS — Z7401 Bed confinement status: Secondary | ICD-10-CM | POA: Diagnosis not present

## 2019-01-17 DIAGNOSIS — R197 Diarrhea, unspecified: Secondary | ICD-10-CM | POA: Diagnosis not present

## 2019-01-17 DIAGNOSIS — L97509 Non-pressure chronic ulcer of other part of unspecified foot with unspecified severity: Secondary | ICD-10-CM | POA: Diagnosis not present

## 2019-01-17 DIAGNOSIS — E785 Hyperlipidemia, unspecified: Secondary | ICD-10-CM | POA: Diagnosis present

## 2019-01-17 DIAGNOSIS — Z86718 Personal history of other venous thrombosis and embolism: Secondary | ICD-10-CM

## 2019-01-17 DIAGNOSIS — Z89422 Acquired absence of other left toe(s): Secondary | ICD-10-CM

## 2019-01-17 DIAGNOSIS — Z89411 Acquired absence of right great toe: Secondary | ICD-10-CM | POA: Diagnosis not present

## 2019-01-17 DIAGNOSIS — Z03818 Encounter for observation for suspected exposure to other biological agents ruled out: Secondary | ICD-10-CM | POA: Diagnosis not present

## 2019-01-17 DIAGNOSIS — M2550 Pain in unspecified joint: Secondary | ICD-10-CM | POA: Diagnosis not present

## 2019-01-17 DIAGNOSIS — Z7901 Long term (current) use of anticoagulants: Secondary | ICD-10-CM

## 2019-01-17 DIAGNOSIS — L03031 Cellulitis of right toe: Secondary | ICD-10-CM | POA: Diagnosis not present

## 2019-01-17 DIAGNOSIS — I152 Hypertension secondary to endocrine disorders: Secondary | ICD-10-CM | POA: Diagnosis present

## 2019-01-17 DIAGNOSIS — E1149 Type 2 diabetes mellitus with other diabetic neurological complication: Secondary | ICD-10-CM | POA: Diagnosis not present

## 2019-01-17 DIAGNOSIS — Z4781 Encounter for orthopedic aftercare following surgical amputation: Secondary | ICD-10-CM | POA: Diagnosis not present

## 2019-01-17 DIAGNOSIS — I739 Peripheral vascular disease, unspecified: Secondary | ICD-10-CM | POA: Diagnosis not present

## 2019-01-17 DIAGNOSIS — M86171 Other acute osteomyelitis, right ankle and foot: Secondary | ICD-10-CM | POA: Diagnosis not present

## 2019-01-17 DIAGNOSIS — L97519 Non-pressure chronic ulcer of other part of right foot with unspecified severity: Secondary | ICD-10-CM

## 2019-01-17 DIAGNOSIS — E11621 Type 2 diabetes mellitus with foot ulcer: Secondary | ICD-10-CM | POA: Diagnosis not present

## 2019-01-17 DIAGNOSIS — E11649 Type 2 diabetes mellitus with hypoglycemia without coma: Secondary | ICD-10-CM | POA: Diagnosis not present

## 2019-01-17 DIAGNOSIS — R5381 Other malaise: Secondary | ICD-10-CM | POA: Diagnosis not present

## 2019-01-17 DIAGNOSIS — L97511 Non-pressure chronic ulcer of other part of right foot limited to breakdown of skin: Secondary | ICD-10-CM | POA: Diagnosis not present

## 2019-01-17 LAB — BASIC METABOLIC PANEL
Anion gap: 9 (ref 5–15)
BUN: 37 mg/dL — ABNORMAL HIGH (ref 8–23)
CO2: 22 mmol/L (ref 22–32)
Calcium: 8.9 mg/dL (ref 8.9–10.3)
Chloride: 102 mmol/L (ref 98–111)
Creatinine, Ser: 1.21 mg/dL (ref 0.61–1.24)
GFR calc Af Amer: >60 mL/min (ref >60)
GFR calc non Af Amer: 60 mL/min — ABNORMAL LOW (ref >60)
Glucose, Bld: 181 mg/dL — ABNORMAL HIGH (ref 70–99)
Potassium: 4.4 mmol/L (ref 3.5–5.1)
Sodium: 133 mmol/L — ABNORMAL LOW (ref 135–145)

## 2019-01-17 LAB — CBC WITH DIFFERENTIAL/PLATELET
Abs Immature Granulocytes: 0.21 10*3/uL — ABNORMAL HIGH (ref 0.00–0.07)
Basophils Absolute: 0.1 10*3/uL (ref 0.0–0.1)
Basophils Relative: 1 %
Eosinophils Absolute: 0.4 10*3/uL (ref 0.0–0.5)
Eosinophils Relative: 4 %
HCT: 36 % — ABNORMAL LOW (ref 39.0–52.0)
Hemoglobin: 11.7 g/dL — ABNORMAL LOW (ref 13.0–17.0)
Immature Granulocytes: 2 %
Lymphocytes Relative: 18 %
Lymphs Abs: 2.2 10*3/uL (ref 0.7–4.0)
MCH: 29.2 pg (ref 26.0–34.0)
MCHC: 32.5 g/dL (ref 30.0–36.0)
MCV: 89.8 fL (ref 80.0–100.0)
Monocytes Absolute: 1.4 10*3/uL — ABNORMAL HIGH (ref 0.1–1.0)
Monocytes Relative: 12 %
Neutro Abs: 7.5 10*3/uL (ref 1.7–7.7)
Neutrophils Relative %: 63 %
Platelets: 364 10*3/uL (ref 150–400)
RBC: 4.01 MIL/uL — ABNORMAL LOW (ref 4.22–5.81)
RDW: 13 % (ref 11.5–15.5)
WBC: 11.8 10*3/uL — ABNORMAL HIGH (ref 4.0–10.5)
nRBC: 0 % (ref 0.0–0.2)

## 2019-01-17 LAB — C-REACTIVE PROTEIN: CRP: 1.8 mg/dL — ABNORMAL HIGH (ref <1.0)

## 2019-01-17 MED ORDER — AMLODIPINE BESYLATE 2.5 MG PO TABS
2.5000 mg | ORAL_TABLET | Freq: Every day | ORAL | Status: DC
  Administered 2019-01-18 – 2019-01-29 (×12): 2.5 mg via ORAL
  Filled 2019-01-17 (×13): qty 1

## 2019-01-17 MED ORDER — VANCOMYCIN HCL IN DEXTROSE 1-5 GM/200ML-% IV SOLN
1000.0000 mg | Freq: Once | INTRAVENOUS | Status: AC
  Administered 2019-01-17: 23:00:00 1000 mg via INTRAVENOUS
  Filled 2019-01-17: qty 200

## 2019-01-17 MED ORDER — ACETAMINOPHEN 650 MG RE SUPP: 650.0000 mg | Freq: Four times a day (QID) | RECTAL | Status: DC | PRN

## 2019-01-17 MED ORDER — ATORVASTATIN CALCIUM 10 MG PO TABS
20.0000 mg | ORAL_TABLET | Freq: Every morning | ORAL | Status: DC
  Administered 2019-01-18 – 2019-01-29 (×12): 20 mg via ORAL
  Filled 2019-01-17 (×2): qty 1
  Filled 2019-01-17 (×2): qty 2
  Filled 2019-01-17 (×2): qty 1
  Filled 2019-01-17 (×6): qty 2
  Filled 2019-01-17: qty 1

## 2019-01-17 MED ORDER — METFORMIN HCL 500 MG PO TABS
1000.0000 mg | ORAL_TABLET | Freq: Two times a day (BID) | ORAL | Status: DC
  Administered 2019-01-18 (×2): 1000 mg via ORAL
  Filled 2019-01-17 (×2): qty 2

## 2019-01-17 MED ORDER — METRONIDAZOLE 500 MG PO TABS
500.0000 mg | ORAL_TABLET | Freq: Three times a day (TID) | ORAL | Status: DC
  Administered 2019-01-17 – 2019-01-23 (×18): 500 mg via ORAL
  Filled 2019-01-17 (×18): qty 1

## 2019-01-17 MED ORDER — LOSARTAN POTASSIUM 50 MG PO TABS
100.0000 mg | ORAL_TABLET | Freq: Every day | ORAL | Status: DC
  Administered 2019-01-18: 100 mg via ORAL
  Filled 2019-01-17: qty 4

## 2019-01-17 MED ORDER — VANCOMYCIN HCL IN DEXTROSE 750-5 MG/150ML-% IV SOLN: 750.0000 mg | Freq: Two times a day (BID) | INTRAVENOUS | Status: DC

## 2019-01-17 MED ORDER — SODIUM CHLORIDE 0.9 % IV SOLN
2.0000 g | INTRAVENOUS | Status: DC
  Administered 2019-01-17 – 2019-01-22 (×6): 2 g via INTRAVENOUS
  Filled 2019-01-17 (×7): qty 20
  Filled 2019-01-17: qty 2

## 2019-01-17 MED ORDER — SODIUM CHLORIDE 0.9 % IV BOLUS
500.0000 mL | Freq: Once | INTRAVENOUS | Status: AC
  Administered 2019-01-17: 500 mL via INTRAVENOUS

## 2019-01-17 MED ORDER — APIXABAN 5 MG PO TABS
5.0000 mg | ORAL_TABLET | Freq: Two times a day (BID) | ORAL | Status: DC
  Administered 2019-01-18 – 2019-01-22 (×10): 5 mg via ORAL
  Filled 2019-01-17 (×10): qty 1

## 2019-01-17 MED ORDER — MELATONIN 1 MG PO TABS: 3.0000 mg | ORAL_TABLET | Freq: Every day | ORAL | Status: DC

## 2019-01-17 MED ORDER — GLIPIZIDE 5 MG PO TABS
5.0000 mg | ORAL_TABLET | Freq: Two times a day (BID) | ORAL | Status: DC
  Administered 2019-01-18 – 2019-01-19 (×2): 5 mg via ORAL
  Filled 2019-01-17 (×4): qty 1

## 2019-01-17 MED ORDER — INSULIN ASPART 100 UNIT/ML ~~LOC~~ SOLN
0.0000 [IU] | Freq: Three times a day (TID) | SUBCUTANEOUS | Status: DC
  Administered 2019-01-18 – 2019-01-20 (×3): 3 [IU] via SUBCUTANEOUS
  Administered 2019-01-20 – 2019-01-22 (×3): 2 [IU] via SUBCUTANEOUS
  Administered 2019-01-22: 3 [IU] via SUBCUTANEOUS
  Administered 2019-01-23: 5 [IU] via SUBCUTANEOUS
  Administered 2019-01-24: 3 [IU] via SUBCUTANEOUS
  Administered 2019-01-24: 2 [IU] via SUBCUTANEOUS
  Administered 2019-01-25: 5 [IU] via SUBCUTANEOUS
  Administered 2019-01-25: 12:00:00 3 [IU] via SUBCUTANEOUS
  Administered 2019-01-26: 5 [IU] via SUBCUTANEOUS
  Administered 2019-01-26: 3 [IU] via SUBCUTANEOUS
  Administered 2019-01-27: 2 [IU] via SUBCUTANEOUS
  Administered 2019-01-27: 5 [IU] via SUBCUTANEOUS
  Administered 2019-01-27: 2 [IU] via SUBCUTANEOUS
  Administered 2019-01-28: 1 [IU] via SUBCUTANEOUS
  Administered 2019-01-28 – 2019-01-29 (×2): 2 [IU] via SUBCUTANEOUS

## 2019-01-17 MED ORDER — LOPERAMIDE HCL 2 MG PO CAPS
2.0000 mg | ORAL_CAPSULE | Freq: Every day | ORAL | Status: DC | PRN
  Administered 2019-01-20: 2 mg via ORAL
  Filled 2019-01-17 (×2): qty 1

## 2019-01-17 MED ORDER — METOPROLOL SUCCINATE ER 25 MG PO TB24
12.5000 mg | ORAL_TABLET | Freq: Every day | ORAL | Status: DC
  Administered 2019-01-18 – 2019-01-19 (×2): 12.5 mg via ORAL
  Administered 2019-01-20: 12:00:00 via ORAL
  Administered 2019-01-21 – 2019-01-29 (×9): 12.5 mg via ORAL
  Filled 2019-01-17 (×13): qty 1

## 2019-01-17 MED ORDER — ACETAMINOPHEN 325 MG PO TABS
650.0000 mg | ORAL_TABLET | Freq: Four times a day (QID) | ORAL | Status: DC | PRN
  Administered 2019-01-25: 650 mg via ORAL
  Filled 2019-01-17 (×3): qty 2

## 2019-01-17 NOTE — ED Triage Notes (Signed)
Pt reports that nurse practioneer came by his home to dress his rt great toe and told pt to come to the hospital because he may need antibotic

## 2019-01-17 NOTE — Assessment & Plan Note (Signed)
Pleasant 73 y/o male with PMH of remote CVA, h/o blood clot now on Eliquis, who presents at the request of the [place] for colonoscopy. He complains of diarrhea for the better part of this year. He has been off/on antibiotics for foot ulcer. He reports he is due for a colonoscopy. Before consideration of colonoscopy, would recommend stool studies to rule out infectious etiology in particular C.diff.

## 2019-01-17 NOTE — H&P (Signed)
History and Physical    Wesley Ingram QIO:[id number] DOB: August 20, 1945 DOA: 01/17/2019  PCP: [facility]  Patient coming from: Home.  I have personally briefly reviewed patient's old medical records in [facility]  Chief Complaint: Infected toe.  HPI: Wesley Ingram is a 73 y.o. male with medical history significant of type 2 diabetes, hypertriglyceridemia, hypertension, renal disorder, history of CVA with residual left-sided hemiparesis who is coming to the emergency department for evaluation of his left big toe that after he was seen earlier by his home health nurse.  He does not know how he acquired this injury, but states that he has had it for at least 2 weeks.  He denies fever and night sweats, but feels chills and fatigue.  No headache, rhinorrhea, sore throat, wheezing, hemoptysis or dyspnea.  Denies chest pain, palpitations, dizziness, diaphoresis, PND, orthopnea or pitting edema of the lower extremities.  Denies abdominal pain, diarrhea, constipation, melena or hematochezia.  No dysuria, frequency or hematuria.  Denies blurred vision, polyuria, polydipsia, polyphagia.  ED Course: Initial vital signs were temperature 98.3 F, pulse 67, respiration 18, blood pressure 125/74 mmHg and O2 sat 97% on room air.  He was given vancomycin in the emergency department.  His white count is 11.8, hemoglobin 11.7 g/dL and platelets 324.  BMP shows a sodium 133 mmol/L, which normalizes once corrected to glucose.  The rest of the electrolytes are within normal limits.  Glucose 181, creatinine 1.21 and BUN 37 mg/dL.  Review of Systems: As per HPI otherwise 10 point review of systems negative.   Past Medical History:  Diagnosis Date  . Diabetes mellitus   . High triglycerides   . Hypertension   . Renal disorder   . Stroke [facility]) 2008   left hemiparesis    Past Surgical History:  Procedure Laterality Date  . CYSTOSCOPY WITH RETROGRADE PYELOGRAM, URETEROSCOPY AND STENT PLACEMENT  Right 11/19/2016   Procedure: CYSTOSCOPY WITH RETROGRADE PYELOGRAM, URETEROSCOPY AND STENT EXCHANGE;  Surgeon: Sebastian Ache, MD;  Location: [facility];  Service: Urology;  Laterality: Right;  . CYSTOSCOPY WITH STENT PLACEMENT Right 10/19/2016   Procedure: CYSTOSCOPY WITH STENT PLACEMENT;  Surgeon: Sebastian Ache, MD;  Location: [facility];  Service: Urology;  Laterality: Right;  . HERNIA REPAIR     bilateral inguinal  . HOLMIUM LASER APPLICATION Right 11/19/2016   Procedure: HOLMIUM LASER APPLICATION;  Surgeon: Sebastian Ache, MD;  Location: [facility];  Service: Urology;  Laterality: Right;  . TOE AMPUTATION    . TONSILLECTOMY       reports that he has never smoked. He has never used smokeless tobacco. He reports that he does not drink alcohol or use drugs.  No Known Allergies  Family History  Problem Relation Age of Onset  . Multiple myeloma Mother 30  . Colon cancer Neg Hx    Prior to Admission medications   Medication Sig Start Date End Date Taking? Authorizing Provider  amLODipine (NORVASC) 2.5 MG tablet Take 1 tablet (2.5 mg total) by mouth daily. 10/27/16  Yes Regalado, Belkys A, MD  apixaban (ELIQUIS) 5 MG TABS tablet Take 5 mg by mouth 2 (two) times daily.   Yes [provider]  atorvastatin (LIPITOR) 20 MG tablet Take 20 mg by mouth every morning.    Yes [provider]  b complex vitamins tablet Take 1 tablet by mouth 2 (two) times daily.   Yes [provider]  bacitracin ointment Apply 1 application topically 2 (two) times daily.  12/30/18  Yes Eber Hong, MD  glipiZIDE (GLUCOTROL) 5 MG tablet Take 5 mg by mouth 2 (two) times daily before a meal.    Yes [provider]  Lactobacillus (ACIDOPHILUS) 0.5 MG TABS Take 2 tablets by mouth 2 (two) times daily.    Yes [provider]  loperamide (LOPERAMIDE A-D) 2 MG tablet Take 2 mg by mouth daily as needed for diarrhea or loose stools.    Yes [provider]  losartan (COZAAR) 100 MG  tablet Take 100 mg by mouth daily. 10/12/16  Yes [provider]  Melatonin 1 MG TABS Take 3 mg by mouth at bedtime. HOLD WHILE IN HOSPITAL    Yes [provider]  metFORMIN (GLUCOPHAGE) 1000 MG tablet Take 1,000 mg by mouth 2 (two) times daily with a meal.   Yes [provider]  metoprolol succinate (TOPROL-XL) 12.5 mg TB24 24 hr tablet Take 12.5 mg by mouth daily.   Yes [provider]    Physical Exam: Vitals:   01/17/19 1741 01/17/19 1743 01/17/19 2200  BP: 125/74  (!) 107/52  Pulse: 67  71  Resp: 18  17  Temp: 98.3 F (36.8 C)    TempSrc: Oral    SpO2: 97%  100%  Weight:  72.6 kg   Height:  5\' 10"  (1.778 m)     Constitutional: NAD, calm, comfortable Eyes: PERRL, lids and conjunctivae normal ENMT: Mucous membranes are moist. Posterior pharynx clear of any exudate or lesions. Neck: normal, supple, no masses, no thyromegaly Respiratory: Clear to auscultation bilaterally, no wheezing, no crackles. Normal respiratory effort. No accessory muscle use.  Cardiovascular: Regular rate and rhythm, no murmurs / rubs / gallops. No extremity edema. 2+ pedal pulses. No carotid bruits.  Abdomen: Soft, no tenderness, no masses palpated. No hepatosplenomegaly. Bowel sounds positive.  Musculoskeletal: no clubbing / cyanosis. Good ROM, no contractures. Normal muscle tone.  Skin: Right great toe shows paronychial postal development is extending medially with surrounding edema and erythema that extends to the RLE pretibial area lower tear.  Please see pictures below. Neurologic: CN 2-12 grossly intact.  Subjective decrease in sensation on both feet, DTR normal.  4/5 left-sided hemiparesis.  Psychiatric: Normal judgment and insight. Alert and oriented x 3. Normal mood.         Labs on Admission: I have personally reviewed following labs and imaging studies  CBC: Recent Labs  Lab 01/17/19 2057  WBC 11.8*  NEUTROABS 7.5  HGB 11.7*  HCT 36.0*  MCV 89.8   PLT 364   Basic Metabolic Panel: Recent Labs  Lab 01/17/19 2057  NA 133*  K 4.4  CL 102  CO2 22  GLUCOSE 181*  BUN 37*  CREATININE 1.21  CALCIUM 8.9   GFR: Estimated Creatinine Clearance: 57.5 mL/min (by C-G formula based on SCr of 1.21 mg/dL). Liver Function Tests: No results for input(s): AST, ALT, ALKPHOS, BILITOT, PROT, ALBUMIN in the last 168 hours. No results for input(s): LIPASE, AMYLASE in the last 168 hours. No results for input(s): AMMONIA in the last 168 hours. Coagulation Profile: No results for input(s): INR, PROTIME in the last 168 hours. Cardiac Enzymes: No results for input(s): CKTOTAL, CKMB, CKMBINDEX, TROPONINI in the last 168 hours. BNP (last 3 results) No results for input(s): PROBNP in the last 8760 hours. HbA1C: No results for input(s): HGBA1C in the last 72 hours. CBG: No results for input(s): GLUCAP in the last 168 hours. Lipid Profile: No results for input(s): CHOL, HDL, LDLCALC,  TRIG, CHOLHDL, LDLDIRECT in the last 72 hours. Thyroid Function Tests: No results for input(s): TSH, T4TOTAL, FREET4, T3FREE, THYROIDAB in the last 72 hours. Anemia Panel: No results for input(s): VITAMINB12, FOLATE, FERRITIN, TIBC, IRON, RETICCTPCT in the last 72 hours. Urine analysis:    Component Value Date/Time   COLORURINE YELLOW 10/19/2016 1729   APPEARANCEUR CLOUDY (A) 10/19/2016 1729   LABSPEC 1.021 10/19/2016 1729   PHURINE 7.0 10/19/2016 1729   GLUCOSEU 150 (A) 10/19/2016 1729   HGBUR SMALL (A) 10/19/2016 1729   BILIRUBINUR NEGATIVE 10/19/2016 1729   KETONESUR 5 (A) 10/19/2016 1729   PROTEINUR 100 (A) 10/19/2016 1729   NITRITE NEGATIVE 10/19/2016 1729   LEUKOCYTESUR MODERATE (A) 10/19/2016 1729    Radiological Exams on Admission: Dg Foot Complete Right  Result Date: 01/17/2019 CLINICAL DATA:  Swelling and drainage from right great toe. History of diabetes. EXAM: RIGHT FOOT COMPLETE - 3+ VIEW COMPARISON:  Radiographs 12/30/2018 FINDINGS: No acute  fracture or traumatic malalignment. There is soft tissue swelling and ulceration at the tip of the first digit. No subcutaneous gas few punctate radiodensities are present within the soft tissues which are new from prior exam and could reflect debris or foreign body. No bony erosion, destructive change or immature periostitis to suggest early radiographic features of osteomyelitis. 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 Hammertoe deformities of the third and fourth digits. May mild mid foot arthrosis is present as well. Please note alignment of the mid and hindfoot is incompletely assessed on nonweightbearing radiographs. Calcaneal spurs are present at the Achilles and plantar ligament insertions. IMPRESSION: 1. Soft tissue swelling and ulceration at the tip of the first digit. Few punctate radiodensities within the soft tissues are new from prior exam and could reflect debris or foreign body and should be correlated with visual inspection. 2. No radiographic evidence of osteomyelitis. MRI is more sensitive and specific for early findings of osteomyelitis. Electronically Signed   By: Kreg Shropshire M.D.   On: 01/17/2019 21:41   10/23/2016 echocardiogram ------------------------------------------------------------------- LV EF: 65% -   70%  ------------------------------------------------------------------- Indications:      Bacteremia 790.7.  ------------------------------------------------------------------- History:   Risk factors:  History of stroke. Hypertension. Diabetes mellitus. Dyslipidemia.  ------------------------------------------------------------------- Study Conclusions  - Left ventricle: The cavity size was normal. Wall thickness was   increased in a pattern of moderate LVH. Systolic function was   vigorous. The estimated ejection fraction was in the range of 65%   to 70%. Wall motion was normal; there were no regional wall   motion abnormalities.  Doppler parameters are consistent with   abnormal left ventricular relaxation (grade 1 diastolic   dysfunction). - Left atrium: The atrium was mildly to moderately dilated.  Impressions:  - There was no evidence of a vegetation.  EKG: Independently reviewed.  Assessment/Plan Principal Problem:   Diabetic foot ulcer (HCC) Admit to MedSurg/inpatient. Continue wound local care. Check ESR, CRP, prealbumin Continue vancomycin. Start ceftriaxone 1 g IVPB every 24 hours. Metronidazole 500 mg p.o. 3 times daily. Check MRI of foot to rule out osteomyelitis. Follow-up blood culture and sensitivity.  Active Problems:   Type 2 diabetes mellitus (HCC) Carbohydrate modified diet. Continue glipizide 5 mg p.o. twice daily. Continue metformin 1000 mg p.o. twice daily. CBG monitoring with RI SS. Check hemoglobin A1c.    HTN (hypertension) Continue amlodipine 2.5 mg p.o. daily. Continue losartan 100 mg p.o. daily. Continue metoprolol 12.5 mg p.o. daily. Monitor BP, HR, renal function electrolytes.    Hyperlipidemia  Continue atorvastatin 20 mg p.o. daily. Monitor LFTs.    Anemia Monitor H&H.   DVT prophylaxis: On apixaban. Code Status: Full code Family Communication: Disposition Plan: Admit for IV antibiotic therapy and further work-up. Consults called: Admission status: Inpatient/MedSurg.   Bobette Mo MD [facility]  If 7PM-7AM, please contact night-coverage [email or web address]  01/17/2019, 11:19 PM    This document was prepared using Dragon voice recognition software and may contain some unintended transcription errors.

## 2019-01-17 NOTE — Progress Notes (Signed)
Pharmacy Antibiotic Note  Wesley Ingram is a 72 y.o. male admitted on 01/17/2019 with diabetic foot infection.  Pharmacy has been consulted for vancomycin dosing.  Plan: Vancomycin 750mg  IV every 12 hours.  Goal trough 15-20 mcg/mL.  Height: 5\' 10"  (177.8 cm) Weight: 160 lb (72.6 kg) IBW/kg (Calculated) : 73  Temp (24hrs), Avg:98.3 F (36.8 C), Min:98.3 F (36.8 C), Max:98.3 F (36.8 C)  Recent Labs  Lab 01/17/19 2057  WBC 11.8*  CREATININE 1.21    Estimated Creatinine Clearance: 57.5 mL/min (by C-G formula based on SCr of 1.21 mg/dL).    No Known Allergies  Antimicrobials this admission: 8/5 vanco >>  8/5 ceftriaxone >>  8/5 metronidazole >>  Microbiology results: 8/5 BCx: sent 8/5 Covid 19: sent    Thank you for allowing pharmacy to be a part of this patient's care.  Donna Christen Aileana Hodder 01/17/2019 10:35 PM

## 2019-01-17 NOTE — ED Provider Notes (Signed)
[facility] EMERGENCY DEPARTMENT Provider Note   CSN: [id number] Arrival date & time: 01/17/19  1644  History   Chief Complaint Chief Complaint  Patient presents with   Foot Pain   HPI Wesley Ingram is a 73 y.o. male with past medical history significant for diabetes, hypertension, CVA, gangrene who presents for evaluation of toe pain.  Patient was seen in emergency department on 12/30/2018.  Had ulcer to the plantar surface of his great toe at that time on right foot.  Was given 10-day course of doxycycline.  Was followed up by PCP and has had wound care coming out to evaluate his wound.  Patient and wife state that he has noted drainage, increased swelling, redness and warmth which is now going up his leg.  Pleated course of doxycycline approximately 1 week ago.  Denies fever, chills, nausea, vomiting, shortness of breath, abdominal pain.  Has had intermittent nonbloody diarrhea.  One episode today without associated abdominal pain.  No recent sick contacts or COVID exposures. 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  Surgery performed at the [facility].  Patient at that time did have sepsis and was hospitalized times months according to wife.  Rates his current pain a 3/10.  Denies radiation.  Denies additional aggravating or alleviating factors.  History obtained from patient and family in room.  No interpreter was used.     HPI  Past Medical History:  Diagnosis Date   Diabetes mellitus    High triglycerides    Hypertension    Renal disorder    Stroke [facility]) 2008   left hemiparesis    Patient Active Problem List   Diagnosis Date Noted   Diarrhea 01/16/2019   Ileus ([place]) 10/19/2016   History of CVA (cerebrovascular accident) 10/19/2016   Nephrolithiasis 10/19/2016   Lytic bone lesions on xray 10/19/2016   Acute renal failure (ARF) ([place]) 10/19/2016   Chronic anticoagulation 10/19/2016   TIA (transient ischemic attack)  02/25/2011   Wound cellulitis 02/25/2011   CVA (cerebral vascular accident) ([place]) 02/23/2011   DM (diabetes mellitus) ([place]) 02/23/2011   HTN (hypertension) 02/23/2011   Hyperlipidemia 02/23/2011    Past Surgical History:  Procedure Laterality Date   CYSTOSCOPY WITH RETROGRADE PYELOGRAM, URETEROSCOPY AND STENT PLACEMENT Right 11/19/2016   Procedure: CYSTOSCOPY WITH RETROGRADE PYELOGRAM, URETEROSCOPY AND STENT EXCHANGE;  Surgeon: Alexis Frock, MD;  Location: [facility];  Service: Urology;  Laterality: Right;   CYSTOSCOPY WITH STENT PLACEMENT Right 10/19/2016   Procedure: CYSTOSCOPY WITH STENT PLACEMENT;  Surgeon: Alexis Frock, MD;  Location: [facility];  Service: Urology;  Laterality: Right;   HERNIA REPAIR     bilateral inguinal   HOLMIUM LASER APPLICATION Right 09/19/2705   Procedure: HOLMIUM LASER APPLICATION;  Surgeon: Alexis Frock, MD;  Location: [facility];  Service: Urology;  Laterality: Right;   TOE AMPUTATION     TONSILLECTOMY          Home Medications    Prior to Admission medications   Medication Sig Start Date End Date Taking? Authorizing Provider  amLODipine (NORVASC) 2.5 MG tablet Take 1 tablet (2.5 mg total) by mouth daily. 10/27/16  Yes Regalado, Belkys A, MD  apixaban (ELIQUIS) 5 MG TABS tablet Take 5 mg by mouth 2 (two) times daily.   Yes [provider]  atorvastatin (LIPITOR) 20 MG tablet Take 20 mg by mouth every morning.    Yes [provider]  b complex vitamins tablet Take 1 tablet by  mouth 2 (two) times daily.   Yes [provider]  bacitracin ointment Apply 1 application topically 2 (two) times daily. 12/30/18  Yes Noemi Chapel, MD  glipiZIDE (GLUCOTROL) 5 MG tablet Take 5 mg by mouth 2 (two) times daily before a meal.    Yes [provider]  Lactobacillus (ACIDOPHILUS) 0.5 MG TABS Take 2 tablets by mouth 2 (two) times daily.    Yes [provider]  loperamide (LOPERAMIDE A-D) 2 MG tablet Take 2 mg by mouth  daily as needed for diarrhea or loose stools.    Yes [provider]  losartan (COZAAR) 100 MG tablet Take 100 mg by mouth daily. 10/12/16  Yes [provider]  Melatonin 1 MG TABS Take 3 mg by mouth at bedtime. [place]    Yes [provider]  metFORMIN (GLUCOPHAGE) 1000 MG tablet Take 1,000 mg by mouth 2 (two) times daily with a meal.   Yes [provider]  metoprolol succinate (TOPROL-XL) 12.5 mg TB24 24 hr tablet Take 12.5 mg by mouth daily.   Yes [provider]    Family History Family History  Problem Relation Age of Onset   Multiple myeloma Mother 29   Colon cancer Neg Hx     Social History Social History   Tobacco Use   Smoking status: Never Smoker   Smokeless tobacco: Never Used  Substance Use Topics   Alcohol use: No   Drug use: No     Allergies   Patient has no known allergies.   Review of Systems Review of Systems  Constitutional: Negative.   HENT: Negative.   Respiratory: Negative.   Cardiovascular: Negative.   Gastrointestinal: Negative.   Genitourinary: Negative.   Musculoskeletal:       Right great toe pain.  Skin: Positive for wound.  Neurological: Negative.   All other systems reviewed and are negative.  Physical Exam Updated Vital Signs BP (!) 107/52    Pulse 71    Temp 98.3 F (36.8 C) (Oral)    Resp 17    Ht _0  (1.778 m)    Wt 72.6 kg    SpO2 100%    BMI 22.96 kg/m   Physical Exam Vitals signs and nursing note reviewed.  Constitutional:      General: He is not in acute distress.    Appearance: He is well-developed. He is not ill-appearing, toxic-appearing or diaphoretic.  HENT:     Head: Atraumatic.     Nose: Nose normal.     Mouth/Throat:     Mouth: Mucous membranes are moist.     Pharynx: Oropharynx is clear.  Eyes:     Pupils: Pupils are equal, round, and reactive to light.  Neck:     Musculoskeletal: Normal range of motion and neck supple.  Cardiovascular:      Rate and Rhythm: Normal rate and regular rhythm.     Pulses: Normal pulses.     Heart sounds: Normal heart sounds.  Pulmonary:     Effort: Pulmonary effort is normal. No respiratory distress.     Breath sounds: Normal breath sounds.  Abdominal:     General: Bowel sounds are normal. There is no distension.     Palpations: Abdomen is soft.  Musculoskeletal: Normal range of motion.     Comments: 3rd digit missing on left lower extremity.  Right lower extremity with erythema, swelling to distal/mid shin.  Patient with malodorous, oozing great toe wound.  Able to plantarflex  and dorsiflex without difficulty.  No tenderness to posterior calf.  Skin:    General: Skin is warm and dry.  Neurological:     Mental Status: He is alert.            ED Treatments / Results  Labs (all labs ordered are listed, but only abnormal results are displayed) Labs Reviewed  CBC WITH DIFFERENTIAL/PLATELET - Abnormal; Notable for the following components:      Result Value   WBC 11.8 (*)    RBC 4.01 (*)    Hemoglobin 11.7 (*)    HCT 36.0 (*)    Monocytes Absolute 1.4 (*)    Abs Immature Granulocytes 0.21 (*)    All other components within normal limits  BASIC METABOLIC PANEL - Abnormal; Notable for the following components:   Sodium 133 (*)    Glucose, Bld 181 (*)    BUN 37 (*)    GFR calc non Af Amer 60 (*)    All other components within normal limits  CULTURE, BLOOD (ROUTINE X 2)  CULTURE, BLOOD (ROUTINE X 2)  SARS CORONAVIRUS 2    EKG None  Radiology Dg Foot Complete Right  Result Date: 01/17/2019 CLINICAL DATA:  Swelling and drainage from right great toe. History of diabetes. EXAM: RIGHT FOOT COMPLETE - 3+ VIEW COMPARISON:  Radiographs 12/30/2018 FINDINGS: No acute fracture or traumatic malalignment. There is soft tissue swelling and ulceration at the tip of the first digit. No subcutaneous gas few punctate radiodensities are present within the soft tissues which are new from prior exam  and could reflect debris or foreign body. No bony erosion, destructive change or immature periostitis to suggest early radiographic features of osteomyelitis. 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 Hammertoe deformities of the third and fourth digits. May mild mid foot arthrosis is present as well. Please note alignment of the mid and hindfoot is incompletely assessed on nonweightbearing radiographs. Calcaneal spurs are present at the Achilles and plantar ligament insertions. IMPRESSION: 1. Soft tissue swelling and ulceration at the tip of the first digit. Few punctate radiodensities within the soft tissues are new from prior exam and could reflect debris or foreign body and should be correlated with visual inspection. 2. No radiographic evidence of osteomyelitis. MRI is more sensitive and specific for early findings of osteomyelitis. Electronically Signed   By: Lovena Le M.D.   On: 01/17/2019 21:41    Procedures Procedures (including critical care time)  Medications Ordered in ED Medications  vancomycin (VANCOCIN) IVPB 1000 mg/200 mL premix (has no administration in time range)  sodium chloride 0.9 % bolus 500 mL (0 mLs Intravenous Stopped 01/17/19 2154)     Initial Impression / Assessment and Plan / ED Course  I have reviewed the triage vital signs and the nursing notes.  Pertinent labs & imaging results that were available during my care of the patient were reviewed by me and considered in my medical decision making (see chart for details).  73 year old male presents for evaluation of right great toe pain.  Patient with blister and was seen emergency department approximately 1 month ago.  Did 10-day course of doxycycline and has had worsening to his wound.  Patient now with malodorous, draining wound to right great toe with erythema, swelling and warmth to distal/mid shin on right lower extremity.  Neurovascularly intact.  2+ DP pulses bilaterally.  Patient with  history significant for gangrene requiring amputation to left toe.  No systemic symptoms.  Has been seen at  home by wound nurse practitioner was noted worsening wound.   Labs and imaging personally reviewed CBC with leukocytosis at 63.3 Metabolic panel mild hyponatremia at 133, given IV fluids, hyperglycemia to 181 Blood cultures obtained COVID pending Plain film right foot negative for fracture, dislocation, gas-forming organism or gross osteomyelitis however cannot rule out early osteomyelitis.  Soft tissue swelling to right foot.  Discussed risk versus benefit of inpatient management with patient and family in room.  Does not have good PCP follow-up.  Feel patient benefit from IV antibiotics given failed outpatient therapy of doxycycline.  May benefit from MRI in morning to rule out early osteomyelitis. Started on Vanc and given fluids. BC obtain however patient appears overall well and non toxic.  Low suspicion for DVT as cause of his redness and swelling however would need ultrasound to rule this out in the morning.  No chest pain or shortness of breath to suggest PE.  Consulted with Dr. Olevia Bowens, [facility] who will evaluate patient in ED for admission.      The patient appears reasonably stabilized for admission considering the current resources, flow, and capabilities available in the ED at this time, and I doubt any other [facility] requiring further screening and/or treatment in the ED prior to admission. Final Clinical Impressions(s) / ED Diagnoses   Final diagnoses:  Cellulitis of right lower extremity    ED Discharge Orders    None       Sivan Quast A, PA-C 01/17/19 2223    Milton Ferguson, MD 01/19/19 [phone]

## 2019-01-18 ENCOUNTER — Inpatient Hospital Stay (HOSPITAL_COMMUNITY): Payer: Medicare Other

## 2019-01-18 ENCOUNTER — Other Ambulatory Visit: Payer: Self-pay

## 2019-01-18 ENCOUNTER — Telehealth: Payer: Self-pay | Admitting: Internal Medicine

## 2019-01-18 DIAGNOSIS — I10 Essential (primary) hypertension: Secondary | ICD-10-CM

## 2019-01-18 LAB — CBC WITH DIFFERENTIAL/PLATELET
Abs Immature Granulocytes: 0.15 10*3/uL — ABNORMAL HIGH (ref 0.00–0.07)
Basophils Absolute: 0.1 10*3/uL (ref 0.0–0.1)
Basophils Relative: 1 %
Eosinophils Absolute: 0.4 10*3/uL (ref 0.0–0.5)
Eosinophils Relative: 4 %
HCT: 33.1 % — ABNORMAL LOW (ref 39.0–52.0)
Hemoglobin: 10.7 g/dL — ABNORMAL LOW (ref 13.0–17.0)
Immature Granulocytes: 1 %
Lymphocytes Relative: 19 %
Lymphs Abs: 2 10*3/uL (ref 0.7–4.0)
MCH: 29.5 pg (ref 26.0–34.0)
MCHC: 32.3 g/dL (ref 30.0–36.0)
MCV: 91.2 fL (ref 80.0–100.0)
Monocytes Absolute: 1.4 10*3/uL — ABNORMAL HIGH (ref 0.1–1.0)
Monocytes Relative: 13 %
Neutro Abs: 6.8 10*3/uL (ref 1.7–7.7)
Neutrophils Relative %: 62 %
Platelets: 308 10*3/uL (ref 150–400)
RBC: 3.63 MIL/uL — ABNORMAL LOW (ref 4.22–5.81)
RDW: 12.9 % (ref 11.5–15.5)
WBC: 10.9 10*3/uL — ABNORMAL HIGH (ref 4.0–10.5)
nRBC: 0 % (ref 0.0–0.2)

## 2019-01-18 LAB — GLUCOSE, CAPILLARY: Glucose-Capillary: 109 mg/dL — ABNORMAL HIGH (ref 70–99)

## 2019-01-18 LAB — SEDIMENTATION RATE: Sed Rate: 52 mm/hr — ABNORMAL HIGH (ref 0–16)

## 2019-01-18 LAB — CBG MONITORING, ED
Glucose-Capillary: 106 mg/dL — ABNORMAL HIGH (ref 70–99)
Glucose-Capillary: 118 mg/dL — ABNORMAL HIGH (ref 70–99)
Glucose-Capillary: 196 mg/dL — ABNORMAL HIGH (ref 70–99)

## 2019-01-18 LAB — HEMOGLOBIN A1C
Hgb A1c MFr Bld: 6.3 % — ABNORMAL HIGH (ref 4.8–5.6)
Mean Plasma Glucose: 134.11 mg/dL

## 2019-01-18 LAB — SARS CORONAVIRUS 2 (TAT 6-24 HRS): SARS Coronavirus 2: NEGATIVE

## 2019-01-18 LAB — PREALBUMIN: Prealbumin: 21.4 mg/dL (ref 18–38)

## 2019-01-18 MED ORDER — JUVEN PO PACK
1.0000 | PACK | Freq: Two times a day (BID) | ORAL | Status: DC
  Administered 2019-01-18 – 2019-01-29 (×20): 1 via ORAL
  Filled 2019-01-18 (×26): qty 1

## 2019-01-18 MED ORDER — LORAZEPAM 0.5 MG PO TABS
0.5000 mg | ORAL_TABLET | Freq: Two times a day (BID) | ORAL | Status: AC | PRN
  Administered 2019-01-18 – 2019-01-19 (×2): 0.5 mg via ORAL
  Filled 2019-01-18 (×2): qty 1

## 2019-01-18 MED ORDER — VANCOMYCIN HCL IN DEXTROSE 750-5 MG/150ML-% IV SOLN
750.0000 mg | Freq: Two times a day (BID) | INTRAVENOUS | Status: DC
  Administered 2019-01-18 – 2019-01-23 (×11): 750 mg via INTRAVENOUS
  Filled 2019-01-18 (×14): qty 150

## 2019-01-18 MED ORDER — PRO-STAT SUGAR FREE PO LIQD
30.0000 mL | Freq: Two times a day (BID) | ORAL | Status: DC
  Administered 2019-01-18 – 2019-01-29 (×22): 30 mL via ORAL
  Filled 2019-01-18 (×24): qty 30

## 2019-01-18 NOTE — Progress Notes (Signed)
Inpatient Diabetes Program Recommendations  AACE/ADA: New Consensus Statement on Inpatient Glycemic Control (2015)  Target Ranges:  Prepandial:   less than 140 mg/dL      Peak postprandial:   less than 180 mg/dL (1-2 hours)      Critically ill patients:  140 - 180 mg/dL   Lab Results  Component Value Date   GLUCAP 106 (H) 01/18/2019   HGBA1C 6.6 (H) 10/19/2016    Review of Glycemic Control  Diabetes history: DM2 Outpatient Diabetes medications: Glucotrol 5 mg bid + Metformin 1 gm bid Current orders for Inpatient glycemic control: Glucotrol 5 mg bid + Metformin 1 gm bid + Novolog moderate tid  Inpatient Diabetes Program Recommendations:   Received consult regarding lower extremity wound. Noted A1c pending. Will follow if admitted into hospital.  Thank you, Nani Gasser. Fontaine Kossman, RN, MSN, CDE  Diabetes Coordinator Inpatient Glycemic Control Team Team Pager [phone] (8am-5pm) 01/18/2019 10:35 AM

## 2019-01-18 NOTE — Telephone Encounter (Signed)
Lmom, waiting for a return call.

## 2019-01-18 NOTE — Progress Notes (Signed)
Initial Nutrition Assessment  DOCUMENTATION CODES:      INTERVENTION:  -1 packet Juven BID, each packet provides 95 calories, 2.5 grams of protein (collagen), and 9.8 grams of carbohydrate (3 grams sugar); also contains 7 grams of L-arginine and L-glutamine, 300 mg vitamin C, 15 mg vitamin E, 1.2 mcg vitamin B-12, 9.5 mg zinc, 200 mg calcium  -ProStat 30 ml BID (each 30 ml provides 100 kcal, 15 gr protein)    NUTRITION DIAGNOSIS:   Increased nutrient needs related to wound healing as evidenced by estimated needs.  GOAL:  Patient will meet greater than or equal to 90% of their needs  MONITOR: - PO intake, Supplement acceptance  REASON FOR ASSESSMENT:  Consult Wound healing  ASSESSMENT: Patient is a 73 yo male with history of stroke, DM (A1C-pending), Renal disorder. Patient presents with foot pain- infection-soft tissue swelling right great toe.   Patient lives with his spouse and denies changes in appetite. His wife prepares meals for them daily. Breakfast is often oatmeal, eggs. His favorite lunch is a hamburger and in the evening she cooks a meal protein and vegetables which varies. Patient says he has been taking B-12 at home due to increased weakness. Talked with him about good protein sources and the importance of consistent intake. He is agreeable to take protein modular    Medications reviewed. Patient weight has been ranging between 72-75 kg.  BMI is low normal for age.   Labs: BMP Latest Ref Rng & Units 01/17/2019 12/30/2018 12/16/2017  Glucose 70 - 99 mg/dL 181(H) 194(H) 85  BUN 8 - 23 mg/dL 37(H) 31(H) 22  Creatinine 0.61 - 1.24 mg/dL 1.21 1.35(H) 1.06  Sodium 135 - 145 mmol/L 133(L) 134(L) 140  Potassium 3.5 - 5.1 mmol/L 4.4 4.3 4.2  Chloride 98 - 111 mmol/L 102 104 105  CO2 22 - 32 mmol/L 22 21(L) 24  Calcium 8.9 - 10.3 mg/dL 8.9 9.0 9.6     NUTRITION - FOCUSED PHYSICAL EXAM: mild and moderate muscle and fat loss orbital, buccal, patellar, anterior thigh. Edema and  redness  to right lower foot and ankle.    Diet Order:   Diet Order            Diet heart healthy/carb modified Room service appropriate? Yes; Fluid consistency: Thin  Diet effective now              EDUCATION NEEDS:   Education needs have been addressed   Skin:    Patient presents with foot pain- infection-soft tissue swelling right great toe. See WOC note for details.  Last BM:  Unknown  Height:   Ht Readings from Last 1 Encounters:  01/17/19 5\' 10"  (1.778 m)    Weight:   Wt Readings from Last 1 Encounters:  01/17/19 72.6 kg    Ideal Body Weight:  75 kg  BMI:  Body mass index is 22.96 kg/m.  Estimated Nutritional Needs:   Kcal:  7494-4967  Protein:  94-102 gr  Fluid:  >2 liters daily   Colman Cater MS,RD,CSG,LDN Office: #[phone] Pager: [phone]

## 2019-01-18 NOTE — Progress Notes (Signed)
Patient Demographics:    Wesley Ingram, is a 73 y.o. 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 -Surgical consult requested Trend ESR, CRP,  Follow-up blood culture and sensitivity.     Type 2 diabetes mellitus (HCC) -A1c 6.3 reflecting excellent diabetic control Carbohydrate modified diet. Continue glipizide 5 mg p.o. twice daily. Hold metformin-given possible need for contrast studies and vancomycin use with risk for nephrotoxicityCBG monitoring with RI SS.     HTN (hypertension) -Stable, Continue amlodipine 2.5 mg p.o. daily. Hold losartan --given possible need for contrast studies and vancomycin use with risk for nephrotoxicity Continue metoprolol 12.5 mg p.o. daily. Monitor BP, HR, renal function electrolytes.    Hyperlipidemia Continue atorvastatin 20 mg p.o. daily.    Chronic Anemia Monitor H&H.  --History of prior CVA/chronic anticoagulation -Continue Eliquis  Social/Ethics--CODE STATUS discussed with patient again, he is a full code  DVT prophylaxis: On apixaban. Code Status: Full code Family Communication: Disposition Plan:  TBD   Disposition/Need for in-Hospital Stay- patient unable to be discharged at  this time due to need for IV antibiotics for osteomyelitis may need surgical intervention  Code Status : full  Family Communication:   NA (patient is alert, awake and coherent)   Disposition Plan  : TBD  Consults  :  gen surg  DVT Prophylaxis  : Eliquis Lab Results  Component Value Date   PLT 308 01/18/2019    Inpatient Medications  Scheduled Meds:  amLODipine  2.5 mg Oral Daily   apixaban  5 mg Oral BID   atorvastatin  20 mg Oral q morning - 10a   feeding supplement (PRO-STAT SUGAR FREE 64)  30 mL Oral BID   glipiZIDE  5 mg Oral BID AC   insulin aspart  0-15 Units Subcutaneous TID WC   metoprolol succinate  12.5 mg Oral Daily   metroNIDAZOLE  500 mg Oral Q8H   nutrition supplement (JUVEN)  1 packet Oral BID BM   Continuous Infusions:  cefTRIAXone (ROCEPHIN)  IV Stopped (01/18/19 0331)   vancomycin 750 mg (01/18/19 1021)   PRN Meds:.acetaminophen **OR** acetaminophen, loperamide, LORazepam    Anti-infectives (From admission, onward)   Start     Dose/Rate Route Frequency Ordered Stop   01/18/19 1000  vancomycin (VANCOCIN) IVPB 750 mg/150 ml premix     750 mg 150 mL/hr over 60 Minutes Intravenous Every 12 hours 01/18/19 0847     01/18/19 0800  vancomycin (VANCOCIN) IVPB 750 mg/150 ml premix  Status:  Discontinued     750 mg 150 mL/hr over 60 Minutes Intravenous Every 12 hours 01/17/19 2235 01/18/19 0847   01/17/19 2230  cefTRIAXone (ROCEPHIN) 2 g in sodium chloride 0.9 % 100 mL IVPB     2 g 200 mL/hr over 30 Minutes Intravenous Every 24 hours 01/17/19 2224     01/17/19 2230  metroNIDAZOLE (FLAGYL) tablet 500 mg     500 mg Oral Every 8 hours 01/17/19 2224     01/17/19 2215  vancomycin (VANCOCIN) IVPB 1000 mg/200 mL premix     1,000 mg 200 mL/hr over 60 Minutes Intravenous  Once 01/17/19 2208 01/17/19 2340        Objective:   Vitals:   01/18/19 0619 01/18/19 0630 01/18/19 1600 01/18/19 1611  BP: (!) 145/68 128/82  (!) 155/72  Pulse: 78 86  73   Resp: 17   16  Temp: 97.8 F (36.6 C)   98.5 F (36.9 C)  TempSrc: Oral   Oral  SpO2: 97% 98%  99%  Weight:   73.6 kg   Height:        Wt Readings from Last 3 Encounters:  01/18/19 73.6 kg  01/16/19 72.6 kg  12/30/18 74.8 kg     Intake/Output Summary (Last 24 hours) at 01/18/2019 1852 Last data filed at 01/18/2019 1844 Gross per 24 hour  Intake 100 ml  Output 700 ml  Net -600 ml     Physical Exam  Gen:- Awake Alert,  In no apparent distress  HEENT:- [place].AT, No sclera icterus Neck-Supple Neck,No JVD,.  Lungs-  CTAB , fair symmetrical air movement CV- S1, S2 normal, regular  Abd-  +ve B.Sounds, Abd Soft, No tenderness,    Extremity--pedal pulses present  Psych-affect is appropriate, oriented x3 Neuro-no new focal deficits, no tremors MSK--right great toe findings as noted in photos in epic  Data Review:   Micro Results Recent Results (from the past 240 hour(s))  Blood culture (routine x 2)     Status: None (Preliminary result)   Collection Time: 01/17/19  8:48 PM   Specimen: BLOOD RIGHT ARM  Result Value Ref Range Status   Specimen Description BLOOD RIGHT ARM  Final   Special Requests   Final    BOTTLES DRAWN AEROBIC AND ANAEROBIC Blood Culture adequate volume   Culture   Final    NO GROWTH < 12 HOURS Performed at [facility], [address] [place], [place] [address]    Report Status PENDING  Incomplete  Blood culture (routine x 2)     Status: None (Preliminary result)   Collection Time: 01/17/19  8:57 PM   Specimen: BLOOD RIGHT HAND  Result Value Ref Range Status   Specimen Description BLOOD RIGHT HAND  Final   Special Requests   Final    BOTTLES DRAWN AEROBIC AND ANAEROBIC Blood Culture adequate volume   Culture   Final    NO GROWTH < 12 HOURS Performed at [facility], [address], [place], [place] [address]    Report Status PENDING  Incomplete    Radiology Reports Mr Foot Right Wo Contrast  Result Date: 01/18/2019 CLINICAL DATA:  Diabetic  patient with an open wound on the right great toe. EXAM: MRI OF THE RIGHT FOREFOOT WITHOUT CONTRAST TECHNIQUE: Multiplanar, multisequence MR imaging of the right forefoot was performed. No intravenous contrast was administered. COMPARISON:  Plain films right foot 01/17/2019. FINDINGS: Bones/Joint/Cartilage Motion degrades the examination. There is marrow edema throughout the distal phalanx of  the great toe consistent with osteomyelitis. No other evidence of osteomyelitis is identified. Ligaments Intact. Muscles and Tendons Intact. No intramuscular fluid collection. 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 Soft tissues Skin wound at the distal aspect of the great toe is identified. No abscess is seen. IMPRESSION: Motion degraded examination demonstrates marrow edema throughout the distal phalanx of the great toe consistent with osteomyelitis. No abscess is identified. No evidence of septic joint. Electronically Signed   By: Drusilla Kanner M.D.   On: 01/18/2019 07:35   US Arterial Abi (screening Lower Extremity)  Result Date: 01/18/2019 CLINICAL DATA:  Right diabetic open foot wound, great toe. Previous amputation. EXAM: NONINVASIVE PHYSIOLOGIC VASCULAR STUDY OF BILATERAL LOWER EXTREMITIES TECHNIQUE: Evaluation of both lower extremities were performed at rest, including calculation of ankle-brachial indices with single level Doppler, pressure and pulse volume recording. COMPARISON:  None. FINDINGS: Right ABI:  1.28 Left ABI:  1.16 Right Lower Extremity:  Multiphasic waveform at the ankle. Left Lower Extremity:  Multiphasic waveform at the ankle. IMPRESSION: Normal bilateral ABIs. Vascular calcification and resultant limited compressibility in the setting of diabetes can result in elevated values however. Electronically Signed   By: Corlis Leak M.D.   On: 01/18/2019 09:50   Dg Foot Complete Right  Result Date: 01/17/2019 CLINICAL DATA:   Swelling and drainage from right great toe. History of diabetes. EXAM: RIGHT FOOT COMPLETE - 3+ VIEW COMPARISON:  Radiographs 12/30/2018 FINDINGS: No acute fracture or traumatic malalignment. There is soft tissue swelling and ulceration at the tip of the first digit. No subcutaneous gas few punctate radiodensities are present within the soft tissues which are new from prior exam and could reflect debris or foreign body. No bony erosion, destructive change or immature periostitis to suggest early radiographic features of osteomyelitis. 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 Hammertoe deformities of the third and fourth digits. May mild mid foot arthrosis is present as well. Please note alignment of the mid and hindfoot is incompletely assessed on nonweightbearing radiographs. Calcaneal spurs are present at the Achilles and plantar ligament insertions. IMPRESSION: 1. Soft tissue swelling and ulceration at the tip of the first digit. Few punctate radiodensities within the soft tissues are new from prior exam and could reflect debris or foreign body and should be correlated with visual inspection. 2. No radiographic evidence of osteomyelitis. MRI is more sensitive and specific for early findings of osteomyelitis. Electronically Signed   By: Kreg Shropshire M.D.   On: 01/17/2019 21:41   Dg Foot Complete Right  Result Date: 12/30/2018 CLINICAL DATA:  Infection, right great toe EXAM: RIGHT FOOT COMPLETE - 3+ VIEW COMPARISON:  None. FINDINGS: No fracture or dislocation of the right foot. No bony erosion or sclerosis of the great toe to suggest osteomyelitis. There is moderate first metatarsophalangeal arthrosis. Mild midfoot arthrosis. The remaining joint spaces are well preserved. Mild soft tissue edema of the great toe. IMPRESSION: No fracture or dislocation of the right foot. No bony erosion or sclerosis of the great toe to suggest osteomyelitis. There is moderate first metatarsophalangeal  arthrosis. Mild midfoot arthrosis. The remaining joint spaces are well preserved. Mild soft tissue edema of the great toe. MRI may be used to more sensitively evaluate for bone marrow edema and osteomyelitis if suspected. Electronically Signed   By: Lauralyn Primes M.D.   On: 12/30/2018 13:10     CBC Recent Labs  Lab 01/17/19 2057 01/18/19 0408  WBC  11.8* 10.9*  HGB 11.7* 10.7*  HCT 36.0* 33.1*  PLT 364 308  MCV 89.8 91.2  MCH 29.2 29.5  MCHC 32.5 32.3  RDW 13.0 12.9  LYMPHSABS 2.2 2.0  MONOABS 1.4* 1.4*  EOSABS 0.4 0.4  BASOSABS 0.1 0.1    Chemistries  Recent Labs  Lab 01/17/19 2057  NA 133*  K 4.4  CL 102  CO2 22  GLUCOSE 181*  BUN 37*  CREATININE 1.21  CALCIUM 8.9   ------------------------------------------------------------------------------------------------------------------ No results for input(s): CHOL, HDL, LDLCALC, TRIG, CHOLHDL, LDLDIRECT in the last 72 hours.  Lab Results  Component Value Date   HGBA1C 6.3 (H) 01/18/2019   ------------------------------------------------------------------------------------------------------------------ No results for input(s): TSH, T4TOTAL, T3FREE, THYROIDAB in the last 72 hours.  Invalid input(s): FREET3 ------------------------------------------------------------------------------------------------------------------ No results for input(s): VITAMINB12, FOLATE, FERRITIN, TIBC, IRON, RETICCTPCT in the last 72 hours.  Coagulation profile No results for input(s): INR, PROTIME in the last 168 hours.  No results for input(s): DDIMER in the last 72 hours.  Cardiac Enzymes No results for input(s): CKMB, TROPONINI, MYOGLOBIN in the last 168 hours.  Invalid input(s): CK ------------------------------------------------------------------------------------------------------------------ No results found for: BNP   Shon Hale M.D on 01/18/2019 at 6:52 PM  Go to [email or web address] - for contact info  [facility] -  Office  [phone]

## 2019-01-18 NOTE — Telephone Encounter (Signed)
Left a detailed message for pt's spouse per her request. Spouse can contact our office for any questions or concerns.

## 2019-01-18 NOTE — Consult Note (Signed)
[facility] Nurse wound consult note Patient receiving care in [facility] ED18. Consult completed remotely by review of record, including photos.  Awaiting MRI results Reason for Consult: LE wound Wound type: Right great toe diabetic foot ulcers Measurement: Entire medial edge of toe is ulcerated as well as a portion of the plantar surface. Toe is edematous Wound bed: pink and yellow Drainage (amount, consistency, odor) none seen on images Periwound: callus Dressing procedure/placement/frequency:  Cleanse right great toe with saline. Pat dry. Wrap Xeroform gauze Kellie Simmering [phone]) around the right great toe.  Then wrap forefoot with kerlex.  Change daily.  Please consider consulting orthopedic or podiatry services for more intricate management of the foot and wound. Monitor the wound area(s) for worsening of condition such as: Signs/symptoms of infection,  Increase in size,  Development of or worsening of odor, Development of pain, or increased pain at the affected locations.  Notify the medical team if any of these develop.  Thank you for the consult.  [place] nurse will not follow at this time.  Please re-consult the [place] team if needed.  Val Riles, RN, MSN, CWOCN, CNS-BC, pager [phone]

## 2019-01-18 NOTE — Telephone Encounter (Signed)
Pt's spouse returned call. Pt's spouse is following up from Tuesdays visit. Pt is currently in the hospital at [facility]. Pt's spouse thinks it's for an infection in his leg or toes. Spouse is concerned about not being able to complete the stool testing the pt needs. Spouse didn't know if the hospital would do the stool testing while pt was in the hospital. Pt's spouse thought that the ? Infection in the stool was the same type of infection that the pt may have in his legs. I explained to pt's spouse that Cdiff is an infection that is found in the stool.

## 2019-01-18 NOTE — Telephone Encounter (Signed)
She can ask his hospital doctor if they would allow him to complete the stool tests ordered (GI path panel and Cdiff). His foot/toe infection is separate from diarrhea issue.

## 2019-01-18 NOTE — Progress Notes (Signed)
Spoke with pt about code status. Per pt, he does not want CPR or intubation done. [place], RN second verify. Dr. Denton Brick notified.

## 2019-01-18 NOTE — Telephone Encounter (Signed)
Pt was seen here yesterday and his wife Hilbert Odor) called today asking to speak with the nurse. [phone]

## 2019-01-19 ENCOUNTER — Encounter: Payer: Self-pay | Admitting: Gastroenterology

## 2019-01-19 LAB — CBC WITH DIFFERENTIAL/PLATELET
Abs Immature Granulocytes: 0.15 10*3/uL — ABNORMAL HIGH (ref 0.00–0.07)
Basophils Absolute: 0.1 10*3/uL (ref 0.0–0.1)
Basophils Relative: 1 %
Eosinophils Absolute: 0.5 10*3/uL (ref 0.0–0.5)
Eosinophils Relative: 4 %
HCT: 36.2 % — ABNORMAL LOW (ref 39.0–52.0)
Hemoglobin: 11.4 g/dL — ABNORMAL LOW (ref 13.0–17.0)
Immature Granulocytes: 1 %
Lymphocytes Relative: 10 %
Lymphs Abs: 1.3 10*3/uL (ref 0.7–4.0)
MCH: 29.1 pg (ref 26.0–34.0)
MCHC: 31.5 g/dL (ref 30.0–36.0)
MCV: 92.3 fL (ref 80.0–100.0)
Monocytes Absolute: 1.6 10*3/uL — ABNORMAL HIGH (ref 0.1–1.0)
Monocytes Relative: 12 %
Neutro Abs: 9.5 10*3/uL — ABNORMAL HIGH (ref 1.7–7.7)
Neutrophils Relative %: 72 %
Platelets: 344 10*3/uL (ref 150–400)
RBC: 3.92 MIL/uL — ABNORMAL LOW (ref 4.22–5.81)
RDW: 12.8 % (ref 11.5–15.5)
WBC: 13.2 10*3/uL — ABNORMAL HIGH (ref 4.0–10.5)
nRBC: 0 % (ref 0.0–0.2)

## 2019-01-19 LAB — GLUCOSE, CAPILLARY
Glucose-Capillary: 105 mg/dL — ABNORMAL HIGH (ref 70–99)
Glucose-Capillary: 152 mg/dL — ABNORMAL HIGH (ref 70–99)
Glucose-Capillary: 69 mg/dL — ABNORMAL LOW (ref 70–99)
Glucose-Capillary: 130 mg/dL — ABNORMAL HIGH (ref 70–99)
Glucose-Capillary: 128 mg/dL — ABNORMAL HIGH (ref 70–99)

## 2019-01-19 LAB — HIV ANTIBODY (ROUTINE TESTING W REFLEX): HIV Screen 4th Generation wRfx: NONREACTIVE

## 2019-01-19 LAB — CREATININE, SERUM
Creatinine, Ser: 1.11 mg/dL (ref 0.61–1.24)
GFR calc Af Amer: >60 mL/min (ref >60)
GFR calc non Af Amer: >60 mL/min (ref >60)

## 2019-01-19 LAB — MRSA PCR SCREENING: MRSA by PCR: NEGATIVE

## 2019-01-19 MED ORDER — GLIPIZIDE 5 MG PO TABS
2.5000 mg | ORAL_TABLET | Freq: Two times a day (BID) | ORAL | Status: DC
  Administered 2019-01-20 – 2019-01-29 (×17): 2.5 mg via ORAL
  Filled 2019-01-19 (×18): qty 1

## 2019-01-19 NOTE — Progress Notes (Signed)
CBG 69. Asymptomatic. Alert and oriented. Drinking OJ.

## 2019-01-19 NOTE — Care Management Important Message (Signed)
Important Message  Patient Details  Name: Wesley Ingram MRN: [id number] Date of Birth: [birth year]   Medicare Important Message Given:  Yes     Tommy Medal 01/19/2019, 1:09 PM

## 2019-01-19 NOTE — Clinical Social Work Note (Signed)
Call to [facility] to notified of [facility]'s hospitalization. [facility]. Notification ID is [id number], [place] referral number [id number].  Bentlee Drier, Clydene Pugh, LCSW

## 2019-01-19 NOTE — Progress Notes (Signed)
Patient Demographics:    Wesley Ingram, is a 73 y.o. male, DOB - [birth year], ZOX:[id number]  Admit date - 01/17/2019   Admitting Physician Bobette Mo, MD  Outpatient Primary MD for the patient is [facility]  LOS - 2   Chief Complaint  Patient presents with   Foot Pain        Subjective:    Wesley Ingram today has no fevers, no emesis,  No chest pain,  Had low blood glucose (60s)  Assessment  & Plan :    Principal Problem:   Diabetic foot ulcer (HCC) Active Problems:   Type 2 diabetes mellitus (HCC)   HTN (hypertension)   Hyperlipidemia   Anemia  Brief summary 73 y.o. male with medical history significant of type 2 diabetes, hypertriglyceridemia, hypertension, renal disorder, history of CVA with residual left-sided hemiparesis admitted on 01/17/2019 with right foot infected diabetic ulcer and osteomyelitis  A/p   Right great toe infected diabetic  ulcer with osteomyelitis- --continue IV Vanco, Flagyl and Rocephin (started 01/17/2019) -MRI of right foot consistent with osteomyelitis  -Vascular studies showed normal ABIs  -continue wound local care. -Trend ESR, CRP,  Follow-up blood culture and sensitivity. Discussed with Dr. Seward Meth to do an arteriogram on 01/23/2019 --Hold off on official surgical consultation until after arteriogram on 01/23/2019    Type 2 diabetes mellitus (HCC) -A1c 6.3 reflecting excellent diabetic control Carbohydrate modified diet. Borderline low blood sugars so decreased glipizide to 2.5 mg p.o. twice daily. Continue to hold metformin-given possible need for contrast studies and vancomycin use with risk for nephrotoxicity- Use Novolog/Humalog Sliding scale insulin with Accu-Cheks/Fingersticks as ordered     HTN (hypertension) -Stable, Continue amlodipine 2.5 mg p.o. daily. Hold losartan --given possible need for contrast studies and  vancomycin use with risk for nephrotoxicity Continue metoprolol 12.5 mg p.o. daily. Monitor BP, HR, renal function electrolytes.    Hyperlipidemia Continue atorvastatin 20 mg p.o. daily.    Chronic Anemia Monitor H&H.  --History of prior CVA/chronic anticoagulation -Continue Eliquis  Social/Ethics--CODE STATUS discussed with patient again, he is a full code  DVT prophylaxis: On apixaban. Code Status: Full code Family Communication: Disposition Plan:  TBD   Disposition/Need for in-Hospital Stay- patient unable to be discharged at this time due to need for IV antibiotics for osteomyelitis may need surgical intervention  Code Status : full  Family Communication:   NA (patient is alert, awake and coherent)   Disposition Plan  : TBD  Consults  : Vascular surgery/Dr Myra Gianotti  DVT Prophylaxis  : Eliquis Lab Results  Component Value Date   PLT 344 01/19/2019    Inpatient Medications  Scheduled Meds:  amLODipine  2.5 mg Oral Daily   apixaban  5 mg Oral BID   atorvastatin  20 mg Oral q morning - 10a   feeding supplement (PRO-STAT SUGAR FREE 64)  30 mL Oral BID   [START ON 01/20/2019] glipiZIDE  2.5 mg Oral BID AC   insulin aspart  0-15 Units Subcutaneous TID WC   metoprolol succinate  12.5 mg Oral Daily   metroNIDAZOLE  500 mg Oral Q8H   nutrition supplement (JUVEN)  1 packet Oral BID BM   Continuous Infusions:  cefTRIAXone (ROCEPHIN)  IV 2 g (01/18/19  2315)   vancomycin 750 mg (01/19/19 1024)   PRN Meds:.acetaminophen **OR** acetaminophen, loperamide, LORazepam    Anti-infectives (From admission, onward)   Start     Dose/Rate Route Frequency Ordered Stop   01/18/19 1000  vancomycin (VANCOCIN) IVPB 750 mg/150 ml premix     750 mg 150 mL/hr over 60 Minutes Intravenous Every 12 hours 01/18/19 0847     01/18/19 0800  vancomycin (VANCOCIN) IVPB 750 mg/150 ml premix  Status:  Discontinued     750 mg 150 mL/hr over 60 Minutes Intravenous Every 12 hours  01/17/19 2235 01/18/19 0847   01/17/19 2230  cefTRIAXone (ROCEPHIN) 2 g in sodium chloride 0.9 % 100 mL IVPB     2 g 200 mL/hr over 30 Minutes Intravenous Every 24 hours 01/17/19 2224     01/17/19 2230  metroNIDAZOLE (FLAGYL) tablet 500 mg     500 mg Oral Every 8 hours 01/17/19 2224     01/17/19 2215  vancomycin (VANCOCIN) IVPB 1000 mg/200 mL premix     1,000 mg 200 mL/hr over 60 Minutes Intravenous  Once 01/17/19 2208 01/17/19 2340        Objective:   Vitals:   01/18/19 1600 01/18/19 1611 01/18/19 2213 01/19/19 0523  BP:  (!) 155/72 117/70 (!) 119/53  Pulse:  73 79 80  Resp:  16 20 16   Temp:  98.5 F (36.9 C) 98.8 F (37.1 C) 98.3 F (36.8 C)  TempSrc:  Oral Oral Oral  SpO2:  99% 95% 97%  Weight: 73.6 kg     Height:        Wt Readings from Last 3 Encounters:  01/18/19 73.6 kg  01/16/19 72.6 kg  12/30/18 74.8 kg     Intake/Output Summary (Last 24 hours) at 01/19/2019 1724 Last data filed at 01/19/2019 1400 Gross per 24 hour  Intake 1266.63 ml  Output 3300 ml  Net -2033.37 ml     Physical Exam  Gen:- Awake Alert,  In no apparent distress  HEENT:- [place].AT, No sclera icterus Neck-Supple Neck,No JVD,.  Lungs-  CTAB , fair symmetrical air movement CV- S1, S2 normal, regular  Abd-  +ve B.Sounds, Abd Soft, No tenderness,    Extremity--pedal pulses present  Psych-affect is appropriate, oriented x3 Neuro-no new focal deficits, no tremors MSK--right great toe findings as noted in photos in epic  Data Review:   Micro Results Recent Results (from the past 240 hour(s))  Blood culture (routine x 2)     Status: None (Preliminary result)   Collection Time: 01/17/19  8:48 PM   Specimen: BLOOD RIGHT ARM  Result Value Ref Range Status   Specimen Description BLOOD RIGHT ARM  Final   Special Requests   Final    BOTTLES DRAWN AEROBIC AND ANAEROBIC Blood Culture adequate volume   Culture   Final    NO GROWTH 2 DAYS Performed at [facility], [address], [place],  [place] [address]    Report Status PENDING  Incomplete  Blood culture (routine x 2)     Status: None (Preliminary result)   Collection Time: 01/17/19  8:57 PM   Specimen: BLOOD RIGHT HAND  Result Value Ref Range Status   Specimen Description BLOOD RIGHT HAND  Final   Special Requests   Final    BOTTLES DRAWN AEROBIC AND ANAEROBIC Blood Culture adequate volume   Culture   Final    NO GROWTH 2 DAYS Performed at [facility], [address], [place], [place] [address]  Report Status PENDING  Incomplete  SARS CORONAVIRUS 2 Nasal Swab Aptima Multi Swab     Status: None   Collection Time: 01/18/19 12:39 AM   Specimen: Aptima Multi Swab; Nasal Swab  Result Value Ref Range Status   SARS Coronavirus 2 NEGATIVE NEGATIVE Final    Comment: (NOTE) SARS-CoV-2 target nucleic acids are NOT DETECTED. The SARS-CoV-2 RNA is generally detectable in upper and lower respiratory specimens during the acute phase of infection. Negative results do not preclude SARS-CoV-2 infection, do not rule out co-infections with other pathogens, and should not be used as the sole basis for treatment or other patient management decisions. Negative results must be combined with clinical observations, patient history, and epidemiological information. The expected result is Negative. Fact Sheet for Patients: HairSlick.no Fact Sheet for Healthcare Providers: quierodirigir.com This test is not yet approved or cleared by the [place] FDA and  has been authorized for detection and/or diagnosis of SARS-CoV-2 by FDA under an Emergency Use Authorization (EUA). This EUA will remain  in effect (meaning this test can be used) for the duration of the COVID-19 declaration under Section 56 4(b)(1) of the Act, 21 U.S.C. section 360bbb-3(b)(1), unless the authorization is terminated or revoked sooner. Performed at [facility], [address] [place], [place] [address]    MRSA PCR Screening     Status: None   Collection Time: 01/19/19  6:38 AM   Specimen: Nasal Mucosa; Nasopharyngeal  Result Value Ref Range Status   MRSA by PCR NEGATIVE NEGATIVE Final    Comment:        The GeneXpert MRSA Assay (FDA approved for NASAL specimens only), is one component of a comprehensive MRSA colonization surveillance program. It is not intended to diagnose MRSA infection nor to guide or monitor treatment for MRSA infections. Performed at [facility], [address], [place], [place] [address]     Radiology Reports Mr Foot Right Wo Contrast  Result Date: 01/18/2019 CLINICAL DATA:  Diabetic patient with an open wound on the right great toe. EXAM: MRI OF THE RIGHT FOREFOOT WITHOUT CONTRAST TECHNIQUE: Multiplanar, multisequence MR imaging of the right forefoot was performed. No intravenous contrast was administered. COMPARISON:  Plain films right foot 01/17/2019. FINDINGS: Bones/Joint/Cartilage Motion degrades the examination. There is marrow edema throughout the distal phalanx of the great toe consistent with osteomyelitis. No other evidence of osteomyelitis is identified. Ligaments Intact. Muscles and Tendons Intact. No intramuscular fluid collection. 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 Soft tissues Skin wound at the distal aspect of the great toe is identified. No abscess is seen. IMPRESSION: Motion degraded examination demonstrates marrow edema throughout the distal phalanx of the great toe consistent with osteomyelitis. No abscess is identified. No evidence of septic joint. Electronically Signed   By: Drusilla Kanner M.D.   On: 01/18/2019 07:35   US Arterial Abi (screening Lower Extremity)  Result Date: 01/18/2019 CLINICAL DATA:  Right diabetic open foot wound, great toe. Previous amputation. EXAM: NONINVASIVE PHYSIOLOGIC VASCULAR STUDY OF BILATERAL LOWER EXTREMITIES TECHNIQUE: Evaluation of both  lower extremities were performed at rest, including calculation of ankle-brachial indices with single level Doppler, pressure and pulse volume recording. COMPARISON:  None. FINDINGS: Right ABI:  1.28 Left ABI:  1.16 Right Lower Extremity:  Multiphasic waveform at the ankle. Left Lower Extremity:  Multiphasic waveform at the ankle. IMPRESSION: Normal bilateral ABIs. Vascular calcification and resultant limited compressibility in the setting of diabetes can  result in elevated values however. Electronically Signed   By: Corlis Leak M.D.   On: 01/18/2019 09:50   Dg Foot Complete Right  Result Date: 01/17/2019 CLINICAL DATA:  Swelling and drainage from right great toe. History of diabetes. EXAM: RIGHT FOOT COMPLETE - 3+ VIEW COMPARISON:  Radiographs 12/30/2018 FINDINGS: No acute fracture or traumatic malalignment. There is soft tissue swelling and ulceration at the tip of the first digit. No subcutaneous gas few punctate radiodensities are present within the soft tissues which are new from prior exam and could reflect debris or foreign body. No bony erosion, destructive change or immature periostitis to suggest early radiographic features of osteomyelitis. 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 Hammertoe deformities of the third and fourth digits. May mild mid foot arthrosis is present as well. Please note alignment of the mid and hindfoot is incompletely assessed on nonweightbearing radiographs. Calcaneal spurs are present at the Achilles and plantar ligament insertions. IMPRESSION: 1. Soft tissue swelling and ulceration at the tip of the first digit. Few punctate radiodensities within the soft tissues are new from prior exam and could reflect debris or foreign body and should be correlated with visual inspection. 2. No radiographic evidence of osteomyelitis. MRI is more sensitive and specific for early findings of osteomyelitis. Electronically Signed   By: Kreg Shropshire M.D.   On:  01/17/2019 21:41   Dg Foot Complete Right  Result Date: 12/30/2018 CLINICAL DATA:  Infection, right great toe EXAM: RIGHT FOOT COMPLETE - 3+ VIEW COMPARISON:  None. FINDINGS: No fracture or dislocation of the right foot. No bony erosion or sclerosis of the great toe to suggest osteomyelitis. There is moderate first metatarsophalangeal arthrosis. Mild midfoot arthrosis. The remaining joint spaces are well preserved. Mild soft tissue edema of the great toe. IMPRESSION: No fracture or dislocation of the right foot. No bony erosion or sclerosis of the great toe to suggest osteomyelitis. There is moderate first metatarsophalangeal arthrosis. Mild midfoot arthrosis. The remaining joint spaces are well preserved. Mild soft tissue edema of the great toe. MRI may be used to more sensitively evaluate for bone marrow edema and osteomyelitis if suspected. Electronically Signed   By: Lauralyn Primes M.D.   On: 12/30/2018 13:10     CBC Recent Labs  Lab 01/17/19 2057 01/18/19 0408 01/19/19 0507  WBC 11.8* 10.9* 13.2*  HGB 11.7* 10.7* 11.4*  HCT 36.0* 33.1* 36.2*  PLT 364 308 344  MCV 89.8 91.2 92.3  MCH 29.2 29.5 29.1  MCHC 32.5 32.3 31.5  RDW 13.0 12.9 12.8  LYMPHSABS 2.2 2.0 1.3  MONOABS 1.4* 1.4* 1.6*  EOSABS 0.4 0.4 0.5  BASOSABS 0.1 0.1 0.1    Chemistries  Recent Labs  Lab 01/17/19 2057 01/19/19 0507  NA 133*  --   K 4.4  --   CL 102  --   CO2 22  --   GLUCOSE 181*  --   BUN 37*  --   CREATININE 1.21 1.11  CALCIUM 8.9  --    ------------------------------------------------------------------------------------------------------------------ No results for input(s): CHOL, HDL, LDLCALC, TRIG, CHOLHDL, LDLDIRECT in the last 72 hours.  Lab Results  Component Value Date   HGBA1C 6.3 (H) 01/18/2019   ------------------------------------------------------------------------------------------------------------------ No results for input(s): TSH, T4TOTAL, T3FREE, THYROIDAB in the last 72  hours.  Invalid input(s): FREET3 ------------------------------------------------------------------------------------------------------------------ No results for input(s): VITAMINB12, FOLATE, FERRITIN, TIBC, IRON, RETICCTPCT in the last 72 hours.  Coagulation profile No results for input(s): INR, PROTIME in the last 168 hours.  No  results for input(s): DDIMER in the last 72 hours.  Cardiac Enzymes No results for input(s): CKMB, TROPONINI, MYOGLOBIN in the last 168 hours.  Invalid input(s): CK ------------------------------------------------------------------------------------------------------------------ No results found for: BNP   Shon Hale M.D on 01/19/2019 at 5:24 PM  Go to [email or web address] - for contact info  [facility] - Office  [phone]

## 2019-01-20 LAB — CBC WITH DIFFERENTIAL/PLATELET
Abs Immature Granulocytes: 0.1 10*3/uL — ABNORMAL HIGH (ref 0.00–0.07)
Basophils Absolute: 0.1 10*3/uL (ref 0.0–0.1)
Basophils Relative: 1 %
Eosinophils Absolute: 0.5 10*3/uL (ref 0.0–0.5)
Eosinophils Relative: 5 %
HCT: 35 % — ABNORMAL LOW (ref 39.0–52.0)
Hemoglobin: 11 g/dL — ABNORMAL LOW (ref 13.0–17.0)
Immature Granulocytes: 1 %
Lymphocytes Relative: 18 %
Lymphs Abs: 1.8 10*3/uL (ref 0.7–4.0)
MCH: 28.5 pg (ref 26.0–34.0)
MCHC: 31.4 g/dL (ref 30.0–36.0)
MCV: 90.7 fL (ref 80.0–100.0)
Monocytes Absolute: 1.3 10*3/uL — ABNORMAL HIGH (ref 0.1–1.0)
Monocytes Relative: 13 %
Neutro Abs: 6.2 10*3/uL (ref 1.7–7.7)
Neutrophils Relative %: 62 %
Platelets: 345 10*3/uL (ref 150–400)
RBC: 3.86 MIL/uL — ABNORMAL LOW (ref 4.22–5.81)
RDW: 13 % (ref 11.5–15.5)
WBC: 10 10*3/uL (ref 4.0–10.5)
nRBC: 0 % (ref 0.0–0.2)

## 2019-01-20 LAB — BASIC METABOLIC PANEL
Anion gap: 7 (ref 5–15)
BUN: 31 mg/dL — ABNORMAL HIGH (ref 8–23)
CO2: 26 mmol/L (ref 22–32)
Calcium: 8.9 mg/dL (ref 8.9–10.3)
Chloride: 105 mmol/L (ref 98–111)
Creatinine, Ser: 1.15 mg/dL (ref 0.61–1.24)
GFR calc Af Amer: >60 mL/min (ref >60)
GFR calc non Af Amer: >60 mL/min (ref >60)
Glucose, Bld: 162 mg/dL — ABNORMAL HIGH (ref 70–99)
Potassium: 3.8 mmol/L (ref 3.5–5.1)
Sodium: 138 mmol/L (ref 135–145)

## 2019-01-20 LAB — GLUCOSE, CAPILLARY
Glucose-Capillary: 109 mg/dL — ABNORMAL HIGH (ref 70–99)
Glucose-Capillary: 189 mg/dL — ABNORMAL HIGH (ref 70–99)
Glucose-Capillary: 166 mg/dL — ABNORMAL HIGH (ref 70–99)
Glucose-Capillary: 195 mg/dL — ABNORMAL HIGH (ref 70–99)

## 2019-01-20 LAB — VANCOMYCIN, PEAK: Vancomycin Pk: 32 ug/mL (ref 30–40)

## 2019-01-20 LAB — VANCOMYCIN, TROUGH: Vancomycin Tr: 17 ug/mL (ref 15–20)

## 2019-01-20 MED ORDER — LOPERAMIDE HCL 2 MG PO CAPS
2.0000 mg | ORAL_CAPSULE | Freq: Two times a day (BID) | ORAL | Status: DC | PRN
  Administered 2019-01-21 – 2019-01-22 (×2): 2 mg via ORAL
  Filled 2019-01-20 (×2): qty 1

## 2019-01-20 MED ORDER — DIPHENOXYLATE-ATROPINE 2.5-0.025 MG PO TABS
2.0000 | ORAL_TABLET | Freq: Once | ORAL | Status: AC
  Administered 2019-01-20: 2 via ORAL
  Filled 2019-01-20: qty 2

## 2019-01-20 NOTE — Progress Notes (Signed)
Pharmacy Antibiotic Note  Wesley Ingram is a 73 y.o. male admitted on 01/17/2019 with diabetic foot infection.  Pharmacy has been consulted for vancomycin dosing.  Vancomycin trough of 17 and vancomycin peak of 32 collected 8/8am  Plan: Continue Vancomycin 750mg  IV every 12 hours.  Goal trough 15-20 mcg/mL.  Height: 5\' 10"  (177.8 cm) Weight: 162 lb 4.1 oz (73.6 kg) IBW/kg (Calculated) : 73  Temp (24hrs), Avg:98.6 F (37 C), Min:98.3 F (36.8 C), Max:98.8 F (37.1 C)  Recent Labs  Lab 01/17/19 2057 01/18/19 0408 01/19/19 0507 01/19/19 2351 01/20/19 0604 01/20/19 0902  WBC 11.8* 10.9* 13.2*  --  10.0  --   CREATININE 1.21  --  1.11  --  1.15  --   VANCOTROUGH  --   --   --   --   --  17  VANCOPEAK  --   --   --  32  --   --     Estimated Creatinine Clearance: 60.8 mL/min (by C-G formula based on SCr of 1.15 mg/dL).    No Known Allergies  Antimicrobials this admission: 8/5 vanco >>  8/5 ceftriaxone >>  8/5 metronidazole >>  Microbiology results: 8/5 BCx: sent 8/5 Covid 19: sent    Thank you for allowing pharmacy to be a part of this patient's care.  Donna Christen Katlyn Muldrew 01/20/2019 9:48 AM

## 2019-01-20 NOTE — Progress Notes (Signed)
Patient Demographics:    Wesley Ingram, is a 73 y.o. male, DOB - 01/22/1946, ZOX:[id number]  Admit date - 01/17/2019   Admitting Physician Bobette Mo, MD  Outpatient Primary [facility] the [facility]  LOS - 3   Chief Complaint  Patient presents with   Foot Pain        Subjective:    Wesley Ingram today has no fevers, no emesis,  No chest pain,  --had 4 loose stools today (history of ongoing diarrhea PTA)   Assessment  & Plan :    Principal Problem:   Diabetic foot ulcer (HCC) Active Problems:   Type 2 diabetes mellitus (HCC)   HTN (hypertension)   Hyperlipidemia   Anemia  Brief summary 73 y.o. male with medical history significant of type 2 diabetes, hypertriglyceridemia, hypertension, renal disorder, history of CVA with residual left-sided hemiparesis admitted on 01/17/2019 with right foot infected diabetic ulcer and osteomyelitis  A/p   Right great toe infected diabetic  ulcer with osteomyelitis- --continue IV Vanco, Flagyl and Rocephin (started 01/17/2019) -MRI of right foot consistent with osteomyelitis  -Vascular studies showed normal ABIs  -continue wound local care. -Trend ESR, CRP,  Follow-up blood culture and sensitivity. Discussed with Dr. Seward Meth to do an arteriogram on 01/23/2019 --Hold off on official surgical consultation until after arteriogram on 01/23/2019    Type 2 diabetes mellitus (HCC) -A1c 6.3 reflecting excellent diabetic control Carbohydrate modified diet. Borderline low blood sugars so decreased glipizide to 2.5 mg p.o. twice daily. Continue to hold metformin-given possible need for contrast studies and vancomycin use with risk for nephrotoxicity- Use Novolog/Humalog Sliding scale insulin with Accu-Cheks/Fingersticks as ordered     HTN (hypertension) -Stable, Continue amlodipine 2.5 mg p.o. daily. Hold losartan --given possible  need for contrast studies and vancomycin use with risk for nephrotoxicity Continue metoprolol 12.5 mg p.o. daily. Monitor BP, HR, renal function electrolytes.    Hyperlipidemia Continue atorvastatin 20 mg p.o. daily.    Chronic Anemia Monitor H&H.  --History of prior CVA/chronic anticoagulation -Continue Eliquis  --H/o DVT--- c/n Eliquis  Social/Ethics--CODE STATUS discussed with patient again, he is a full code   Diarrhea--- apparently patient has been having diarrhea lately few months prior to admission, was seen by GI as outpatient on 01/16/2019--- gastroenterologist at that time requested to for C. difficile and stool for GI pathogen to rule out infectious etiology prior to embarking on colonoscopy --[facility] as needed Imodium  DVT prophylaxis: On apixaban. Code Status: Full code Family Communication: Disposition Plan:  TBD   Disposition/Need for in-Hospital Stay- patient unable to be discharged at this time due to need for IV antibiotics for osteomyelitis may need surgical intervention  Code Status : full  Family Communication:   NA (patient is alert, awake and coherent) Discussed with patient's wife by phone  Disposition Plan  : TBD  Consults  : Vascular surgery/Dr Myra Gianotti  DVT Prophylaxis  : Eliquis Lab Results  Component Value Date   PLT 345 01/20/2019    Inpatient Medications  Scheduled Meds:  amLODipine  2.5 mg Oral Daily   apixaban  5 mg Oral BID   atorvastatin  20 mg Oral q morning - 10a   feeding supplement (  PRO-STAT SUGAR FREE 64)  30 mL Oral BID   glipiZIDE  2.5 mg Oral BID AC   insulin aspart  0-15 Units Subcutaneous TID WC   metoprolol succinate  12.5 mg Oral Daily   metroNIDAZOLE  500 mg Oral Q8H   nutrition supplement (JUVEN)  1 packet Oral BID BM   Continuous Infusions:  cefTRIAXone (ROCEPHIN)  IV 2 g (01/19/19 2230)   vancomycin 750 mg (01/20/19 1127)   PRN Meds:.acetaminophen **OR** acetaminophen,  loperamide    Anti-infectives (From admission, onward)   Start     Dose/Rate Route Frequency Ordered Stop   01/18/19 1000  vancomycin (VANCOCIN) IVPB 750 mg/150 ml premix     750 mg 150 mL/hr over 60 Minutes Intravenous Every 12 hours 01/18/19 0847     01/18/19 0800  vancomycin (VANCOCIN) IVPB 750 mg/150 ml premix  Status:  Discontinued     750 mg 150 mL/hr over 60 Minutes Intravenous Every 12 hours 01/17/19 2235 01/18/19 0847   01/17/19 2230  cefTRIAXone (ROCEPHIN) 2 g in sodium chloride 0.9 % 100 mL IVPB     2 g 200 mL/hr over 30 Minutes Intravenous Every 24 hours 01/17/19 2224     01/17/19 2230  metroNIDAZOLE (FLAGYL) tablet 500 mg     500 mg Oral Every 8 hours 01/17/19 2224     01/17/19 2215  vancomycin (VANCOCIN) IVPB 1000 mg/200 mL premix     1,000 mg 200 mL/hr over 60 Minutes Intravenous  Once 01/17/19 2208 01/17/19 2340        Objective:   Vitals:   01/19/19 0523 01/19/19 1957 01/19/19 2221 01/20/19 0315  BP: (!) 119/53  113/79 (!) 114/56  Pulse: 80  79 71  Resp: 16  20 18   Temp: 98.3 F (36.8 C)  98.8 F (37.1 C) 98.3 F (36.8 C)  TempSrc: Oral  Oral Oral  SpO2: 97% 95% 96% 98%  Weight:      Height:        Wt Readings from Last 3 Encounters:  01/18/19 73.6 kg  01/16/19 72.6 kg  12/30/18 74.8 kg    Intake/Output Summary (Last 24 hours) at 01/20/2019 1321 Last data filed at 01/20/2019 0900 Gross per 24 hour  Intake 480 ml  Output 2750 ml  Net -2270 ml   Physical Exam Gen:- Awake Alert,  In no apparent distress  HEENT:- [place].AT, No sclera icterus Neck-Supple Neck,No JVD,.  Lungs-  CTAB , fair symmetrical air movement CV- S1, S2 normal, regular  Abd-  +ve B.Sounds, Abd Soft, No tenderness,    Extremity--pedal pulses present  Psych-affect is appropriate, oriented x3 Neuro-Chronic Lt sided Hemiparesis, but no new focal deficits, no tremors MSK--right great toe findings as noted in photos in epic  Data Review:   Micro Results Recent Results (from the  past 240 hour(s))  Blood culture (routine x 2)     Status: None (Preliminary result)   Collection Time: 01/17/19  8:48 PM   Specimen: BLOOD RIGHT ARM  Result Value Ref Range Status   Specimen Description BLOOD RIGHT ARM  Final   Special Requests   Final    BOTTLES DRAWN AEROBIC AND ANAEROBIC Blood Culture adequate volume   Culture   Final    NO GROWTH 3 DAYS Performed at [facility], [address], [place], [place] [address]    Report Status PENDING  Incomplete  Blood culture (routine x 2)     Status: None (Preliminary result)   Collection Time: 01/17/19  8:57  PM   Specimen: BLOOD RIGHT HAND  Result Value Ref Range Status   Specimen Description BLOOD RIGHT HAND  Final   Special Requests   Final    BOTTLES DRAWN AEROBIC AND ANAEROBIC Blood Culture adequate volume   Culture   Final    NO GROWTH 3 DAYS Performed at [facility], [address] [place], [place] [address]    Report Status PENDING  Incomplete  SARS CORONAVIRUS 2 Nasal Swab Aptima Multi Swab     Status: None   Collection Time: 01/18/19 12:39 AM   Specimen: Aptima Multi Swab; Nasal Swab  Result Value Ref Range Status   SARS Coronavirus 2 NEGATIVE NEGATIVE Final    Comment: (NOTE) SARS-CoV-2 target nucleic acids are NOT DETECTED. The SARS-CoV-2 RNA is generally detectable in upper and lower respiratory specimens during the acute phase of infection. Negative results do not preclude SARS-CoV-2 infection, do not rule out co-infections with other pathogens, and should not be used as the sole basis for treatment or other patient management decisions. Negative results must be combined with clinical observations, patient history, and epidemiological information. The expected result is Negative. Fact Sheet for Patients: HairSlick.no Fact Sheet for Healthcare Providers: quierodirigir.com This test is not yet approved or cleared by the [place] FDA and  has been  authorized for detection and/or diagnosis of SARS-CoV-2 by FDA under an Emergency Use Authorization (EUA). This EUA will remain  in effect (meaning this test can be used) for the duration of the COVID-19 declaration under Section 56 4(b)(1) of the Act, 21 U.S.C. section 360bbb-3(b)(1), unless the authorization is terminated or revoked sooner. Performed at [facility], [address], [place], [place] [address]   MRSA PCR Screening     Status: None   Collection Time: 01/19/19  6:38 AM   Specimen: Nasal Mucosa; Nasopharyngeal  Result Value Ref Range Status   MRSA by PCR NEGATIVE NEGATIVE Final    Comment:        The GeneXpert MRSA Assay (FDA approved for NASAL specimens only), is one component of a comprehensive MRSA colonization surveillance program. It is not intended to diagnose MRSA infection nor to guide or monitor treatment for MRSA infections. Performed at [facility], [address] [place], [place] [address]     Radiology Reports Mr Foot Right Wo Contrast  Result Date: 01/18/2019 CLINICAL DATA:  Diabetic patient with an open wound on the right great toe. EXAM: MRI OF THE RIGHT FOREFOOT WITHOUT CONTRAST TECHNIQUE: Multiplanar, multisequence MR imaging of the right forefoot was performed. No intravenous contrast was administered. COMPARISON:  Plain films right foot 01/17/2019. FINDINGS: Bones/Joint/Cartilage Motion degrades the examination. There is marrow edema throughout the distal phalanx of the great toe consistent with osteomyelitis. No other evidence of osteomyelitis is identified. Ligaments Intact. Muscles and Tendons Intact. No intramuscular fluid collection. 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 Soft tissues Skin wound at the distal aspect of the great toe is identified. No abscess is seen. IMPRESSION: Motion degraded examination demonstrates marrow edema throughout the distal phalanx of the  great toe consistent with osteomyelitis. No abscess is identified. No evidence of septic joint. Electronically Signed   By: Drusilla Kanner M.D.   On: 01/18/2019 07:35   US Arterial Abi (screening Lower Extremity)  Result Date: 01/18/2019 CLINICAL DATA:  Right diabetic open foot wound, great toe. Previous amputation. EXAM: NONINVASIVE PHYSIOLOGIC VASCULAR STUDY OF BILATERAL LOWER EXTREMITIES TECHNIQUE: Evaluation  of both lower extremities were performed at rest, including calculation of ankle-brachial indices with single level Doppler, pressure and pulse volume recording. COMPARISON:  None. FINDINGS: Right ABI:  1.28 Left ABI:  1.16 Right Lower Extremity:  Multiphasic waveform at the ankle. Left Lower Extremity:  Multiphasic waveform at the ankle. IMPRESSION: Normal bilateral ABIs. Vascular calcification and resultant limited compressibility in the setting of diabetes can result in elevated values however. Electronically Signed   By: Corlis Leak M.D.   On: 01/18/2019 09:50   Dg Foot Complete Right  Result Date: 01/17/2019 CLINICAL DATA:  Swelling and drainage from right great toe. History of diabetes. EXAM: RIGHT FOOT COMPLETE - 3+ VIEW COMPARISON:  Radiographs 12/30/2018 FINDINGS: No acute fracture or traumatic malalignment. There is soft tissue swelling and ulceration at the tip of the first digit. No subcutaneous gas few punctate radiodensities are present within the soft tissues which are new from prior exam and could reflect debris or foreign body. No bony erosion, destructive change or immature periostitis to suggest early radiographic features of osteomyelitis. 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 Hammertoe deformities of the third and fourth digits. May mild mid foot arthrosis is present as well. Please note alignment of the mid and hindfoot is incompletely assessed on nonweightbearing radiographs. Calcaneal spurs are present at the Achilles and plantar ligament  insertions. IMPRESSION: 1. Soft tissue swelling and ulceration at the tip of the first digit. Few punctate radiodensities within the soft tissues are new from prior exam and could reflect debris or foreign body and should be correlated with visual inspection. 2. No radiographic evidence of osteomyelitis. MRI is more sensitive and specific for early findings of osteomyelitis. Electronically Signed   By: Kreg Shropshire M.D.   On: 01/17/2019 21:41   Dg Foot Complete Right  Result Date: 12/30/2018 CLINICAL DATA:  Infection, right great toe EXAM: RIGHT FOOT COMPLETE - 3+ VIEW COMPARISON:  None. FINDINGS: No fracture or dislocation of the right foot. No bony erosion or sclerosis of the great toe to suggest osteomyelitis. There is moderate first metatarsophalangeal arthrosis. Mild midfoot arthrosis. The remaining joint spaces are well preserved. Mild soft tissue edema of the great toe. IMPRESSION: No fracture or dislocation of the right foot. No bony erosion or sclerosis of the great toe to suggest osteomyelitis. There is moderate first metatarsophalangeal arthrosis. Mild midfoot arthrosis. The remaining joint spaces are well preserved. Mild soft tissue edema of the great toe. MRI may be used to more sensitively evaluate for bone marrow edema and osteomyelitis if suspected. Electronically Signed   By: Lauralyn Primes M.D.   On: 12/30/2018 13:10     CBC Recent Labs  Lab 01/17/19 2057 01/18/19 0408 01/19/19 0507 01/20/19 0604  WBC 11.8* 10.9* 13.2* 10.0  HGB 11.7* 10.7* 11.4* 11.0*  HCT 36.0* 33.1* 36.2* 35.0*  PLT 364 308 344 345  MCV 89.8 91.2 92.3 90.7  MCH 29.2 29.5 29.1 28.5  MCHC 32.5 32.3 31.5 31.4  RDW 13.0 12.9 12.8 13.0  LYMPHSABS 2.2 2.0 1.3 1.8  MONOABS 1.4* 1.4* 1.6* 1.3*  EOSABS 0.4 0.4 0.5 0.5  BASOSABS 0.1 0.1 0.1 0.1    Chemistries  Recent Labs  Lab 01/17/19 2057 01/19/19 0507 01/20/19 0604  NA 133*  --  138  K 4.4  --  3.8  CL 102  --  105  CO2 22  --  26  GLUCOSE 181*  --   162*  BUN 37*  --  31*  CREATININE 1.21  1.11 1.15  CALCIUM 8.9  --  8.9   ------------------------------------------------------------------------------------------------------------------ No results for input(s): CHOL, HDL, LDLCALC, TRIG, CHOLHDL, LDLDIRECT in the last 72 hours.  Lab Results  Component Value Date   HGBA1C 6.3 (H) 01/18/2019   ------------------------------------------------------------------------------------------------------------------ No results for input(s): TSH, T4TOTAL, T3FREE, THYROIDAB in the last 72 hours.  Invalid input(s): FREET3 ------------------------------------------------------------------------------------------------------------------ No results for input(s): VITAMINB12, FOLATE, FERRITIN, TIBC, IRON, RETICCTPCT in the last 72 hours.  Coagulation profile No results for input(s): INR, PROTIME in the last 168 hours.  No results for input(s): DDIMER in the last 72 hours.  Cardiac Enzymes No results for input(s): CKMB, TROPONINI, MYOGLOBIN in the last 168 hours.  Invalid input(s): CK ------------------------------------------------------------------------------------------------------------------ No results found for: BNP  Shon Hale M.D on 01/20/2019 at 1:21 PM  Go to [email or web address] - for contact info  [facility] - Office  [phone]

## 2019-01-21 LAB — CBC WITH DIFFERENTIAL/PLATELET
Abs Immature Granulocytes: 0.18 10*3/uL — ABNORMAL HIGH (ref 0.00–0.07)
Basophils Absolute: 0.1 10*3/uL (ref 0.0–0.1)
Basophils Relative: 1 %
Eosinophils Absolute: 0.6 10*3/uL — ABNORMAL HIGH (ref 0.0–0.5)
Eosinophils Relative: 7 %
HCT: 37.2 % — ABNORMAL LOW (ref 39.0–52.0)
Hemoglobin: 11.8 g/dL — ABNORMAL LOW (ref 13.0–17.0)
Immature Granulocytes: 2 %
Lymphocytes Relative: 20 %
Lymphs Abs: 1.8 10*3/uL (ref 0.7–4.0)
MCH: 29.1 pg (ref 26.0–34.0)
MCHC: 31.7 g/dL (ref 30.0–36.0)
MCV: 91.6 fL (ref 80.0–100.0)
Monocytes Absolute: 1.3 10*3/uL — ABNORMAL HIGH (ref 0.1–1.0)
Monocytes Relative: 14 %
Neutro Abs: 5.3 10*3/uL (ref 1.7–7.7)
Neutrophils Relative %: 56 %
Platelets: 350 10*3/uL (ref 150–400)
RBC: 4.06 MIL/uL — ABNORMAL LOW (ref 4.22–5.81)
RDW: 12.9 % (ref 11.5–15.5)
WBC: 9.3 10*3/uL (ref 4.0–10.5)
nRBC: 0 % (ref 0.0–0.2)

## 2019-01-21 LAB — GLUCOSE, CAPILLARY
Glucose-Capillary: 100 mg/dL — ABNORMAL HIGH (ref 70–99)
Glucose-Capillary: 139 mg/dL — ABNORMAL HIGH (ref 70–99)
Glucose-Capillary: 84 mg/dL (ref 70–99)
Glucose-Capillary: 122 mg/dL — ABNORMAL HIGH (ref 70–99)

## 2019-01-21 MED ORDER — SODIUM CHLORIDE 0.9 % IV SOLN
INTRAVENOUS | Status: DC | PRN
  Administered 2019-01-21: 500 mL via INTRAVENOUS
  Administered 2019-01-22: 250 mL via INTRAVENOUS

## 2019-01-21 NOTE — Progress Notes (Signed)
°                                         ° ° Patient Demographics:  ° ° Wesley Ingram, is a 72 y.o. male, DOB - 01/03/1946, MRN:[id number] ° °Admit date - 01/17/2019   Admitting Physician David Manuel Ortiz, MD ° °Outpatient Primary MD for the patient is [facility] ° °LOS - 4 ° ° °Chief Complaint  °Patient presents with  °• Foot Pain  °    ° ° Subjective:  ° ° Wesley Ingram today has no fevers, no emesis,  No chest pain,  --history of ongoing diarrhea PTA, stools here appears to be loose but not watery ° ° Assessment  & Plan :  ° ° Principal Problem: °  Diabetic foot ulcer (HCC) °Active Problems: °  Type 2 diabetes mellitus (HCC) °  HTN (hypertension) °  Hyperlipidemia °  Anemia ° °Brief summary °72 y.o. male with medical history significant of type 2 diabetes, hypertriglyceridemia, hypertension, renal disorder, history of CVA with residual left-sided hemiparesis admitted on 01/17/2019 with right foot infected diabetic ulcer and osteomyelitis, °-Transfer from [facility] to [place] on 01/22/2019 for further vascular surgery intervention ° Discussed with Dr. Brabham--plans to do an arteriogram on 01/23/2019 ° °A/p ° ° Right great toe infected diabetic  ulcer with osteomyelitis- °--continue IV Vanco, Flagyl and Rocephin (started 01/17/2019) °-MRI of right foot consistent with osteomyelitis  °-Vascular studies showed normal ABIs  °-continue local wound care. °-Trend ESR, CRP,  °Follow-up blood culture and sensitivity. °-Transfer to [place] on 01/22/2019 for further vascular surgery intervention ° Discussed with Dr. Brabham--plans to do an arteriogram on 01/23/2019 °--Hold off on official surgical consultation until after arteriogram on 01/23/2019 ° °  Type 2 diabetes mellitus (HCC) °-A1c 6.3 reflecting excellent diabetic control °Carbohydrate modified diet. °Borderline low blood sugars so decreased glipizide to 2.5 mg p.o. twice daily. °Continue to hold metformin-given possible need  for contrast studies and vancomycin use with risk for nephrotoxicity- °Use Novolog/Humalog Sliding scale insulin with Accu-Cheks/Fingersticks as ordered °   °  HTN (hypertension) °-Stable, °Continue amlodipine 2.5 mg p.o. daily. °Hold losartan --given possible need for contrast studies and vancomycin use with risk for nephrotoxicity °Continue metoprolol 12.5 mg p.o. daily. °Monitor BP, HR, renal function electrolytes. °  °  Hyperlipidemia °Continue atorvastatin 20 mg p.o. daily. ° °  Chronic Anemia °Monitor H&H. ° °--History of prior CVA/chronic anticoagulation °-Continue Eliquis ° °--H/o DVT--- c/n Eliquis ° °Social/Ethics--CODE STATUS discussed with patient again, he is a full code  ° °Diarrhea--- apparently patient has been having diarrhea lately few months prior to admission, was seen by GI as outpatient on 01/16/2019--- gastroenterologist at that time requested to for C. difficile and stool for GI pathogen to rule out infectious etiology prior to embarking on colonoscopy °--[facility] Imodium °-   stools here appears to be loose but not watery.. Stool sample is not acceptable for C. difficile testing, stool GI pathogen pending ° °DVT prophylaxis: On apixaban. °Code Status: Full code °Family Communication: °Disposition Plan:  TBD  ° °Disposition/Need for in-Hospital Stay- patient unable to be discharged at this time due to need for IV antibiotics for osteomyelitis may need surgical intervention ° °Code Status : full ° °Family Communication:   NA (patient is alert, awake and coherent) °Discussed with patient's wife by phone ° °Disposition Plan  : TBD ° °  °                                         ° ° Patient Demographics:  ° ° Wesley Ingram, is a 72 y.o. male, DOB - 05/12/1946, MRN:[id number] ° °Admit date - 01/17/2019   Admitting Physician David Manuel Ortiz, MD ° °Outpatient Primary MD for the patient is [facility] ° °LOS - 4 ° ° °Chief Complaint  °Patient presents with  °• Foot Pain  °    ° ° Subjective:  ° ° Wesley Ingram today has no fevers, no emesis,  No chest pain,  --history of ongoing diarrhea PTA, stools here appears to be loose but not watery ° ° Assessment  & Plan :  ° ° Principal Problem: °  Diabetic foot ulcer (HCC) °Active Problems: °  Type 2 diabetes mellitus (HCC) °  HTN (hypertension) °  Hyperlipidemia °  Anemia ° °Brief summary °72 y.o. male with medical history significant of type 2 diabetes, hypertriglyceridemia, hypertension, renal disorder, history of CVA with residual left-sided hemiparesis admitted on 01/17/2019 with right foot infected diabetic ulcer and osteomyelitis, °-Transfer from [facility] to [place] on 01/22/2019 for further vascular surgery intervention ° Discussed with Dr. Brabham--plans to do an arteriogram on 01/23/2019 ° °A/p ° ° Right great toe infected diabetic  ulcer with osteomyelitis- °--continue IV Vanco, Flagyl and Rocephin (started 01/17/2019) °-MRI of right foot consistent with osteomyelitis  °-Vascular studies showed normal ABIs  °-continue local wound care. °-Trend ESR, CRP,  °Follow-up blood culture and sensitivity. °-Transfer to [place] on 01/22/2019 for further vascular surgery intervention ° Discussed with Dr. Brabham--plans to do an arteriogram on 01/23/2019 °--Hold off on official surgical consultation until after arteriogram on 01/23/2019 ° °  Type 2 diabetes mellitus (HCC) °-A1c 6.3 reflecting excellent diabetic control °Carbohydrate modified diet. °Borderline low blood sugars so decreased glipizide to 2.5 mg p.o. twice daily. °Continue to hold metformin-given possible need  for contrast studies and vancomycin use with risk for nephrotoxicity- °Use Novolog/Humalog Sliding scale insulin with Accu-Cheks/Fingersticks as ordered °   °  HTN (hypertension) °-Stable, °Continue amlodipine 2.5 mg p.o. daily. °Hold losartan --given possible need for contrast studies and vancomycin use with risk for nephrotoxicity °Continue metoprolol 12.5 mg p.o. daily. °Monitor BP, HR, renal function electrolytes. °  °  Hyperlipidemia °Continue atorvastatin 20 mg p.o. daily. ° °  Chronic Anemia °Monitor H&H. ° °--History of prior CVA/chronic anticoagulation °-Continue Eliquis ° °--H/o DVT--- c/n Eliquis ° °Social/Ethics--CODE STATUS discussed with patient again, he is a full code  ° °Diarrhea--- apparently patient has been having diarrhea lately few months prior to admission, was seen by GI as outpatient on 01/16/2019--- gastroenterologist at that time requested to for C. difficile and stool for GI pathogen to rule out infectious etiology prior to embarking on colonoscopy °--[facility] as needed Imodium °-   stools here appears to be loose but not watery.. Stool sample is not acceptable for C. difficile testing, stool GI pathogen pending ° °DVT prophylaxis: On apixaban. °Code Status: Full code °Family Communication: °Disposition Plan:  TBD  ° °Disposition/Need for in-Hospital Stay- patient unable to be discharged at this time due to need for IV antibiotics for osteomyelitis may need surgical intervention ° °Code Status : full ° °Family Communication:   NA (patient is alert, awake and coherent) °Discussed with patient's wife by phone ° °Disposition Plan  : TBD ° °  °                                         ° ° Patient Demographics:  ° ° Wesley Ingram, is a 72 y.o. male, DOB - 05/12/1946, MRN:[id number] ° °Admit date - 01/17/2019   Admitting Physician David Manuel Ortiz, MD ° °Outpatient Primary MD for the patient is [facility] ° °LOS - 4 ° ° °Chief Complaint  °Patient presents with  °• Foot Pain  °    ° ° Subjective:  ° ° Wesley Ingram today has no fevers, no emesis,  No chest pain,  --history of ongoing diarrhea PTA, stools here appears to be loose but not watery ° ° Assessment  & Plan :  ° ° Principal Problem: °  Diabetic foot ulcer (HCC) °Active Problems: °  Type 2 diabetes mellitus (HCC) °  HTN (hypertension) °  Hyperlipidemia °  Anemia ° °Brief summary °72 y.o. male with medical history significant of type 2 diabetes, hypertriglyceridemia, hypertension, renal disorder, history of CVA with residual left-sided hemiparesis admitted on 01/17/2019 with right foot infected diabetic ulcer and osteomyelitis, °-Transfer from [facility] to [place] on 01/22/2019 for further vascular surgery intervention ° Discussed with Dr. Brabham--plans to do an arteriogram on 01/23/2019 ° °A/p ° ° Right great toe infected diabetic  ulcer with osteomyelitis- °--continue IV Vanco, Flagyl and Rocephin (started 01/17/2019) °-MRI of right foot consistent with osteomyelitis  °-Vascular studies showed normal ABIs  °-continue local wound care. °-Trend ESR, CRP,  °Follow-up blood culture and sensitivity. °-Transfer to [place] on 01/22/2019 for further vascular surgery intervention ° Discussed with Dr. Brabham--plans to do an arteriogram on 01/23/2019 °--Hold off on official surgical consultation until after arteriogram on 01/23/2019 ° °  Type 2 diabetes mellitus (HCC) °-A1c 6.3 reflecting excellent diabetic control °Carbohydrate modified diet. °Borderline low blood sugars so decreased glipizide to 2.5 mg p.o. twice daily. °Continue to hold metformin-given possible need  for contrast studies and vancomycin use with risk for nephrotoxicity- °Use Novolog/Humalog Sliding scale insulin with Accu-Cheks/Fingersticks as ordered °   °  HTN (hypertension) °-Stable, °Continue amlodipine 2.5 mg p.o. daily. °Hold losartan --given possible need for contrast studies and vancomycin use with risk for nephrotoxicity °Continue metoprolol 12.5 mg p.o. daily. °Monitor BP, HR, renal function electrolytes. °  °  Hyperlipidemia °Continue atorvastatin 20 mg p.o. daily. ° °  Chronic Anemia °Monitor H&H. ° °--History of prior CVA/chronic anticoagulation °-Continue Eliquis ° °--H/o DVT--- c/n Eliquis ° °Social/Ethics--CODE STATUS discussed with patient again, he is a full code  ° °Diarrhea--- apparently patient has been having diarrhea lately few months prior to admission, was seen by GI as outpatient on 01/16/2019--- gastroenterologist at that time requested to for C. difficile and stool for GI pathogen to rule out infectious etiology prior to embarking on colonoscopy °--[facility] as needed Imodium °-   stools here appears to be loose but not watery.. Stool sample is not acceptable for C. difficile testing, stool GI pathogen pending ° °DVT prophylaxis: On apixaban. °Code Status: Full code °Family Communication: °Disposition Plan:  TBD  ° °Disposition/Need for in-Hospital Stay- patient unable to be discharged at this time due to need for IV antibiotics for osteomyelitis may need surgical intervention ° °Code Status : full ° °Family Communication:   NA (patient is alert, awake and coherent) °Discussed with patient's wife by phone ° °Disposition Plan  : TBD ° °  °                                         ° ° Patient Demographics:  ° ° Wesley Ingram, is a 72 y.o. male, DOB - 01/03/1946, MRN:[id number] ° °Admit date - 01/17/2019   Admitting Physician David Manuel Ortiz, MD ° °Outpatient Primary MD for the patient is [facility] ° °LOS - 4 ° ° °Chief Complaint  °Patient presents with  °• Foot Pain  °    ° ° Subjective:  ° ° Wesley Ingram today has no fevers, no emesis,  No chest pain,  --history of ongoing diarrhea PTA, stools here appears to be loose but not watery ° ° Assessment  & Plan :  ° ° Principal Problem: °  Diabetic foot ulcer (HCC) °Active Problems: °  Type 2 diabetes mellitus (HCC) °  HTN (hypertension) °  Hyperlipidemia °  Anemia ° °Brief summary °72 y.o. male with medical history significant of type 2 diabetes, hypertriglyceridemia, hypertension, renal disorder, history of CVA with residual left-sided hemiparesis admitted on 01/17/2019 with right foot infected diabetic ulcer and osteomyelitis, °-Transfer from [facility] to [place] on 01/22/2019 for further vascular surgery intervention ° Discussed with Dr. Brabham--plans to do an arteriogram on 01/23/2019 ° °A/p ° ° Right great toe infected diabetic  ulcer with osteomyelitis- °--continue IV Vanco, Flagyl and Rocephin (started 01/17/2019) °-MRI of right foot consistent with osteomyelitis  °-Vascular studies showed normal ABIs  °-continue local wound care. °-Trend ESR, CRP,  °Follow-up blood culture and sensitivity. °-Transfer to [place] on 01/22/2019 for further vascular surgery intervention ° Discussed with Dr. Brabham--plans to do an arteriogram on 01/23/2019 °--Hold off on official surgical consultation until after arteriogram on 01/23/2019 ° °  Type 2 diabetes mellitus (HCC) °-A1c 6.3 reflecting excellent diabetic control °Carbohydrate modified diet. °Borderline low blood sugars so decreased glipizide to 2.5 mg p.o. twice daily. °Continue to hold metformin-given possible need  for contrast studies and vancomycin use with risk for nephrotoxicity- °Use Novolog/Humalog Sliding scale insulin with Accu-Cheks/Fingersticks as ordered °   °  HTN (hypertension) °-Stable, °Continue amlodipine 2.5 mg p.o. daily. °Hold losartan --given possible need for contrast studies and vancomycin use with risk for nephrotoxicity °Continue metoprolol 12.5 mg p.o. daily. °Monitor BP, HR, renal function electrolytes. °  °  Hyperlipidemia °Continue atorvastatin 20 mg p.o. daily. ° °  Chronic Anemia °Monitor H&H. ° °--History of prior CVA/chronic anticoagulation °-Continue Eliquis ° °--H/o DVT--- c/n Eliquis ° °Social/Ethics--CODE STATUS discussed with patient again, he is a full code  ° °Diarrhea--- apparently patient has been having diarrhea lately few months prior to admission, was seen by GI as outpatient on 01/16/2019--- gastroenterologist at that time requested to for C. difficile and stool for GI pathogen to rule out infectious etiology prior to embarking on colonoscopy °--[facility] as needed Imodium °-   stools here appears to be loose but not watery.. Stool sample is not acceptable for C. difficile testing, stool GI pathogen pending ° °DVT prophylaxis: On apixaban. °Code Status: Full code °Family Communication: °Disposition Plan:  TBD  ° °Disposition/Need for in-Hospital Stay- patient unable to be discharged at this time due to need for IV antibiotics for osteomyelitis may need surgical intervention ° °Code Status : full ° °Family Communication:   NA (patient is alert, awake and coherent) °Discussed with patient's wife by phone ° °Disposition Plan  : TBD ° °  °                                         ° ° Patient Demographics:  ° ° Wesley Ingram, is a 72 y.o. male, DOB - 05/12/1946, MRN:[id number] ° °Admit date - 01/17/2019   Admitting Physician David Manuel Ortiz, MD ° °Outpatient Primary MD for the [facility] is [facility] ° °LOS - 4 ° ° °Chief Complaint  °Patient presents with  °• Foot Pain  °    ° ° Subjective:  ° ° Wesley Ingram today has no fevers, no emesis,  No chest pain,  --history of ongoing diarrhea PTA, stools here appears to be loose but not watery ° ° Assessment  & Plan :  ° ° Principal Problem: °  Diabetic foot ulcer (HCC) °Active Problems: °  Type 2 diabetes mellitus (HCC) °  HTN (hypertension) °  Hyperlipidemia °  Anemia ° °Brief summary °72 y.o. male with medical history significant of type 2 diabetes, hypertriglyceridemia, hypertension, renal disorder, history of CVA with residual left-sided hemiparesis admitted on 01/17/2019 with right foot infected diabetic ulcer and osteomyelitis, °-Transfer from [facility] [place] on 01/22/2019 for further vascular surgery intervention ° Discussed with Dr. Brabham--plans to do an arteriogram on 01/23/2019 ° °A/p ° ° Right great toe infected diabetic  ulcer with osteomyelitis- °--continue IV Vanco, Flagyl and Rocephin (started 01/17/2019) °-MRI of right foot consistent with osteomyelitis  °-Vascular studies showed normal ABIs  °-continue local wound care. °-Trend ESR, CRP,  °Follow-up blood culture and sensitivity. °-Transfer to [place] on 01/22/2019 for further vascular surgery intervention ° Discussed with Dr. Brabham--plans to do an arteriogram on 01/23/2019 °--Hold off on official surgical consultation until after arteriogram on 01/23/2019 ° °  Type 2 diabetes mellitus (HCC) °-A1c 6.3 reflecting excellent diabetic control °Carbohydrate modified diet. °Borderline low blood sugars so decreased glipizide to 2.5 mg p.o. twice daily. °Continue to hold metformin-given possible need  for contrast studies and vancomycin use with risk for nephrotoxicity- °Use Novolog/Humalog Sliding scale insulin with Accu-Cheks/Fingersticks as ordered °   °  HTN (hypertension) °-Stable, °Continue amlodipine 2.5 mg p.o. daily. °Hold losartan --given possible need for contrast studies and vancomycin use with risk for nephrotoxicity °Continue metoprolol 12.5 mg p.o. daily. °Monitor BP, HR, renal function electrolytes. °  °  Hyperlipidemia °Continue atorvastatin 20 mg p.o. daily. ° °  Chronic Anemia °Monitor H&H. ° °--History of prior CVA/chronic anticoagulation °-Continue Eliquis ° °--H/o DVT--- c/n Eliquis ° °Social/Ethics--CODE STATUS discussed with patient again, he is a full code  ° °Diarrhea--- apparently patient has been having diarrhea lately few months prior to admission, was seen by GI as outpatient on 01/16/2019--- gastroenterologist at that time requested to for C. difficile and stool for GI pathogen to rule out infectious etiology prior to embarking on colonoscopy °--[facility] recommended as needed Imodium °-   stools here appears to be loose but not watery.. Stool sample is not acceptable for C. difficile testing, stool GI pathogen pending ° °DVT prophylaxis: On apixaban. °Code Status: Full code °Family Communication: °Disposition Plan:  TBD  ° °Disposition/Need for in-Hospital Stay- patient unable to be discharged at this time due to need for IV antibiotics for osteomyelitis may need surgical intervention ° °Code Status : full ° °Family Communication:   NA (patient is alert, awake and coherent) °Discussed with patient's wife by phone ° °Disposition Plan  : TBD ° °

## 2019-01-22 ENCOUNTER — Other Ambulatory Visit: Payer: Self-pay | Admitting: *Deleted

## 2019-01-22 ENCOUNTER — Encounter: Payer: Self-pay | Admitting: Gastroenterology

## 2019-01-22 LAB — CULTURE, BLOOD (ROUTINE X 2)
Culture: NO GROWTH
Culture: NO GROWTH
Special Requests: ADEQUATE
Special Requests: ADEQUATE

## 2019-01-22 LAB — BASIC METABOLIC PANEL
Anion gap: 8 (ref 5–15)
BUN: 32 mg/dL — ABNORMAL HIGH (ref 8–23)
CO2: 24 mmol/L (ref 22–32)
Calcium: 8.9 mg/dL (ref 8.9–10.3)
Chloride: 106 mmol/L (ref 98–111)
Creatinine, Ser: 1.07 mg/dL (ref 0.61–1.24)
GFR calc Af Amer: >60 mL/min (ref >60)
GFR calc non Af Amer: >60 mL/min (ref >60)
Glucose, Bld: 129 mg/dL — ABNORMAL HIGH (ref 70–99)
Potassium: 4.2 mmol/L (ref 3.5–5.1)
Sodium: 138 mmol/L (ref 135–145)

## 2019-01-22 LAB — GASTROINTESTINAL PANEL BY PCR, STOOL (REPLACES STOOL CULTURE)

## 2019-01-22 LAB — GLUCOSE, CAPILLARY
Glucose-Capillary: 148 mg/dL — ABNORMAL HIGH (ref 70–99)
Glucose-Capillary: 177 mg/dL — ABNORMAL HIGH (ref 70–99)
Glucose-Capillary: 67 mg/dL — ABNORMAL LOW (ref 70–99)
Glucose-Capillary: 99 mg/dL (ref 70–99)
Glucose-Capillary: 170 mg/dL — ABNORMAL HIGH (ref 70–99)

## 2019-01-22 MED ORDER — APIXABAN 5 MG PO TABS: 5.0000 mg | ORAL_TABLET | Freq: Two times a day (BID) | ORAL | Status: DC

## 2019-01-22 NOTE — Progress Notes (Addendum)
Patient Demographics:    Wesley Ingram, is a 73 y.o. male, DOB - [birth year], WUJ:[id number]  Admit date - 01/17/2019   Admitting Physician Bobette Mo, MD  Outpatient Primary [facility] for the [facility] is [facility]  LOS - 5   Chief Complaint  Patient presents with  . Foot Pain        Subjective:    Wesley Ingram today has no fevers, no emesis,  No chest pain,  --history of ongoing diarrhea PTA, stools here appears to be loose but not watery   Assessment  & Plan :    Principal Problem:   Diabetic foot ulcer (HCC) Active Problems:   Type 2 diabetes mellitus (HCC)   HTN (hypertension)   Hyperlipidemia   Anemia  Brief summary 73 y.o. male with medical history significant of type 2 diabetes, hypertriglyceridemia, hypertension, renal disorder, history of CVA with residual left-sided hemiparesis admitted on 01/17/2019 with right foot infected diabetic ulcer and osteomyelitis, -Transfer from [facility] to [facility] on 01/22/2019 for further vascular surgery intervention Discussed with Dr. Seward Meth to do an arteriogram on 01/23/2019  A/p  Right great toe infected diabetic  ulcer with osteomyelitis- --continue IV Vanco, Flagyl and Rocephin (started 01/17/2019) -MRI of right foot consistent with osteomyelitis  -Vascular studies showed normal ABIs  -continue local wound care. -Trend ESR, CRP,  -Blood cultures NGTD -MRSA PCR negative, consider discontinuing IV vancomycin -Transfer to [facility] on 01/22/2019 for further vascular surgery intervention -Per Dr Myra Gianotti hold Eliquis to allow for arteriogram (last dose am of 01/22/19) Discussed with Dr. Seward Meth to do an arteriogram on 01/23/2019 --Hold off on official surgical consultation until after arteriogram on 01/23/2019 -Please get podiatry Versus Dr. Audrie Lia consult after vascular surgery procedure on 01/23/2019   Type 2 Diabetes Mellitus  (HCC) -A1c 6.3 reflecting excellent diabetic control Carbohydrate modified diet. Borderline low blood sugars so decreased glipizide to 2.5 mg p.o. twice daily. Continue to hold metformin-given possible need for contrast studies and vancomycin use with risk for nephrotoxicity- Use Novolog/Humalog Sliding scale insulin with Accu-Cheks/Fingersticks as ordered     HTN (hypertension) -Stable, Continue Amlodipine 2.5 mg p.o. daily. Hold losartan --given possible need for contrast studies and vancomycin use with risk for nephrotoxicity Continue Metoprolol 12.5 mg p.o. daily. Monitor BP, HR, renal function electrolytes.    Hyperlipidemia Continue atorvastatin 20 mg p.o. daily.   Chronic Anemia Monitor H&H.  --History of prior CVA/chronic anticoagulation -Continue Eliquis  --H/o DVT--- c/n Eliquis  Social/Ethics--CODE STATUS discussed with patient again, he is a full code   Diarrhea--- apparently patient has been having diarrhea lately few months prior to admission, was seen by GI as outpatient on 01/16/2019--- gastroenterologist at that time requested to for C. difficile and stool for GI pathogen to rule out infectious etiology prior to embarking on colonoscopy --[facility] as needed Imodium -  stools here appears to be loose but not watery.. Stool sample is not acceptable for C. difficile testing, stool GI pathogen pending  DVT prophylaxis: On apixaban. Code Status: Full code Family Communication: Disposition Plan:  Transfer to [facility] on 01/22/2019  Disposition/Need for in-Hospital Stay- patient unable to be discharged at this time due to need for IV antibiotics for osteomyelitis  may need surgical intervention  Code Status : full  Family Communication:   (patient is alert, awake and coherent) Discussed with patient's wife by phone  Disposition Plan  : TBD awaiting vascular and podiatry/foot orthopedic input   consults  : Vascular surgery/Dr Myra Gianotti   DVT Prophylaxis  : Eliquis Lab Results  Component Value Date   PLT 350 01/21/2019    Inpatient Medications  Scheduled Meds: . amLODipine  2.5 mg Oral Daily  . apixaban  5 mg Oral BID  . atorvastatin  20 mg Oral q morning - 10a  . feeding supplement (PRO-STAT SUGAR FREE 64)  30 mL Oral BID  . glipiZIDE  2.5 mg Oral BID AC  . insulin aspart  0-15 Units Subcutaneous TID WC  . metoprolol succinate  12.5 mg Oral Daily  . metroNIDAZOLE  500 mg Oral Q8H  . nutrition supplement (JUVEN)  1 packet Oral BID BM   Continuous Infusions: . sodium chloride 10 mL/hr at 01/22/19 0233  . cefTRIAXone (ROCEPHIN)  IV 2 g (01/21/19 2210)  . vancomycin Stopped (01/22/19 0214)   PRN Meds:.sodium chloride, acetaminophen **OR** acetaminophen, loperamide    Anti-infectives (From admission, onward)   Start     Dose/Rate Route Frequency Ordered Stop   01/18/19 1000  vancomycin (VANCOCIN) IVPB 750 mg/150 ml premix     750 mg 150 mL/hr over 60 Minutes Intravenous Every 12 hours 01/18/19 0847     01/18/19 0800  vancomycin (VANCOCIN) IVPB 750 mg/150 ml premix  Status:  Discontinued     750 mg 150 mL/hr over 60 Minutes Intravenous Every 12 hours 01/17/19 2235 01/18/19 0847   01/17/19 2230  cefTRIAXone (ROCEPHIN) 2 g in sodium chloride 0.9 % 100 mL IVPB     2 g 200 mL/hr over 30 Minutes Intravenous Every 24 hours 01/17/19 2224     01/17/19 2230  metroNIDAZOLE (FLAGYL) tablet 500 mg     500 mg Oral Every 8 hours 01/17/19 2224     01/17/19 2215  vancomycin (VANCOCIN) IVPB 1000 mg/200 mL premix     1,000 mg 200 mL/hr over 60 Minutes Intravenous  Once 01/17/19 2208 01/17/19 2340        Objective:   Vitals:   01/21/19 1400 01/21/19 2037 01/21/19 2214 01/22/19 0546  BP: 126/75  114/65 121/67  Pulse: 73  70 71  Resp: 18  20 16   Temp: 98.4 F (36.9 C)  98.3 F (36.8 C) 98 F (36.7 C)  TempSrc: Oral  Oral Oral  SpO2: 96% 96% 98% 95%  Weight:      Height:        Wt Readings from Last 3 Encounters:   01/18/19 73.6 kg  01/16/19 72.6 kg  12/30/18 74.8 kg    Intake/Output Summary (Last 24 hours) at 01/22/2019 1036 Last data filed at 01/22/2019 0500 Gross per 24 hour  Intake 1060.38 ml  Output 3700 ml  Net -2639.62 ml   Physical Exam Gen:- Awake Alert,  In no apparent distress  HEENT:- [place].AT, No sclera icterus Neck-Supple Neck,No JVD,.  Lungs-  CTAB , fair symmetrical air movement CV- S1, S2 normal, regular  Abd-  +ve B.Sounds, Abd Soft, No tenderness,    Extremity--pedal pulses present  Psych-affect is appropriate, oriented x3 Neuro-Chronic Lt sided Hemiparesis, but no new focal deficits, no tremors MSK--right great toe findings as noted in photos in epic  Data Review:   Micro Results Recent Results (from the past 240 hour(s))  Blood culture (routine x 2)  Status: None   Collection Time: 01/17/19  8:48 PM   Specimen: BLOOD RIGHT ARM  Result Value Ref Range Status   Specimen Description BLOOD RIGHT ARM  Final   Special Requests   Final    BOTTLES DRAWN AEROBIC AND ANAEROBIC Blood Culture adequate volume   Culture   Final    NO GROWTH 5 DAYS Performed at [facility], [address], [place], [place] [address]    Report Status 01/22/2019 FINAL  Final  Blood culture (routine x 2)     Status: None   Collection Time: 01/17/19  8:57 PM   Specimen: BLOOD RIGHT HAND  Result Value Ref Range Status   Specimen Description BLOOD RIGHT HAND  Final   Special Requests   Final    BOTTLES DRAWN AEROBIC AND ANAEROBIC Blood Culture adequate volume   Culture   Final    NO GROWTH 5 DAYS Performed at [facility], [address], [place], [place] [address]    Report Status 01/22/2019 FINAL  Final  SARS CORONAVIRUS 2 Nasal Swab Aptima Multi Swab     Status: None   Collection Time: 01/18/19 12:39 AM   Specimen: Aptima Multi Swab; Nasal Swab  Result Value Ref Range Status   SARS Coronavirus 2 NEGATIVE NEGATIVE Final    Comment: (NOTE) SARS-CoV-2 target nucleic acids are NOT  DETECTED. The SARS-CoV-2 RNA is generally detectable in upper and lower respiratory specimens during the acute phase of infection. Negative results do not preclude SARS-CoV-2 infection, do not rule out co-infections with other pathogens, and should not be used as the sole basis for treatment or other patient management decisions. Negative results must be combined with clinical observations, patient history, and epidemiological information. The expected result is Negative. Fact Sheet for Patients: HairSlick.no Fact Sheet for Healthcare Providers: quierodirigir.com This test is not yet approved or cleared by the [place] FDA and  has been authorized for detection and/or diagnosis of SARS-CoV-2 by FDA under an Emergency Use Authorization (EUA). This EUA will remain  in effect (meaning this test can be used) for the duration of the COVID-19 declaration under Section 56 4(b)(1) of the Act, 21 U.S.C. section 360bbb-3(b)(1), unless the authorization is terminated or revoked sooner. Performed at [facility], [address] [place], [place] [address]   MRSA PCR Screening     Status: None   Collection Time: 01/19/19  6:38 AM   Specimen: Nasal Mucosa; Nasopharyngeal  Result Value Ref Range Status   MRSA by PCR NEGATIVE NEGATIVE Final    Comment:        The GeneXpert MRSA Assay (FDA approved for NASAL specimens only), is one component of a comprehensive MRSA colonization surveillance program. It is not intended to diagnose MRSA infection nor to guide or monitor treatment for MRSA infections. Performed at [facility], [address], [place], [place] [address]     Radiology Reports Mr Foot Right Wo Contrast  Result Date: 01/18/2019 CLINICAL DATA:  Diabetic patient with an open wound on the right great toe. EXAM: MRI OF THE RIGHT FOREFOOT WITHOUT CONTRAST TECHNIQUE: Multiplanar, multisequence MR imaging of the right  forefoot was performed. No intravenous contrast was administered. COMPARISON:  Plain films right foot 01/17/2019. FINDINGS: Bones/Joint/Cartilage Motion degrades the examination. There is marrow edema throughout the distal phalanx of the great toe consistent with osteomyelitis. No other evidence of osteomyelitis is identified. Ligaments Intact. Muscles and Tendons Intact. No intramuscular fluid collection. 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 Soft tissues Skin wound at the distal aspect of the great toe is identified. No abscess is seen. IMPRESSION: Motion degraded examination demonstrates marrow edema throughout the distal phalanx of the great toe consistent with osteomyelitis. No abscess is identified. No evidence of septic joint. Electronically Signed   By: Drusilla Kanner M.D.   On: 01/18/2019 07:35   US Arterial Abi (screening Lower Extremity)  Result Date: 01/18/2019 CLINICAL DATA:  Right diabetic open foot wound, great toe. Previous amputation. EXAM: NONINVASIVE PHYSIOLOGIC VASCULAR STUDY OF BILATERAL LOWER EXTREMITIES TECHNIQUE: Evaluation of both lower extremities were performed at rest, including calculation of ankle-brachial indices with single level Doppler, pressure and pulse volume recording. COMPARISON:  None. FINDINGS: Right ABI:  1.28 Left ABI:  1.16 Right Lower Extremity:  Multiphasic waveform at the ankle. Left Lower Extremity:  Multiphasic waveform at the ankle. IMPRESSION: Normal bilateral ABIs. Vascular calcification and resultant limited compressibility in the setting of diabetes can result in elevated values however. Electronically Signed   By: Corlis Leak M.D.   On: 01/18/2019 09:50   Dg Foot Complete Right  Result Date: 01/17/2019 CLINICAL DATA:  Swelling and drainage from right great toe. History of diabetes. EXAM: RIGHT FOOT COMPLETE - 3+ VIEW COMPARISON:  Radiographs 12/30/2018 FINDINGS: No acute fracture or  traumatic malalignment. There is soft tissue swelling and ulceration at the tip of the first digit. No subcutaneous gas few punctate radiodensities are present within the soft tissues which are new from prior exam and could reflect debris or foreign body. No bony erosion, destructive change or immature periostitis to suggest early radiographic features of osteomyelitis. 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 Hammertoe deformities of the third and fourth digits. May mild mid foot arthrosis is present as well. Please note alignment of the mid and hindfoot is incompletely assessed on nonweightbearing radiographs. Calcaneal spurs are present at the Achilles and plantar ligament insertions. IMPRESSION: 1. Soft tissue swelling and ulceration at the tip of the first digit. Few punctate radiodensities within the soft tissues are new from prior exam and could reflect debris or foreign body and should be correlated with visual inspection. 2. No radiographic evidence of osteomyelitis. MRI is more sensitive and specific for early findings of osteomyelitis. Electronically Signed   By: Kreg Shropshire M.D.   On: 01/17/2019 21:41   Dg Foot Complete Right  Result Date: 12/30/2018 CLINICAL DATA:  Infection, right great toe EXAM: RIGHT FOOT COMPLETE - 3+ VIEW COMPARISON:  None. FINDINGS: No fracture or dislocation of the right foot. No bony erosion or sclerosis of the great toe to suggest osteomyelitis. There is moderate first metatarsophalangeal arthrosis. Mild midfoot arthrosis. The remaining joint spaces are well preserved. Mild soft tissue edema of the great toe. IMPRESSION: No fracture or dislocation of the right foot. No bony erosion or sclerosis of the great toe to suggest osteomyelitis. There is moderate first metatarsophalangeal arthrosis. Mild midfoot arthrosis. The remaining joint spaces are well preserved. Mild soft tissue edema of the great toe. MRI may be used to more sensitively evaluate  for bone marrow edema and osteomyelitis if suspected. Electronically Signed   By: Lauralyn Primes M.D.   On: 12/30/2018 13:10    CBC Recent Labs  Lab 01/17/19 2057 01/18/19 0408 01/19/19 0507 01/20/19 0604 01/21/19 0611  WBC 11.8* 10.9* 13.2* 10.0 9.3  HGB 11.7* 10.7* 11.4* 11.0* 11.8*  HCT 36.0* 33.1* 36.2* 35.0* 37.2*  PLT 364 308 344 345 350  MCV 89.8 91.2 92.3 90.7 91.6  MCH 29.2 29.5 29.1 28.5 29.1  MCHC 32.5 32.3 31.5 31.4 31.7  RDW 13.0 12.9 12.8 13.0 12.9  LYMPHSABS 2.2 2.0 1.3 1.8 1.8  MONOABS 1.4* 1.4* 1.6* 1.3* 1.3*  EOSABS 0.4 0.4 0.5 0.5 0.6*  BASOSABS 0.1 0.1 0.1 0.1 0.1    Chemistries  Recent Labs  Lab 01/17/19 2057 01/19/19 0507 01/20/19 0604 01/22/19 0520  NA 133*  --  138 138  K 4.4  --  3.8 4.2  CL 102  --  105 106  CO2 22  --  26 24  GLUCOSE 181*  --  162* 129*  BUN 37*  --  31* 32*  CREATININE 1.21 1.11 1.15 1.07  CALCIUM 8.9  --  8.9 8.9   ------------------------------------------------------------------------------------------------------------------ No results for input(s): CHOL, HDL, LDLCALC, TRIG, CHOLHDL, LDLDIRECT in the last 72 hours.  Lab Results  Component Value Date   HGBA1C 6.3 (H) 01/18/2019   ------------------------------------------------------------------------------------------------------------------ No results for input(s): TSH, T4TOTAL, T3FREE, THYROIDAB in the last 72 hours.  Invalid input(s): FREET3 ------------------------------------------------------------------------------------------------------------------ No results for input(s): VITAMINB12, FOLATE, FERRITIN, TIBC, IRON, RETICCTPCT in the last 72 hours.  Coagulation profile No results for input(s): INR, PROTIME in the last 168 hours.  No results for input(s): DDIMER in the last 72 hours.  Cardiac Enzymes No results for input(s): CKMB, TROPONINI, MYOGLOBIN in the last 168 hours.  Invalid input(s): CK  ------------------------------------------------------------------------------------------------------------------ No results found for: BNP  Shon Hale M.D on 01/22/2019 at 10:36 AM  Go to [email or web address] - for contact info  [facility] - Office  [phone]

## 2019-01-22 NOTE — Care Management Important Message (Signed)
Important Message  Patient Details  Name: Wesley Ingram MRN: [id number] Date of Birth: 10-13-1945   Medicare Important Message Given:  Yes     Tommy Medal 01/22/2019, 2:34 PM

## 2019-01-22 NOTE — Progress Notes (Signed)
Patient admitted to unit from [facility], [facility]. Patient settled in bed and oriented to unit and hospital routine. Patient incontinent of stool, cleaned and peri care provided. Right toe dressing removed, cleansed with NS and xeroform applied per orders. Patient tolerated well. Pt resting comfortably in bed with call bell in reach and side rails up. Instructed patient to utilize call bell for assistance. Will continue to monitor closely for remainder of shift.

## 2019-01-22 NOTE — Progress Notes (Signed)
Report called to nurse on 6N at [facility]. Message left on voicemail for pt's wife regarding transfer.  Deirdre Pippins, RN

## 2019-01-23 DIAGNOSIS — L97509 Non-pressure chronic ulcer of other part of unspecified foot with unspecified severity: Secondary | ICD-10-CM

## 2019-01-23 DIAGNOSIS — E785 Hyperlipidemia, unspecified: Secondary | ICD-10-CM

## 2019-01-23 DIAGNOSIS — M86671 Other chronic osteomyelitis, right ankle and foot: Secondary | ICD-10-CM

## 2019-01-23 DIAGNOSIS — D649 Anemia, unspecified: Secondary | ICD-10-CM

## 2019-01-23 DIAGNOSIS — E1159 Type 2 diabetes mellitus with other circulatory complications: Secondary | ICD-10-CM

## 2019-01-23 LAB — BASIC METABOLIC PANEL
Anion gap: 11 (ref 5–15)
BUN: 37 mg/dL — ABNORMAL HIGH (ref 8–23)
CO2: 25 mmol/L (ref 22–32)
Calcium: 9 mg/dL (ref 8.9–10.3)
Chloride: 102 mmol/L (ref 98–111)
Creatinine, Ser: 1.32 mg/dL — ABNORMAL HIGH (ref 0.61–1.24)
GFR calc Af Amer: >60 mL/min (ref >60)
GFR calc non Af Amer: 54 mL/min — ABNORMAL LOW (ref >60)
Glucose, Bld: 111 mg/dL — ABNORMAL HIGH (ref 70–99)
Potassium: 4.3 mmol/L (ref 3.5–5.1)
Sodium: 138 mmol/L (ref 135–145)

## 2019-01-23 LAB — CBC
HCT: 36.3 % — ABNORMAL LOW (ref 39.0–52.0)
Hemoglobin: 12 g/dL — ABNORMAL LOW (ref 13.0–17.0)
MCH: 29.6 pg (ref 26.0–34.0)
MCHC: 33.1 g/dL (ref 30.0–36.0)
MCV: 89.4 fL (ref 80.0–100.0)
Platelets: 349 10*3/uL (ref 150–400)
RBC: 4.06 MIL/uL — ABNORMAL LOW (ref 4.22–5.81)
RDW: 12.7 % (ref 11.5–15.5)
WBC: 10 10*3/uL (ref 4.0–10.5)
nRBC: 0 % (ref 0.0–0.2)

## 2019-01-23 LAB — GLUCOSE, CAPILLARY
Glucose-Capillary: 117 mg/dL — ABNORMAL HIGH (ref 70–99)
Glucose-Capillary: 236 mg/dL — ABNORMAL HIGH (ref 70–99)
Glucose-Capillary: 218 mg/dL — ABNORMAL HIGH (ref 70–99)
Glucose-Capillary: 115 mg/dL — ABNORMAL HIGH (ref 70–99)
Glucose-Capillary: 196 mg/dL — ABNORMAL HIGH (ref 70–99)

## 2019-01-23 LAB — C-REACTIVE PROTEIN: CRP: <0.8 mg/dL (ref <1.0)

## 2019-01-23 LAB — SEDIMENTATION RATE: Sed Rate: 28 mm/hr — ABNORMAL HIGH (ref 0–16)

## 2019-01-23 NOTE — Consult Note (Addendum)
[facility] NOTE   MRN : [id number]  Reason for Consult: right GT ulcer with osteomyelitis infection. Referring Physician:  Roxan Hockey M.D  History of Present Illness: 73 y/o male admitted with recent Hx  of right GT non healing ulcer.  The wound has been present for 2 weeks now.  His MRI of right foot consistent with osteomyelitis.  He denise rest pain and Claudication.  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  Past medical history includes left hemiparesis s/p CVA, DM, hypertriglyceridemia, and hypertension.  He is on Eliquis which is being held for pre-procedure.       Current Facility-Administered Medications  Medication Dose Route Frequency Provider Last Rate Last Dose  . 0.9 %  sodium chloride infusion   Intravenous PRN Virl Cagey, MD   Stopped at 01/23/19 0617  . acetaminophen (TYLENOL) tablet 650 mg  650 mg Oral Q6H PRN Reubin Milan, MD       Or  . acetaminophen (TYLENOL) suppository 650 mg  650 mg Rectal Q6H PRN Reubin Milan, MD      . amLODipine [facility]) tablet 2.5 mg  2.5 mg Oral Daily Reubin Milan, MD   2.5 mg at 01/22/19 1003  . [START ON 01/25/2019] apixaban (ELIQUIS) tablet 5 mg  5 mg Oral BID Emokpae, Courage, MD      . atorvastatin (LIPITOR) tablet 20 mg  20 mg Oral q morning - 10a Reubin Milan, MD   20 mg at 01/22/19 1003  . cefTRIAXone (ROCEPHIN) 2 g in sodium chloride 0.9 % 100 mL IVPB  2 g Intravenous Q24H Reubin Milan, MD   Stopped at 01/22/19 2327  . feeding supplement (PRO-STAT SUGAR FREE 64) liquid 30 mL  30 mL Oral BID Denton Brick, Courage, MD   30 mL at 01/22/19 2102  . glipiZIDE (GLUCOTROL) tablet 2.5 mg  2.5 mg Oral BID AC Emokpae, Courage, MD   2.5 mg at 01/23/19 0818  . insulin aspart (novoLOG) injection 0-15 Units  0-15 Units Subcutaneous TID WC Reubin Milan, MD   3 Units at 01/22/19 1314  . loperamide (IMODIUM) capsule 2 mg  2  mg Oral Q12H PRN Roxan Hockey, MD   2 mg at 01/22/19 1003  . metoprolol succinate (TOPROL-XL) 24 hr tablet 12.5 mg  12.5 mg Oral Daily Reubin Milan, MD   12.5 mg at 01/22/19 1003  . metroNIDAZOLE (FLAGYL) tablet 500 mg  500 mg Oral Q8H Reubin Milan, MD   500 mg at 01/23/19 1540  . nutrition supplement (JUVEN) (JUVEN) powder packet 1 packet  1 packet Oral BID BM Roxan Hockey, MD   1 packet at 01/22/19 1355  . vancomycin (VANCOCIN) IVPB 750 mg/150 ml premix  750 mg Intravenous Q12H Roxan Hockey, MD   Stopped at 01/22/19 2243    Pt meds include: Statin :Yes Betablocker: Yes ASA: No Other anticoagulants/antiplatelets: Eliquis  Past Medical History:  Diagnosis Date  . Diabetes mellitus   . DVT (deep venous thrombosis) ([place])   . Foot ulcer due to secondary DM ([place])   . High triglycerides   . Hypertension   . PVD (peripheral vascular disease) ([place])   . Renal disorder   . Stroke [facility]) 2008   left hemiparesis    Past Surgical History:  Procedure Laterality Date  . COLONOSCOPY  2009   [place] in [place]: diverticulosis, next TCS in  07/2017  . CYSTOSCOPY WITH RETROGRADE PYELOGRAM, URETEROSCOPY AND STENT PLACEMENT Right 11/19/2016   Procedure: CYSTOSCOPY WITH RETROGRADE PYELOGRAM, URETEROSCOPY AND STENT EXCHANGE;  Surgeon: Alexis Frock, MD;  Location: [facility];  Service: Urology;  Laterality: Right;  . CYSTOSCOPY WITH STENT PLACEMENT Right 10/19/2016   Procedure: CYSTOSCOPY WITH STENT PLACEMENT;  Surgeon: Alexis Frock, MD;  Location: [facility];  Service: Urology;  Laterality: Right;  . ENDARTERECTOMY     Left popliteal artery endarterectomy with left above-knee popliteal artery to tibial peroneal trunk bypass with reverse saphenous vein  . HERNIA REPAIR     bilateral inguinal  . HOLMIUM LASER APPLICATION Right 07/15/9756   Procedure: HOLMIUM LASER APPLICATION;  Surgeon: Alexis Frock, MD;  Location: [facility];  Service: Urology;  Laterality: Right;  . TOE  AMPUTATION    . TONSILLECTOMY      Social History Social History   Tobacco Use  . Smoking status: Never Smoker  . Smokeless tobacco: Never Used  Substance Use Topics  . Alcohol use: No  . Drug use: No    Family History Family History  Problem Relation Age of Onset  . Multiple myeloma Mother 74  . Colon cancer Neg Hx     No Known Allergies   REVIEW OF SYSTEMS  General: _0  Weight loss, _1  Fever, _2  chills Neurologic: _3  Dizziness, _4  Blackouts, _5  Seizure _6  Stroke, _7  "Mini stroke", _8  Slurred speech, _9  Temporary blindness; _10  weakness in arms or legs, _11  Hoarseness _12  Dysphagia Cardiac: _13  Chest pain/pressure, _14  Shortness of breath at rest _15  Shortness of breath with exertion, _16  Atrial fibrillation or irregular heartbeat  Vascular: _17  Pain in legs with walking, _18  Pain in legs at rest, _19  Pain in legs at night,  _20  Non-healing ulcer, _21  Blood clot in vein/DVT,   Pulmonary: _22  Home oxygen, _23  Productive cough, _24  Coughing up blood, _25  Asthma,  _26  Wheezing _27  COPD Musculoskeletal:  _28  Arthritis, _29  Low back pain, _30  Joint pain Hematologic: _31  Easy Bruising, _32  Anemia; _33  Hepatitis Gastrointestinal: _34  Blood in stool, _35  Gastroesophageal Reflux/heartburn, Urinary: _36  chronic Kidney disease, _37  on HD - _38  MWF or _39  TTHS, _40  Burning with urination, _41  Difficulty urinating Skin: _42  Rashes, _43  Wounds Psychological: _44  Anxiety, _45  Depression  Physical Examination Vitals:   01/22/19 0546 01/22/19 1714 01/22/19 2007 01/23/19 0459  BP: 121/67 124/73 (!) 146/78 118/71  Pulse: 71 72 73 66  Resp: _46 Temp: 98 F (36.7 C) 98 F (36.7 C) 98.2 F (36.8 C) (!) 97.3 F (36.3 C)  TempSrc: Oral Oral Oral Oral  SpO2: 95% 97% 95% 98%  Weight:  73.3 kg    Height:  _47  (1.778 m)     Body mass index is 23.19 kg/m.  General:  WDWN in NAD Gait: Normal HENT: WNL Eyes: Pupils equal Pulmonary: normal non-labored breathing  , without Rales, rhonchi,  wheezing Cardiac: RRR, without  Murmurs, rubs or gallops; No carotid bruits Abdomen: soft, NT, no masses Skin: no rashes, GT medial tip black eschar ulcer noted  Vascular Exam/Pulses:Palpable radial, femoral and DP/PT pulses   Musculoskeletal: no muscle wasting or atrophy; no edema  Neurologic: A&O X 3; Appropriate Affect ;  SENSATION: normal; MOTOR FUNCTION: 5/5  Symmetric right UE/LE, weakness/paraliasis left UE/LE s/p CVA Speech is fluent/normal   Significant Diagnostic Studies: CBC Lab Results  Component Value Date   WBC 10.0 01/23/2019   HGB 12.0 (L) 01/23/2019   HCT 36.3 (L) 01/23/2019   MCV 89.4 01/23/2019   PLT 349 01/23/2019    BMET    Component Value Date/Time   NA 138 01/23/2019 0456   K 4.3 01/23/2019 0456   CL 102 01/23/2019 0456   CO2 25 01/23/2019 0456   GLUCOSE 111 (H) 01/23/2019 0456   BUN 37 (H) 01/23/2019 0456   CREATININE 1.32 (H) 01/23/2019 0456   CALCIUM 9.0 01/23/2019 0456   GFRNONAA 54 (L) 01/23/2019 0456   GFRAA >60 01/23/2019 0456   Estimated Creatinine Clearance: 53 mL/min (A) (by C-G formula based on SCr of 1.32 mg/dL (H)).  COAG Lab Results  Component Value Date   INR 1.39 10/19/2016     Non-Invasive Vascular Imaging:  MRI IMPRESSION: Motion degraded examination demonstrates marrow edema throughout the distal phalanx of the great toe consistent with osteomyelitis. No abscess is identified. No evidence of septic joint.  ASSESSMENT/PLAN:  Non healing right GT ulcer Plan will be for angiogram with runoff to delineate his ability to heal a wound and or heal a GT amputation.  This is planned for Thursday with Dr. Donzetta Matters.     Roxy Horseman 01/23/2019 9:23 AM   I agree with the above.  I have seen and evaluated the patient.  Briefly this is a 73 year old gentleman with diabetes and a right great toe ulcer.  He was transferred down for possible angiography however on physical examination, he has a  palpable pedal pulse which I can feel out to the origin of his great toe.  Therefore I do not feel that angiography is necessary and we can proceed with a great toe amputation.  This was discussed with the patient and he is in agreement.  I will plan for right great toe amputation on Thursday.  If there are any issues with perfusion, I will consider angiography after the amputation.  Annamarie Major

## 2019-01-23 NOTE — Progress Notes (Signed)
Inpatient Diabetes Program Recommendations  AACE/ADA: New Consensus Statement on Inpatient Glycemic Control (2015)  Target Ranges:  Prepandial:   less than 140 mg/dL      Peak postprandial:   less than 180 mg/dL (1-2 hours)      Critically ill patients:  140 - 180 mg/dL     Review of Glycemic Control Results for Wesley Ingram, Wesley Ingram (MRN [id number]) as of 01/23/2019 12:30  Ref. Range 01/22/2019 08:10 01/22/2019 12:36 01/22/2019 17:28 01/22/2019 17:59 01/22/2019 20:57 01/23/2019 08:08  Glucose-Capillary Latest Ref Range: 70 - 99 mg/dL 148 (H) 177 (H) 67 (L) 99 170 (H) 117 (H)   Diabetes history: DM 2  Current orders for Inpatient glycemic control:  Glipizide 2.5 mg bid Novolog 0-15 units tid  Inpatient Diabetes Program Recommendations:    Hypoglycemia yesterday 67. Patient on Glipizide, a sulfonylurea medication known to cause hypoglycemia. Consider d/cing Glipizide.  Thanks,  Tama Headings RN, MSN, BC-ADM Inpatient Diabetes Coordinator Team Pager [phone] (8a-5p)

## 2019-01-23 NOTE — Progress Notes (Addendum)
Patient Demographics:    Wesley Ingram, is a 73 y.o. male, DOB - 20-Apr-1946, XBM:[id number]  Admit date - 01/17/2019   Admitting Physician Bobette Mo, MD  Outpatient Primary [facility] the [facility]  LOS - 6   Chief Complaint  Patient presents with  . Foot Pain      Brief summary Wesley Ingram is a 73 y.o. male with medical history significant of type 2 diabetes, hypertriglyceridemia, hypertension, renal disorder, history of CVA with residual left-sided hemiparesis who is coming to the emergency department for evaluation of his left big toe that after he was seen earlier by his home health nurse.  He does not know how he acquired this injury, but states that he has had it for at least 2 weeks.  He denies fever and night sweats, but feels chills and fatigue.  No headache, rhinorrhea, sore throat, wheezing, hemoptysis or dyspnea.  Denies chest pain, palpitations, dizziness, diaphoresis, PND, orthopnea or pitting edema of the lower extremities.  Denies abdominal pain, diarrhea, constipation, melena or hematochezia.  No dysuria, frequency or hematuria.  Denies blurred vision, polyuria, polydipsia, polyphagia. In ED initial vital signs were temperature 98.3 F, pulse 67, respiration 18, blood pressure 125/74 mmHg and O2 sat 97% on room air.  He was given vancomycin in the emergency department.  Patient transfered from [facility] to [facility] on 01/22/2019 for further vascular surgery intervention.Discussed with Dr. Seward Meth to do an arteriogram on 01/25/2019  - follow up with podiatry/ortho thereafter pending this evaluation.    Subjective:   No acute issues/events overnight, denies fever, chills, shortness of breath, headaches, nausea, vomiting, diarrhea, constipation.  Patient does indicate right foot pain but notes it is improving from previous on current regimen.    Assessment  & Plan  :    Principal Problem:   Diabetic foot ulcer (HCC) Active Problems:   Type 2 diabetes mellitus (HCC)   HTN (hypertension)   Hyperlipidemia   Anemia  Right great toe osteomyelitis-likely poorly healing diabetic foot ulcer, POA -continue IV Vanco, Flagyl and Rocephin (started 01/17/2019) -MRI of right foot consistent with osteomyelitis  -Vascular studies showed normal ABIs  -continue local wound care. -CRP WNL; ESR 29 (52 last week) -Blood cultures NGTD -Angiogram with runoff to delineate his ability to heal a wound and or heal a GT amputation.  This is planned for Thursday with Dr. Randie Heinz.   -Hold off on official surgical consultation until after arteriogram on 01/25/2019 -Will sideline ID to see if we can de-escalate antibiotics -patient has known history of staph epi and distant blood culture resistant only to Cipro and oxacillin otherwise no notable resistant organisms in micro history. *Update - discussed with ID - will hold abx at this time to improve chances of obtaining viable culture in surgery. Following along. Appreciate insight/recs - likely vancomycin/ceftriaxone post-op until cultures result.   Type 2 Diabetes Mellitus, non-insulin-dependent -A1c 6.3 reflecting diabetes is well controlled -Previously decreased glipizide to 2.5 mg p.o. twice daily -even borderline hypoglycemia on well-controlled diet -Continue to hold metformin-given possible need for contrast with concurrent vancomycin use, in attempt to avoid nephrotoxicity  -Continue sliding scale insulin, hypoglycemic protocol, diabetic diet    HTN (hypertension) -Stable, Continue Amlodipine  2.5 and metoprolol succinate 12.5 Hold losartan as above to avoid nephrotoxicity Monitor BP, HR, renal function electrolytes.  Hyperlipidemia Continue atorvastatin 20 mg p.o. daily.  Chronic Anemia, likely anemia of chronic disease, stable Follow H&H with daily labs  History of prior CVA/chronic anticoagulation History of DVT  Currently holding Eliquis as above -ending procedure  Diarrhea - Appears to be somewhat chronic, follow cultures; following with GI in the outpatient setting  DVT prophylaxis:  Currently holding Eliquis as above Code Status: Full code Disposition Plan:  Further evaluation treatment and clinical improvement of osteomyelitis, tentative disposition will be dependent on patient's ambulatory status and need for prolonged IV antibiotics inpatient versus outpatient setting.  At this time patient continues to require IV antibiotics, surgical evaluation and likely intervention prior to disposition. Consults: Vascular surgery  Inpatient Medications  Scheduled Meds: . amLODipine  2.5 mg Oral Daily  . [START ON 01/25/2019] apixaban  5 mg Oral BID  . atorvastatin  20 mg Oral q morning - 10a  . feeding supplement (PRO-STAT SUGAR FREE 64)  30 mL Oral BID  . glipiZIDE  2.5 mg Oral BID AC  . insulin aspart  0-15 Units Subcutaneous TID WC  . metoprolol succinate  12.5 mg Oral Daily  . metroNIDAZOLE  500 mg Oral Q8H  . nutrition supplement (JUVEN)  1 packet Oral BID BM   Continuous Infusions: . sodium chloride Stopped (01/23/19 0617)  . cefTRIAXone (ROCEPHIN)  IV Stopped (01/22/19 2327)  . vancomycin Stopped (01/22/19 2243)   PRN Meds:.sodium chloride, acetaminophen **OR** acetaminophen, loperamide   Anti-infectives (From admission, onward)   Start     Dose/Rate Route Frequency Ordered Stop   01/18/19 1000  vancomycin (VANCOCIN) IVPB 750 mg/150 ml premix     750 mg 150 mL/hr over 60 Minutes Intravenous Every 12 hours 01/18/19 0847     01/18/19 0800  vancomycin (VANCOCIN) IVPB 750 mg/150 ml premix  Status:  Discontinued     750 mg 150 mL/hr over 60 Minutes Intravenous Every 12 hours 01/17/19 2235 01/18/19 0847   01/17/19 2230  cefTRIAXone (ROCEPHIN) 2 g in sodium chloride 0.9 % 100 mL IVPB     2 g 200 mL/hr over 30 Minutes Intravenous Every 24 hours 01/17/19 2224     01/17/19 2230  metroNIDAZOLE  (FLAGYL) tablet 500 mg     500 mg Oral Every 8 hours 01/17/19 2224     01/17/19 2215  vancomycin (VANCOCIN) IVPB 1000 mg/200 mL premix     1,000 mg 200 mL/hr over 60 Minutes Intravenous  Once 01/17/19 2208 01/17/19 2340        Objective:   Vitals:   01/22/19 0546 01/22/19 1714 01/22/19 2007 01/23/19 0459  BP: 121/67 124/73 (!) 146/78 118/71  Pulse: 71 72 73 66  Resp: 16 16 17 16   Temp: 98 F (36.7 C) 98 F (36.7 C) 98.2 F (36.8 C) (!) 97.3 F (36.3 C)  TempSrc: Oral Oral Oral Oral  SpO2: 95% 97% 95% 98%  Weight:  73.3 kg    Height:  5\' 10"  (1.778 m)      Wt Readings from Last 3 Encounters:  01/22/19 73.3 kg  01/16/19 72.6 kg  12/30/18 74.8 kg    Intake/Output Summary (Last 24 hours) at 01/23/2019 0828 Last data filed at 01/23/2019 2130 Gross per 24 hour  Intake 767.3 ml  Output 550 ml  Net 217.3 ml   Physical Exam Gen:- Awake Alert,  In no apparent distress  HEENT:- [place].AT, No sclera  icterus Neck-Supple Neck,No JVD,.  Lungs-  CTAB , fair symmetrical air movement CV- S1, S2 normal, regular  Abd-  +ve B.Sounds, Abd Soft, No tenderness,    Extremity--pedal pulses present  Psych-affect is appropriate, oriented x3 Neuro-Chronic Lt sided Hemiparesis, but no new focal deficits, no tremors MSK--right great toe findings as noted in photos in epic    Data Review:   Micro Results Recent Results (from the past 240 hour(s))  Blood culture (routine x 2)     Status: None   Collection Time: 01/17/19  8:48 PM   Specimen: BLOOD RIGHT ARM  Result Value Ref Range Status   Specimen Description BLOOD RIGHT ARM  Final   Special Requests   Final    BOTTLES DRAWN AEROBIC AND ANAEROBIC Blood Culture adequate volume   Culture   Final    NO GROWTH 5 DAYS Performed at [facility], [address], [place], [place] [address]    Report Status 01/22/2019 FINAL  Final  Blood culture (routine x 2)     Status: None   Collection Time: 01/17/19  8:57 PM   Specimen: BLOOD RIGHT HAND   Result Value Ref Range Status   Specimen Description BLOOD RIGHT HAND  Final   Special Requests   Final    BOTTLES DRAWN AEROBIC AND ANAEROBIC Blood Culture adequate volume   Culture   Final    NO GROWTH 5 DAYS Performed at [facility], [address] [place], [place] [address]    Report Status 01/22/2019 FINAL  Final  SARS CORONAVIRUS 2 Nasal Swab Aptima Multi Swab     Status: None   Collection Time: 01/18/19 12:39 AM   Specimen: Aptima Multi Swab; Nasal Swab  Result Value Ref Range Status   SARS Coronavirus 2 NEGATIVE NEGATIVE Final    Comment: (NOTE) SARS-CoV-2 target nucleic acids are NOT DETECTED. The SARS-CoV-2 RNA is generally detectable in upper and lower respiratory specimens during the acute phase of infection. Negative results do not preclude SARS-CoV-2 infection, do not rule out co-infections with other pathogens, and should not be used as the sole basis for treatment or other patient management decisions. Negative results must be combined with clinical observations, patient history, and epidemiological information. The expected result is Negative. Fact Sheet for Patients: HairSlick.no Fact Sheet for Healthcare Providers: quierodirigir.com This test is not yet approved or cleared by the [place] FDA and  has been authorized for detection and/or diagnosis of SARS-CoV-2 by FDA under an Emergency Use Authorization (EUA). This EUA will remain  in effect (meaning this test can be used) for the duration of the COVID-19 declaration under Section 56 4(b)(1) of the Act, 21 U.S.C. section 360bbb-3(b)(1), unless the authorization is terminated or revoked sooner. Performed at [facility], [address], [place], [place] [address]   MRSA PCR Screening     Status: None   Collection Time: 01/19/19  6:38 AM   Specimen: Nasal Mucosa; Nasopharyngeal  Result Value Ref Range Status   MRSA by PCR NEGATIVE NEGATIVE  Final    Comment:        The GeneXpert MRSA Assay (FDA approved for NASAL specimens only), is one component of a comprehensive MRSA colonization surveillance program. It is not intended to diagnose MRSA infection nor to guide or monitor treatment for MRSA infections. Performed at [facility], [address] [place], [place] [address]   Gastrointestinal Panel by PCR , Stool     Status: None   Collection Time: 01/20/19  5:37 PM   Specimen: Stool  Result Value Ref Range Status   Campylobacter species NOT DETECTED NOT DETECTED Final   Plesimonas shigelloides NOT DETECTED NOT DETECTED Final   Salmonella species NOT DETECTED NOT DETECTED Final   Yersinia enterocolitica NOT DETECTED NOT DETECTED Final   Vibrio species NOT DETECTED NOT DETECTED Final   Vibrio cholerae NOT DETECTED NOT DETECTED Final   Enteroaggregative E coli (EAEC) NOT DETECTED NOT DETECTED Final   Enteropathogenic E coli (EPEC) NOT DETECTED NOT DETECTED Final   Enterotoxigenic E coli (ETEC) NOT DETECTED NOT DETECTED Final   Shiga like toxin producing E coli (STEC) NOT DETECTED NOT DETECTED Final   Shigella/Enteroinvasive E coli (EIEC) NOT DETECTED NOT DETECTED Final   Cryptosporidium NOT DETECTED NOT DETECTED Final   Cyclospora cayetanensis NOT DETECTED NOT DETECTED Final   Entamoeba histolytica NOT DETECTED NOT DETECTED Final   Giardia lamblia NOT DETECTED NOT DETECTED Final   Adenovirus F40/41 NOT DETECTED NOT DETECTED Final   Astrovirus NOT DETECTED NOT DETECTED Final   Norovirus GI/GII NOT DETECTED NOT DETECTED Final   Rotavirus A NOT DETECTED NOT DETECTED Final   Sapovirus (I, II, IV, and V) NOT DETECTED NOT DETECTED Final    Comment: Performed at [facility], [address], [place], [place] [address]    Radiology Reports Mr Foot Right Wo Contrast  Result Date: 01/18/2019 CLINICAL DATA:  Diabetic patient with an open wound on the right great toe. EXAM: MRI OF THE RIGHT FOREFOOT WITHOUT CONTRAST  TECHNIQUE: Multiplanar, multisequence MR imaging of the right forefoot was performed. No intravenous contrast was administered. COMPARISON:  Plain films right foot 01/17/2019. FINDINGS: Bones/Joint/Cartilage Motion degrades the examination. There is marrow edema throughout the distal phalanx of the great toe consistent with osteomyelitis. No other evidence of osteomyelitis is identified. Ligaments Intact. Muscles and Tendons Intact. No intramuscular fluid collection. 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 Soft tissues Skin wound at the distal aspect of the great toe is identified. No abscess is seen. IMPRESSION: Motion degraded examination demonstrates marrow edema throughout the distal phalanx of the great toe consistent with osteomyelitis. No abscess is identified. No evidence of septic joint. Electronically Signed   By: Drusilla Kanner M.D.   On: 01/18/2019 07:35   US Arterial Abi (screening Lower Extremity)  Result Date: 01/18/2019 CLINICAL DATA:  Right diabetic open foot wound, great toe. Previous amputation. EXAM: NONINVASIVE PHYSIOLOGIC VASCULAR STUDY OF BILATERAL LOWER EXTREMITIES TECHNIQUE: Evaluation of both lower extremities were performed at rest, including calculation of ankle-brachial indices with single level Doppler, pressure and pulse volume recording. COMPARISON:  None. FINDINGS: Right ABI:  1.28 Left ABI:  1.16 Right Lower Extremity:  Multiphasic waveform at the ankle. Left Lower Extremity:  Multiphasic waveform at the ankle. IMPRESSION: Normal bilateral ABIs. Vascular calcification and resultant limited compressibility in the setting of diabetes can result in elevated values however. Electronically Signed   By: Corlis Leak M.D.   On: 01/18/2019 09:50   Dg Foot Complete Right  Result Date: 01/17/2019 CLINICAL DATA:  Swelling and drainage from right great toe. History of diabetes. EXAM: RIGHT FOOT COMPLETE - 3+ VIEW  COMPARISON:  Radiographs 12/30/2018 FINDINGS: No acute fracture or traumatic malalignment. There is soft tissue swelling and ulceration at the tip of the first digit. No subcutaneous gas few punctate radiodensities are present within the soft tissues which are new from prior exam and could reflect debris or foreign body.  No bony erosion, destructive change or immature periostitis to suggest early radiographic features of osteomyelitis. 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 Hammertoe deformities of the third and fourth digits. May mild mid foot arthrosis is present as well. Please note alignment of the mid and hindfoot is incompletely assessed on nonweightbearing radiographs. Calcaneal spurs are present at the Achilles and plantar ligament insertions. IMPRESSION: 1. Soft tissue swelling and ulceration at the tip of the first digit. Few punctate radiodensities within the soft tissues are new from prior exam and could reflect debris or foreign body and should be correlated with visual inspection. 2. No radiographic evidence of osteomyelitis. MRI is more sensitive and specific for early findings of osteomyelitis. Electronically Signed   By: Kreg Shropshire M.D.   On: 01/17/2019 21:41   Dg Foot Complete Right  Result Date: 12/30/2018 CLINICAL DATA:  Infection, right great toe EXAM: RIGHT FOOT COMPLETE - 3+ VIEW COMPARISON:  None. FINDINGS: No fracture or dislocation of the right foot. No bony erosion or sclerosis of the great toe to suggest osteomyelitis. There is moderate first metatarsophalangeal arthrosis. Mild midfoot arthrosis. The remaining joint spaces are well preserved. Mild soft tissue edema of the great toe. IMPRESSION: No fracture or dislocation of the right foot. No bony erosion or sclerosis of the great toe to suggest osteomyelitis. There is moderate first metatarsophalangeal arthrosis. Mild midfoot arthrosis. The remaining joint spaces are well preserved. Mild soft tissue  edema of the great toe. MRI may be used to more sensitively evaluate for bone marrow edema and osteomyelitis if suspected. Electronically Signed   By: Lauralyn Primes M.D.   On: 12/30/2018 13:10    CBC Recent Labs  Lab 01/17/19 2057 01/18/19 0408 01/19/19 0507 01/20/19 0604 01/21/19 0611 01/23/19 0456  WBC 11.8* 10.9* 13.2* 10.0 9.3 10.0  HGB 11.7* 10.7* 11.4* 11.0* 11.8* 12.0*  HCT 36.0* 33.1* 36.2* 35.0* 37.2* 36.3*  PLT 364 308 344 345 350 349  MCV 89.8 91.2 92.3 90.7 91.6 89.4  MCH 29.2 29.5 29.1 28.5 29.1 29.6  MCHC 32.5 32.3 31.5 31.4 31.7 33.1  RDW 13.0 12.9 12.8 13.0 12.9 12.7  LYMPHSABS 2.2 2.0 1.3 1.8 1.8  --   MONOABS 1.4* 1.4* 1.6* 1.3* 1.3*  --   EOSABS 0.4 0.4 0.5 0.5 0.6*  --   BASOSABS 0.1 0.1 0.1 0.1 0.1  --     Chemistries  Recent Labs  Lab 01/17/19 2057 01/19/19 0507 01/20/19 0604 01/22/19 0520 01/23/19 0456  NA 133*  --  138 138 138  K 4.4  --  3.8 4.2 4.3  CL 102  --  105 106 102  CO2 22  --  26 24 25   GLUCOSE 181*  --  162* 129* 111*  BUN 37*  --  31* 32* 37*  CREATININE 1.21 1.11 1.15 1.07 1.32*  CALCIUM 8.9  --  8.9 8.9 9.0     Kristine Garbe Edan Serratore DO on 01/23/2019 at 8:28 AM  Go to [email or web address] - for contact info  [facility] - Office  [phone]

## 2019-01-23 NOTE — Care Management (Addendum)
Notified April at [facility] , patient transferred to [place]. Patient is a member at [employer] . PCP is DR Marjo Bicker, social worker is Golda Acre pager [phone], office phone [phone] ext [phone]   Paged social worker, awaiting call back.   Magdalen Spatz RN [phone]

## 2019-01-23 NOTE — TOC Initial Note (Addendum)
Transition of Care [facility]) - Initial/Assessment Note    Patient Details  Name: Wesley Ingram MRN: [id number] Date of Birth: 14-Nov-1945  Transition of Care [facility]) CM/SW Contact:    Kingsley Plan, RN Phone Number: 01/23/2019, 11:23 AM  Clinical Narrative:        1506 Received phone call from [facility] social worker Guy Begin phone [phone] ext [phone] , fax [phone] .            Patient from home with wife and has two daughters.   Patient has a hemi walker, shower chair and wheelchair at home. [facility] provided a rail on steps to enter home.   Have called and paged patient's social worker M. Laughin 336 [phone] office [phone] ext [phone] . Awaiting call back . Paged and left voice mail.   Patient does not have home health at present.    Patient requesting PT eval. Ruth with PT is assigned to see patient today.   Patient prefers to go home with home health vs SNF. Will continue to follow.    Barriers to Discharge: Continued Medical Work up   Patient Goals and CMS Choice Patient states their goals for this hospitalization and ongoing recovery are:: to go home CMS Medicare.gov Compare Post Acute Care list provided to:: Patient    Expected Discharge Plan and Services     Discharge Planning Services: CM Consult   Living arrangements for the past 2 months: Single Family Home                                      Prior Living Arrangements/Services Living arrangements for the past 2 months: Single Family Home Lives with:: Spouse Patient language and need for interpreter reviewed:: Yes Do you feel safe going back to the place where you live?: Yes      Need for Family Participation in Patient Care: Yes (Comment) Care giver support system in place?: Yes (comment) Current home services: DME Criminal Activity/Legal Involvement Pertinent to Current Situation/Hospitalization: No - Comment as needed  Activities of Daily Living Home Assistive Devices/Equipment: CBG  Meter, Eyeglasses, Shower chair with back, Walker (specify type) ADL Screening (condition at time of admission) Patient's cognitive ability adequate to safely complete daily activities?: Yes Is the patient deaf or have difficulty hearing?: No Does the patient have difficulty seeing, even when wearing glasses/contacts?: No Does the patient have difficulty concentrating, remembering, or making decisions?: No Patient able to express need for assistance with ADLs?: Yes Does the patient have difficulty dressing or bathing?: Yes Independently performs ADLs?: Yes (appropriate for developmental age) Does the patient have difficulty walking or climbing stairs?: Yes Weakness of Legs: Right Weakness of Arms/Hands: None  Permission Sought/Granted   Permission granted to share information with : Yes, Verbal Permission Granted     Permission granted to share info w AGENCY: Dr Marko Stai , wife        Emotional Assessment Appearance:: Appears stated age Attitude/Demeanor/Rapport: Engaged Affect (typically observed): Accepting Orientation: : Oriented to Self, Oriented to Place, Oriented to  Time, Oriented to Situation Alcohol / Substance Use: Not Applicable Psych Involvement: No (comment)  Admission diagnosis:  Diabetic foot ulcer (HCC) [G29.528, L97.509] Cellulitis of right lower extremity [L03.115] Patient Active Problem List   Diagnosis Date Noted  . Diabetic foot ulcer (HCC) 01/17/2019  . Anemia 01/17/2019  . Diarrhea 01/16/2019  . Ileus (  HCC) 10/19/2016  . History of CVA (cerebrovascular accident) 10/19/2016  . Nephrolithiasis 10/19/2016  . Lytic bone lesions on xray 10/19/2016  . Acute renal failure (ARF) (HCC) 10/19/2016  . Chronic anticoagulation 10/19/2016  . TIA (transient ischemic attack) 02/25/2011  . Wound cellulitis 02/25/2011  . CVA (cerebral vascular accident) (HCC) 02/23/2011  . Type 2 diabetes mellitus (HCC) 02/23/2011  . HTN (hypertension) 02/23/2011  . Hyperlipidemia  02/23/2011   PCP:  [facility]:   [facility] [address] [place] [phone] [place] [address] Phone: [phone] Fax: [phone]     Social Determinants of Health (SDOH) Interventions    Readmission Risk Interventions No flowsheet data found.

## 2019-01-23 NOTE — H&P (View-Only) (Signed)
[facility] [place] CONSULT NOTE   MRN : [id number]  Reason for Consult: right GT ulcer with osteomyelitis infection. Referring Physician:  Courage Emokpae M.D  History of Present Illness: 72 y/o male admitted with recent Hx  of right GT non healing ulcer.  The wound has been present for 2 weeks now.  His MRI of right foot consistent with osteomyelitis.  He denise rest pain and Claudication.  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  Past medical history includes left hemiparesis s/p CVA, DM, hypertriglyceridemia, and hypertension.  He is on Eliquis which is being held for pre-procedure.       Current Facility-Administered Medications  Medication Dose Route Frequency Provider Last Rate Last Dose  . 0.9 %  sodium chloride infusion   Intravenous PRN Bridges, Lindsay C, MD   Stopped at 01/23/19 0617  . acetaminophen (TYLENOL) tablet 650 mg  650 mg Oral Q6H PRN Ortiz, David Manuel, MD       Or  . acetaminophen (TYLENOL) suppository 650 mg  650 mg Rectal Q6H PRN Ortiz, David Manuel, MD      . amLODipine (NORVASC) tablet 2.5 mg  2.5 mg Oral Daily Ortiz, David Manuel, MD   2.5 mg at 01/22/19 1003  . [START ON 01/25/2019] apixaban (ELIQUIS) tablet 5 mg  5 mg Oral BID Emokpae, Courage, MD      . atorvastatin (LIPITOR) tablet 20 mg  20 mg Oral q morning - 10a Ortiz, David Manuel, MD   20 mg at 01/22/19 1003  . cefTRIAXone (ROCEPHIN) 2 g in sodium chloride 0.9 % 100 mL IVPB  2 g Intravenous Q24H Ortiz, David Manuel, MD   Stopped at 01/22/19 2327  . feeding supplement (PRO-STAT SUGAR FREE 64) liquid 30 mL  30 mL Oral BID Emokpae, Courage, MD   30 mL at 01/22/19 2102  . glipiZIDE (GLUCOTROL) tablet 2.5 mg  2.5 mg Oral BID AC Emokpae, Courage, MD   2.5 mg at 01/23/19 0818  . insulin aspart (novoLOG) injection 0-15 Units  0-15 Units Subcutaneous TID WC Ortiz, David Manuel, MD   3 Units at 01/22/19 1314  . loperamide (IMODIUM) capsule 2 mg  2  mg Oral Q12H PRN Emokpae, Courage, MD   2 mg at 01/22/19 1003  . metoprolol succinate (TOPROL-XL) 24 hr tablet 12.5 mg  12.5 mg Oral Daily Ortiz, David Manuel, MD   12.5 mg at 01/22/19 1003  . metroNIDAZOLE (FLAGYL) tablet 500 mg  500 mg Oral Q8H Ortiz, David Manuel, MD   500 mg at 01/23/19 0558  . nutrition supplement (JUVEN) (JUVEN) powder packet 1 packet  1 packet Oral BID BM Emokpae, Courage, MD   1 packet at 01/22/19 1355  . vancomycin (VANCOCIN) IVPB 750 mg/150 ml premix  750 mg Intravenous Q12H Emokpae, Courage, MD   Stopped at 01/22/19 2243    Pt meds include: Statin :Yes Betablocker: Yes ASA: No Other anticoagulants/antiplatelets: Eliquis  Past Medical History:  Diagnosis Date  . Diabetes mellitus   . DVT (deep venous thrombosis) (HCC)   . Foot ulcer due to secondary DM (HCC)   . High triglycerides   . Hypertension   . PVD (peripheral vascular disease) (HCC)   . Renal disorder   . Stroke (HCC) 2008   left hemiparesis    Past Surgical History:  Procedure Laterality Date  . COLONOSCOPY  2009   [facility] in [place]: diverticulosis, next TCS in   07/2017  . CYSTOSCOPY WITH RETROGRADE PYELOGRAM, URETEROSCOPY AND STENT PLACEMENT Right 11/19/2016   Procedure: CYSTOSCOPY WITH RETROGRADE PYELOGRAM, URETEROSCOPY AND STENT EXCHANGE;  Surgeon: Manny, Theodore, MD;  Location: [facility];  Service: Urology;  Laterality: Right;  . CYSTOSCOPY WITH STENT PLACEMENT Right 10/19/2016   Procedure: CYSTOSCOPY WITH STENT PLACEMENT;  Surgeon: Manny, Theodore, MD;  Location: [facility];  Service: Urology;  Laterality: Right;  . ENDARTERECTOMY     Left popliteal artery endarterectomy with left above-knee popliteal artery to tibial peroneal trunk bypass with reverse saphenous vein  . HERNIA REPAIR     bilateral inguinal  . HOLMIUM LASER APPLICATION Right 11/19/2016   Procedure: HOLMIUM LASER APPLICATION;  Surgeon: Manny, Theodore, MD;  Location: [facility];  Service: Urology;  Laterality: Right;  . TOE  AMPUTATION    . TONSILLECTOMY      Social History Social History   Tobacco Use  . Smoking status: Never Smoker  . Smokeless tobacco: Never Used  Substance Use Topics  . Alcohol use: No  . Drug use: No    Family History Family History  Problem Relation Age of Onset  . Multiple myeloma Mother 85  . Colon cancer Neg Hx     No Known Allergies   REVIEW OF SYSTEMS  General: [ ] Weight loss, [ ] Fever, [ ] chills Neurologic: [ ] Dizziness, [ ] Blackouts, [ ] Seizure [ ] Stroke, [ ] "Mini stroke", [ ] Slurred speech, [ ] Temporary blindness; [ ] weakness in arms or legs, [ ] Hoarseness [ ] Dysphagia Cardiac: [ ] Chest pain/pressure, [ ] Shortness of breath at rest [ ] Shortness of breath with exertion, [ ] Atrial fibrillation or irregular heartbeat  Vascular: [ ] Pain in legs with walking, [ ] Pain in legs at rest, [ ] Pain in legs at night,  [ ] Non-healing ulcer, [ ] Blood clot in vein/DVT,   Pulmonary: [ ] Home oxygen, [ ] Productive cough, [ ] Coughing up blood, [ ] Asthma,  [ ] Wheezing [ ] COPD Musculoskeletal:  [ ] Arthritis, [ ] Low back pain, [ ] Joint pain Hematologic: [ ] Easy Bruising, [ ] Anemia; [ ] Hepatitis Gastrointestinal: [ ] Blood in stool, [ ] Gastroesophageal Reflux/heartburn, Urinary: [ ] chronic Kidney disease, [ ] on HD - [ ] MWF or [ ] TTHS, [ ] Burning with urination, [ ] Difficulty urinating Skin: [ ] Rashes, [ ] Wounds Psychological: [ ] Anxiety, [ ] Depression  Physical Examination Vitals:   01/22/19 0546 01/22/19 1714 01/22/19 2007 01/23/19 0459  BP: 121/67 124/73 (!) 146/78 118/71  Pulse: 71 72 73 66  Resp: 16 16 17 16  Temp: 98 F (36.7 C) 98 F (36.7 C) 98.2 F (36.8 C) (!) 97.3 F (36.3 C)  TempSrc: Oral Oral Oral Oral  SpO2: 95% 97% 95% 98%  Weight:  73.3 kg    Height:  5' 10" (1.778 m)     Body mass index is 23.19 kg/m.  General:  WDWN in NAD Gait: Normal HENT: WNL Eyes: Pupils equal Pulmonary: normal non-labored breathing  , without Rales, rhonchi,  wheezing Cardiac: RRR, without  Murmurs, rubs or gallops; No carotid bruits Abdomen: soft, NT, no masses Skin: no rashes, GT medial tip black eschar ulcer noted  Vascular Exam/Pulses:Palpable radial, femoral and DP/PT pulses   Musculoskeletal: no muscle wasting or atrophy; no edema  Neurologic: A&O X 3; Appropriate Affect ;  SENSATION: normal; MOTOR FUNCTION: 5/5   Symmetric right UE/LE, weakness/paraliasis left UE/LE s/p CVA Speech is fluent/normal   Significant Diagnostic Studies: CBC Lab Results  Component Value Date   WBC 10.0 01/23/2019   HGB 12.0 (L) 01/23/2019   HCT 36.3 (L) 01/23/2019   MCV 89.4 01/23/2019   PLT 349 01/23/2019    BMET    Component Value Date/Time   NA 138 01/23/2019 0456   K 4.3 01/23/2019 0456   CL 102 01/23/2019 0456   CO2 25 01/23/2019 0456   GLUCOSE 111 (H) 01/23/2019 0456   BUN 37 (H) 01/23/2019 0456   CREATININE 1.32 (H) 01/23/2019 0456   CALCIUM 9.0 01/23/2019 0456   GFRNONAA 54 (L) 01/23/2019 0456   GFRAA >60 01/23/2019 0456   Estimated Creatinine Clearance: 53 mL/min (A) (by C-G formula based on SCr of 1.32 mg/dL (H)).  COAG Lab Results  Component Value Date   INR 1.39 10/19/2016     Non-Invasive Vascular Imaging:  MRI IMPRESSION: Motion degraded examination demonstrates marrow edema throughout the distal phalanx of the great toe consistent with osteomyelitis. No abscess is identified. No evidence of septic joint.  ASSESSMENT/PLAN:  Non healing right GT ulcer Plan will be for angiogram with runoff to delineate his ability to heal a wound and or heal a GT amputation.  This is planned for Thursday with Dr. Cain.     Wesley Ingram 01/23/2019 9:23 AM   I agree with the above.  I have seen and evaluated the patient.  Briefly this is a 72-year-old gentleman with diabetes and a right great toe ulcer.  He was transferred down for possible angiography however on physical examination, he has a  palpable pedal pulse which I can feel out to the origin of his great toe.  Therefore I do not feel that angiography is necessary and we can proceed with a great toe amputation.  This was discussed with the patient and he is in agreement.  I will plan for right great toe amputation on Thursday.  If there are any issues with perfusion, I will consider angiography after the amputation.  Wesley Ingram  

## 2019-01-23 NOTE — Progress Notes (Signed)
PT Cancellation Note  Patient Details Name: Wesley Ingram MRN: [id number] DOB: July 14, 1945   Cancelled Treatment:    Reason Eval/Treat Not Completed: Other (comment).  Has his meal and will reattempt later.   Ramond Dial 01/23/2019, 1:00 PM   Mee Hives, PT MS Acute Rehab Dept. Number: [place] and [place]

## 2019-01-23 NOTE — Evaluation (Signed)
Physical Therapy Evaluation Patient Details Name: Wesley Ingram MRN: [id number] DOB: 1945-06-27 Today's Date: 01/23/2019   History of Present Illness  73 yo male with residual L hemiparesis was admitted for ulcer on R great toe with arteriogram on RLE scheduled 01/25/19.  Pt is home with wife, note his wound was referred to ED by home health nurse.  Pt has PMHx:  HTN, CVA, DM, HLD, renal disorder, anemia  Clinical Impression  Pt was seen for mobility and strength testing to assess his ability to go home with wife.  He is unable to provide full details of home, and is not clear if this is his baseline or a change cognitively.  Will continue on with SNF requested based on the dense L side symptoms, the lack of balance sitting and the progressively greater difficulty with harnessing the effort to stand.  See pt to follow through with care plan, and will try to obtain further information as possible with family on prior level.     Follow Up Recommendations SNF    Equipment Recommendations  None recommended by PT    Recommendations for Other Services       Precautions / Restrictions Precautions Precautions: Fall Precaution Comments: L hemiparesis Restrictions Weight Bearing Restrictions: No Other Position/Activity Restrictions: functionally cannot WB on LLE       Mobility  Bed Mobility Overal bed mobility: Needs Assistance Bed Mobility: Supine to Sit;Sit to Supine     Supine to sit: Mod assist Sit to supine: Max assist   General bed mobility comments: LLE did not assist to return to Bed  Transfers Overall transfer level: Needs assistance Equipment used: 1 person hand held assist;Hemi-walker Transfers: Sit to/from Stand Sit to Stand: Total assist         General transfer comment: pt cannot clear the bed with PT and hemiwalker  Ambulation/Gait             General Gait Details: unable  Stairs            Wheelchair Mobility    Modified Rankin (Stroke  Patients Only) Modified Rankin (Stroke Patients Only) Modified Rankin: Severe disability     Balance Overall balance assessment: Needs assistance Sitting-balance support: Feet supported;Single extremity supported Sitting balance-Leahy Scale: Poor       Standing balance-Leahy Scale: Zero                               Pertinent Vitals/Pain Pain Assessment: No/denies pain    Home Living Family/patient expects to be discharged to:: Private residence Living Arrangements: Spouse/significant other;Children Available Help at Discharge: Family;Available 24 hours/day Type of Home: House Home Access: Ramped entrance     Home Layout: One level Home Equipment: Other (comment)(hemiwalker) Additional Comments: pt thinks he was up walking all around outside but cannot stand bedside    Prior Function Level of Independence: Needs assistance   Gait / Transfers Assistance Needed: hemiwalker without help per pt, may not be accurate  ADL's / Homemaking Assistance Needed: lives with wife who per pt is moving well        Hand Dominance   Dominant Hand: Right    Extremity/Trunk Assessment   Upper Extremity Assessment Upper Extremity Assessment: LUE deficits/detail LUE Deficits / Details: LUE is densely weak but can support some weight through elbow LUE Coordination: decreased fine motor;decreased gross motor    Lower Extremity Assessment Lower Extremity Assessment: LLE deficits/detail LLE Deficits / Details:  ankle cannot DF and cannot move LLE without help    Cervical / Trunk Assessment Cervical / Trunk Assessment: Normal  Communication   Communication: No difficulties  Cognition Arousal/Alertness: Lethargic Behavior During Therapy: Flat affect;Impulsive Overall Cognitive Status: No family/caregiver present to determine baseline cognitive functioning                                 General Comments: pt is not fully clear on PLOF and has episodes of  lack of awareness of safety      General Comments General comments (skin integrity, edema, etc.): pt was unable to sit with safety as he is very neglectful of L side and looks and leans to the right with an unsafe technique    Exercises     Assessment/Plan    PT Assessment Patient needs continued PT services  PT Problem List Decreased strength;Decreased range of motion;Decreased activity tolerance;Decreased balance;Decreased mobility;Decreased coordination;Decreased cognition;Decreased knowledge of use of DME;Decreased safety awareness;Cardiopulmonary status limiting activity       PT Treatment Interventions DME instruction;Gait training;Functional mobility training;Therapeutic activities;Therapeutic exercise;Balance training;Neuromuscular re-education;Patient/family education    PT Goals (Current goals can be found in the Care Plan section)  Acute Rehab PT Goals Patient Stated Goal: to sit alone on side of bed PT Goal Formulation: With patient Time For Goal Achievement: 02/06/19 Potential to Achieve Goals: Fair    Frequency Min 3X/week   Barriers to discharge Decreased caregiver support needs two person assist to stand    Co-evaluation               AM-PAC PT "6 Clicks" Mobility  Outcome Measure Help needed turning from your back to your side while in a flat bed without using bedrails?: A Little Help needed moving from lying on your back to sitting on the side of a flat bed without using bedrails?: A Lot Help needed moving to and from a bed to a chair (including a wheelchair)?: Total Help needed standing up from a chair using your arms (e.g., wheelchair or bedside chair)?: Total Help needed to walk in hospital room?: Total Help needed climbing 3-5 steps with a railing? : Total 6 Click Score: 9    End of Session Equipment Utilized During Treatment: Gait belt Activity Tolerance: Patient limited by fatigue;Treatment limited secondary to medical complications  (Comment) Patient left: in bed;with call bell/phone within reach;with bed alarm set Nurse Communication: Mobility status PT Visit Diagnosis: Unsteadiness on feet (R26.81);Muscle weakness (generalized) (M62.81);Hemiplegia and hemiparesis;Difficulty in walking, not elsewhere classified (R26.2) Hemiplegia - Right/Left: Left Hemiplegia - dominant/non-dominant: Non-dominant Hemiplegia - caused by: Unspecified    Time: 8295-6213 PT Time Calculation (min) (ACUTE ONLY): 24 min   Charges:   PT Evaluation $PT Eval Moderate Complexity: 1 Mod PT Treatments $Therapeutic Activity: 8-22 mins       Ivar Drape 01/23/2019, 4:18 PM   Samul Dada, PT MS Acute Rehab Dept. Number: [facility] [id number] and [facility] [phone]

## 2019-01-24 LAB — GLUCOSE, CAPILLARY
Glucose-Capillary: 154 mg/dL — ABNORMAL HIGH (ref 70–99)
Glucose-Capillary: 144 mg/dL — ABNORMAL HIGH (ref 70–99)
Glucose-Capillary: 119 mg/dL — ABNORMAL HIGH (ref 70–99)
Glucose-Capillary: 129 mg/dL — ABNORMAL HIGH (ref 70–99)

## 2019-01-24 LAB — CBC
HCT: 36.2 % — ABNORMAL LOW (ref 39.0–52.0)
Hemoglobin: 12.1 g/dL — ABNORMAL LOW (ref 13.0–17.0)
MCH: 29.6 pg (ref 26.0–34.0)
MCHC: 33.4 g/dL (ref 30.0–36.0)
MCV: 88.5 fL (ref 80.0–100.0)
Platelets: 348 10*3/uL (ref 150–400)
RBC: 4.09 MIL/uL — ABNORMAL LOW (ref 4.22–5.81)
RDW: 12.7 % (ref 11.5–15.5)
WBC: 9.3 10*3/uL (ref 4.0–10.5)
nRBC: 0 % (ref 0.0–0.2)

## 2019-01-24 LAB — BASIC METABOLIC PANEL
Anion gap: 10 (ref 5–15)
BUN: 37 mg/dL — ABNORMAL HIGH (ref 8–23)
CO2: 23 mmol/L (ref 22–32)
Calcium: 8.9 mg/dL (ref 8.9–10.3)
Chloride: 104 mmol/L (ref 98–111)
Creatinine, Ser: 1.27 mg/dL — ABNORMAL HIGH (ref 0.61–1.24)
GFR calc Af Amer: >60 mL/min (ref >60)
GFR calc non Af Amer: 56 mL/min — ABNORMAL LOW (ref >60)
Glucose, Bld: 139 mg/dL — ABNORMAL HIGH (ref 70–99)
Potassium: 3.9 mmol/L (ref 3.5–5.1)
Sodium: 137 mmol/L (ref 135–145)

## 2019-01-24 MED ORDER — ADULT MULTIVITAMIN W/MINERALS CH
1.0000 | ORAL_TABLET | Freq: Every day | ORAL | Status: DC
  Administered 2019-01-24 – 2019-01-29 (×6): 1 via ORAL
  Filled 2019-01-24 (×7): qty 1

## 2019-01-24 MED ORDER — MELATONIN 3 MG PO TABS
3.0000 mg | ORAL_TABLET | Freq: Every evening | ORAL | Status: DC | PRN
  Administered 2019-01-24 – 2019-01-28 (×6): 3 mg via ORAL
  Filled 2019-01-24 (×10): qty 1

## 2019-01-24 MED ORDER — POLYVINYL ALCOHOL 1.4 % OP SOLN
1.0000 [drp] | OPHTHALMIC | Status: DC | PRN
  Administered 2019-01-25 – 2019-01-26 (×3): 1 [drp] via OPHTHALMIC
  Filled 2019-01-24: qty 15

## 2019-01-24 NOTE — Progress Notes (Signed)
Nutrition Follow-up  RD working remotely.  DOCUMENTATION CODES:   Not applicable  INTERVENTION:   -Continue 30 ml Prostat BID, each supplement provides 100 kcals and 15 grams protein -Continue 1 packet Juven BID, each packet provides 95 calories, 2.5 grams of protein (collagen), and 9.8 grams of carbohydrate (3 grams sugar); also contains 7 grams of L-arginine and L-glutamine, 300 mg vitamin C, 15 mg vitamin E, 1.2 mcg vitamin B-12, 9.5 mg zinc, 200 mg calcium, and 1.5 g  Calcium Beta-hydroxy-Beta-methylbutyrate to support wound healing -MVI with minerals daily  NUTRITION DIAGNOSIS:   Increased nutrient needs related to wound healing as evidenced by estimated needs.  Ongoing  GOAL:   Patient will meet greater than or equal to 90% of their needs  Progressing   MONITOR:   PO intake, Supplement acceptance  REASON FOR ASSESSMENT:   Consult Wound healing  ASSESSMENT:   Patient is a 73 yo male with history of stroke, DM (A1C-pending), Renal disorder. Patient presents with foot pain- infection-soft tissue swelling right great toe.  8[facility]10- [facility]  Per vascular surgery notes, plan for angiogram.   Pt with good appetite; noted meal completion 50-100%. Pt is compliant with both Prostat and Juven supplements. Pt with one documented hypoglycemic episode, which was resolved.   Lab Results  Component Value Date   HGBA1C 6.3 (H) 01/18/2019   PTA DM medications are 1000 mg metformin BID and 5 mg glipizide BID.   Labs reviewed: CBGS: [id number] (inpatient orders for glycemic control are 2.5 mg glucotrol BID, 0-15 units insulin aspart TID with meals).   Diet Order:   Diet Order            Diet NPO time specified Except for: Sips with Meds  Diet effective midnight        Diet NPO time specified Except for: Sips with Meds  Diet effective midnight        Diet heart healthy/carb modified Room service appropriate? Yes; Fluid consistency: Thin  Diet effective now               EDUCATION NEEDS:   Education needs have been addressed  Skin:  Skin Assessment: Skin Integrity Issues: Skin Integrity Issues:: Diabetic Ulcer Diabetic Ulcer: rt great toe Other: -  Last BM:  01/24/19  Height:   Ht Readings from Last 1 Encounters:  01/22/19 5\' 10"  (1.778 m)    Weight:   Wt Readings from Last 1 Encounters:  01/22/19 73.3 kg    Ideal Body Weight:  75 kg  BMI:  Body mass index is 23.19 kg/m.  Estimated Nutritional Needs:   Kcal:  2100-2300  Protein:  105-120 grams  Fluid:  > 2.1 L    Kupono Marling A. Jimmye Norman, RD, LDN, [place] Registered Dietitian II Certified Diabetes Care and Education Specialist Pager: [phone] After hours Pager: [phone]

## 2019-01-24 NOTE — TOC Progression Note (Addendum)
Transition of Care [facility]) - Progression Note    Patient Details  Name: Wesley Ingram MRN: [id number] Date of Birth: [birth year]  [facility]) CM/SW Contact  Jaeson Molstad, Edson Snowball, RN Phone Number: 01/24/2019, 1:23 PM  Clinical Narrative:     [place] social worker Lanelle Bal phone [phone] ext [phone] , fax [phone], updated . Marland Kitchen            See previous note. Patient from home with wife. Has 2 daughters who assist.   PT recommendation SNF. Patient has been to [facility] in past and had " a bad experience". He prefers to go home , but realizes he is not at baseline.   Patient wife prefers him to come home at discharge but would like to speak to PT when they work with patient today. Private chatted and paged PT awaiting call back.Spoke to [place] with PT , she will call wife .   Patient is willing to consider SNF. Consented for NCM to get bed offer using his [place].  Updated Lanelle Bal at [facility] also.   Expected Discharge Plan: Skilled Nursing Facility Barriers to Discharge: Continued Medical Work up  Expected Discharge Plan and Services Expected Discharge Plan: [place] In-house Referral: Nutrition Discharge Planning Services: CM Consult   Living arrangements for the past 2 months: Single Family Home                 DME Arranged: N/A         HH Arranged: NA           Social Determinants of Health (SDOH) Interventions    Readmission Risk Interventions No flowsheet data found.

## 2019-01-24 NOTE — Progress Notes (Signed)
I spoke with the patient today regarding his procedure tomorrow.  I do not think angiography is necessary and we can proceed with right great toe amputation.  I suspect this will be able to be closed primarily.  He will need to be n.p.o. after midnight.  Annamarie Major

## 2019-01-24 NOTE — Progress Notes (Signed)
Physical Therapy Treatment Patient Details Name: Wesley Ingram MRN: [id number] DOB: [birth year] Today's Date: 01/24/2019    History of Present Illness Pt is a 73 y/o male with residual L hemiparesis was admitted for ulcer on R great toe with arteriogram on RLE scheduled 01/25/19.  Pt is home with wife, note his wound was referred to ED by home health nurse.  Pt has PMHx:  HTN, CVA, DM, HLD, renal disorder, anemia    PT Comments    Pt making very slow progress with mobility and continues to require heavy physical assistance for transfers, which sounds drastically different from his PLOF that he reported as only needing help to ascend/descend stairs. Continue to recommend SNF for further intensive therapy services prior to returning home with family. Pt would continue to benefit from skilled physical therapy services at this time while admitted and after d/c to address the below listed limitations in order to improve overall safety and independence with functional mobility.    Follow Up Recommendations  SNF     Equipment Recommendations  None recommended by PT    Recommendations for Other Services       Precautions / Restrictions Precautions Precautions: Fall Precaution Comments: L hemiparesis from prior stroke Restrictions Weight Bearing Restrictions: No    Mobility  Bed Mobility Overal bed mobility: Needs Assistance Bed Mobility: Supine to Sit     Supine to sit: Min assist     General bed mobility comments: increased time and effort, assistance needed for trunk elevation  Transfers Overall transfer level: Needs assistance Equipment used: Hemi-walker Transfers: Sit to/from [employer] Sit to Stand: Max assist Stand pivot transfers: Mod assist       General transfer comment: max A required to power into standing; pt with heavy R lateral lean initially; pt pivoting to recliner chair towards his R side with mod A   Ambulation/Gait              General Gait Details: unable   Stairs             Wheelchair Mobility    Modified Rankin (Stroke Patients Only)       Balance Overall balance assessment: Needs assistance Sitting-balance support: Feet supported Sitting balance-Leahy Scale: Fair     Standing balance support: Single extremity supported Standing balance-Leahy Scale: Poor Standing balance comment: mod-max A needed                            Cognition Arousal/Alertness: Awake/alert Behavior During Therapy: Flat affect;Impulsive Overall Cognitive Status: Within Functional Limits for tasks assessed                                        Exercises      General Comments        Pertinent Vitals/Pain Pain Assessment: No/denies pain    Home Living                      Prior Function            PT Goals (current goals can now be found in the care plan section) Acute Rehab PT Goals PT Goal Formulation: With patient Time For Goal Achievement: 02/06/19 Potential to Achieve Goals: Fair Progress towards PT goals: Progressing toward goals    Frequency    Min 3X/week  PT Plan Current plan remains appropriate    Co-evaluation              AM-PAC PT "6 Clicks" Mobility   Outcome Measure  Help needed turning from your back to your side while in a flat bed without using bedrails?: A Little Help needed moving from lying on your back to sitting on the side of a flat bed without using bedrails?: A Little Help needed moving to and from a bed to a chair (including a wheelchair)?: A Lot Help needed standing up from a chair using your arms (e.g., wheelchair or bedside chair)?: A Lot Help needed to walk in hospital room?: Total Help needed climbing 3-5 steps with a railing? : Total 6 Click Score: 12    End of Session Equipment Utilized During Treatment: Gait belt Activity Tolerance: Patient tolerated treatment well Patient left: in chair;with call  bell/phone within reach Nurse Communication: Mobility status PT Visit Diagnosis: Other abnormalities of gait and mobility (R26.89);Muscle weakness (generalized) (M62.81)     Time: 1610-9604 PT Time Calculation (min) (ACUTE ONLY): 24 min  Charges:  $Therapeutic Activity: 23-37 mins                     Deborah Chalk, , DPT  Acute Rehabilitation Services Pager [phone] Office [phone]     Alessandra Bevels Inara Dike 01/24/2019, 5:03 PM

## 2019-01-24 NOTE — Plan of Care (Signed)

## 2019-01-24 NOTE — [place] FL2 (Signed)
[place] MEDICAID FL2 LEVEL OF CARE SCREENING TOOL     IDENTIFICATION  Patient Name: Wesley Ingram Birthdate: July 08, 1945 Sex: male Admission Date (Current Location): 01/17/2019  [facility] and [place] Number:  Producer, television/film/video and Address:  [facility] [place] [facility], [address] [place], [place] [address]      Provider Number: [id number]  Attending Physician Name and Address:  Azucena Fallen, MD  Relative Name and Phone Number:  Eshawn Ohagan [phone]    Current Level of Care: Hospital Recommended Level of Care: Skilled Nursing Facility Prior Approval Number:    Date Approved/Denied:   PASRR Number: [id number]  Discharge Plan: SNF    Current Diagnoses: Patient Active Problem List   Diagnosis Date Noted  . Diabetic foot ulcer (HCC) 01/17/2019  . Anemia 01/17/2019  . Diarrhea 01/16/2019  . Ileus (HCC) 10/19/2016  . History of CVA (cerebrovascular accident) 10/19/2016  . Nephrolithiasis 10/19/2016  . Lytic bone lesions on xray 10/19/2016  . Acute renal failure (ARF) (HCC) 10/19/2016  . Chronic anticoagulation 10/19/2016  . TIA (transient ischemic attack) 02/25/2011  . Wound cellulitis 02/25/2011  . CVA (cerebral vascular accident) (HCC) 02/23/2011  . Type 2 diabetes mellitus (HCC) 02/23/2011  . HTN (hypertension) 02/23/2011  . Hyperlipidemia 02/23/2011    Orientation RESPIRATION BLADDER Height & Weight     Self, Time, Situation, Place  Normal Continent Weight: 73.3 kg Height:  5\' 10"  (177.8 cm)  BEHAVIORAL SYMPTOMS/MOOD NEUROLOGICAL BOWEL NUTRITION STATUS      Continent Diet  AMBULATORY STATUS COMMUNICATION OF NEEDS Skin   Extensive Assist Verbally Other (Comment)(right toe osteomyelitis , likely poorly healing diabetic foot ulcer, Cleanse right great toe with saline. Pat dry. Wrap Xeroform gauze [place] [phone]) around the right great toe.  Then wrap forefoot with kerlex.  Change daily.)                       Personal  Care Assistance Level of Assistance  Bathing, Dressing Bathing Assistance: Limited assistance   Dressing Assistance: Limited assistance     Functional Limitations Info  Sight, Hearing, Speech Sight Info: Adequate Hearing Info: Adequate Speech Info: Adequate    SPECIAL CARE FACTORS FREQUENCY  PT (By licensed PT), OT (By licensed OT)     PT Frequency: 5 times a week OT Frequency: 5 times a week            Contractures Contractures Info: Not present    Additional Factors Info  Code Status, Insulin Sliding Scale, Allergies Code Status Info: full code Allergies Info: no known allergies   Insulin Sliding Scale Info: sliding scale insulin three times a day with meals, Glucotrol 2.5 mg PO twice a day       Current Medications (01/24/2019):  This is the current hospital active medication list Current Facility-Administered Medications  Medication Dose Route Frequency Provider Last Rate Last Dose  . 0.9 %  sodium chloride infusion   Intravenous PRN Lucretia Roers, MD   Stopped at 01/23/19 0617  . acetaminophen (TYLENOL) tablet 650 mg  650 mg Oral Q6H PRN Bobette Mo, MD       Or  . acetaminophen (TYLENOL) suppository 650 mg  650 mg Rectal Q6H PRN Bobette Mo, MD      . amLODipine [facility]) tablet 2.5 mg  2.5 mg Oral Daily Bobette Mo, MD   2.5 mg at 01/24/19 0929  . atorvastatin (LIPITOR) tablet 20 mg  20  mg Oral q morning - 10a Bobette Mo, MD   20 mg at 01/24/19 4098  . feeding supplement (PRO-STAT SUGAR FREE 64) liquid 30 mL  30 mL Oral BID Mariea Clonts, Courage, MD   30 mL at 01/24/19 0929  . glipiZIDE (GLUCOTROL) tablet 2.5 mg  2.5 mg Oral BID AC Emokpae, Courage, MD   2.5 mg at 01/24/19 0820  . insulin aspart (novoLOG) injection 0-15 Units  0-15 Units Subcutaneous TID WC Bobette Mo, MD   2 Units at 01/24/19 1250  . loperamide (IMODIUM) capsule 2 mg  2 mg Oral Q12H PRN Shon Hale, MD   2 mg at 01/22/19 1003  . Melatonin TABS 3 mg   3 mg Oral QHS PRN Azucena Fallen, MD   3 mg at 01/24/19 0139  . metoprolol succinate (TOPROL-XL) 24 hr tablet 12.5 mg  12.5 mg Oral Daily Bobette Mo, MD   12.5 mg at 01/24/19 0929  . multivitamin with minerals tablet 1 tablet  1 tablet Oral Daily Azucena Fallen, MD   1 tablet at 01/24/19 1135  . nutrition supplement (JUVEN) (JUVEN) powder packet 1 packet  1 packet Oral BID BM Shon Hale, MD   1 packet at 01/24/19 1252     Discharge Medications: Please see discharge summary for a list of discharge medications.  Relevant Imaging Results:  Relevant Lab Results:   Additional Information SSI [id number]: IV antibiotics Zenaida Niece, Flagyl , Rocephin  Kingsley Plan, RN

## 2019-01-24 NOTE — Progress Notes (Signed)
°                                         ° ° Patient Demographics:  ° ° Wesley Ingram, is a 73 y.o. male, DOB - 10/16/1945, MRN:[id number] ° °Admit date - 01/17/2019   Admitting Physician David Manuel Ortiz, MD ° °Outpatient Primary MD for the patient is [facility] ° °LOS - 7 ° ° °Chief Complaint  °Patient presents with  °• Foot Pain  °    °Brief summary °Wesley Ingram is a 73 y.o. male with medical history significant of type 2 diabetes, hypertriglyceridemia, hypertension, renal disorder, history of CVA with residual left-sided hemiparesis who is coming to the emergency department for evaluation of his left big toe that after he was seen earlier by his home health nurse.  He does not know how he acquired this injury, but states that he has had it for at least 2 weeks.  He denies fever and night sweats, but feels chills and fatigue.  No headache, rhinorrhea, sore throat, wheezing, hemoptysis or dyspnea.  Denies chest pain, palpitations, dizziness, diaphoresis, PND, orthopnea or pitting edema of the lower extremities.  Denies abdominal pain, diarrhea, constipation, melena or hematochezia.  No dysuria, frequency or hematuria.  Denies blurred vision, polyuria, polydipsia, polyphagia. In ED initial vital signs were temperature 98.3 °F, pulse 67, respiration 18, blood pressure 125/74 mmHg and O2 sat 97% on room air.  He was given vancomycin in the emergency department. ° °Patient transfered from [facility] to [facility] on 01/22/2019 for further vascular surgery intervention. Discussed with Dr. Brabham--plans to do an arteriogram on 01/25/2019  - follow up with podiatry/ortho thereafter pending this evaluation. ° ° ° Subjective:  ° °No acute issues/events overnight, denies fever, chills, shortness of breath, headaches, nausea, vomiting, diarrhea, constipation.  Patient does indicate right foot pain but notes it is improving from previous on current regimen.  ° ° Assessment  & Plan  :  ° ° Principal Problem: °  Diabetic foot ulcer (HCC) °Active Problems: °  Type 2 diabetes mellitus (HCC) °  HTN (hypertension) °  Hyperlipidemia °  Anemia ° ° °Right great toe osteomyelitis-likely poorly healing diabetic foot ulcer, POA °-HOLD - IV Vanco, Flagyl and Rocephin (started 01/17/2019) °-MRI of right foot consistent with osteomyelitis  °-Vascular studies showed normal ABIs  °-continue local wound care. °-CRP WNL; ESR 29 (52 last week) °-Blood cultures NGTD °-Angiogram with runoff to delineate his ability to heal a wound and or heal a GT amputation. This is planned for Thursday with Dr. Cain.   °-Hold off on official surgical consultation until after arteriogram on 01/25/2019 °-Discussed with ID - will hold abx at this time to improve chances of obtaining viable culture in surgery. Following along. Appreciate insight/recs - likely vancomycin/ceftriaxone post-op until cultures result. ° °Type 2 Diabetes Mellitus, non-insulin-dependent °-A1c 6.3 reflecting diabetes is well controlled °-Previously decreased glipizide to 2.5 mg p.o. twice daily -even borderline hypoglycemia on well-controlled diet °-Continue to hold metformin-given possible need for contrast with concurrent vancomycin use, in attempt to avoid nephrotoxicity  °-Continue sliding scale insulin, hypoglycemic protocol, diabetic diet °   ° HTN (hypertension) °-Stable, °Continue Amlodipine 2.5 and metoprolol succinate 12.5 °Hold losartan as above to avoid nephrotoxicity °Monitor BP, HR, renal function electrolytes. °  °Hyperlipidemia °Continue atorvastatin 20 mg p.o. daily. ° °Chronic Anemia, likely anemia of chronic disease, stable °Follow H&H   °                                         ° ° Patient Demographics:  ° ° Wesley Ingram, is a 73 y.o. male, DOB - 01/07/1946, MRN:[id number] ° °Admit date - 01/17/2019   Admitting Physician David Manuel Ortiz, MD ° °Outpatient Primary MD for the patient is [facility] ° °LOS - 7 ° ° °Chief Complaint  °Patient presents with  °• Foot Pain  °    °Brief summary °Wesley Ingram is a 73 y.o. male with medical history significant of type 2 diabetes, hypertriglyceridemia, hypertension, renal disorder, history of CVA with residual left-sided hemiparesis who is coming to the emergency department for evaluation of his left big toe that after he was seen earlier by his home health nurse.  He does not know how he acquired this injury, but states that he has had it for at least 2 weeks.  He denies fever and night sweats, but feels chills and fatigue.  No headache, rhinorrhea, sore throat, wheezing, hemoptysis or dyspnea.  Denies chest pain, palpitations, dizziness, diaphoresis, PND, orthopnea or pitting edema of the lower extremities.  Denies abdominal pain, diarrhea, constipation, melena or hematochezia.  No dysuria, frequency or hematuria.  Denies blurred vision, polyuria, polydipsia, polyphagia. In ED initial vital signs were temperature 98.3 °F, pulse 67, respiration 18, blood pressure 125/74 mmHg and O2 sat 97% on room air.  He was given vancomycin in the emergency department. ° °Patient transfered from [facility] to [facility] on 01/22/2019 for further vascular surgery intervention. Discussed with Dr. Brabham--plans to do an arteriogram on 01/25/2019  - follow up with podiatry/ortho thereafter pending this evaluation. ° ° ° Subjective:  ° °No acute issues/events overnight, denies fever, chills, shortness of breath, headaches, nausea, vomiting, diarrhea, constipation.  Patient does indicate right foot pain but notes it is improving from previous on current regimen.  ° ° Assessment  & Plan  :  ° ° Principal Problem: °  Diabetic foot ulcer (HCC) °Active Problems: °  Type 2 diabetes mellitus (HCC) °  HTN (hypertension) °  Hyperlipidemia °  Anemia ° ° °Right great toe osteomyelitis-likely poorly healing diabetic foot ulcer, POA °-HOLD - IV Vanco, Flagyl and Rocephin (started 01/17/2019) °-MRI of right foot consistent with osteomyelitis  °-Vascular studies showed normal ABIs  °-continue local wound care. °-CRP WNL; ESR 29 (52 last week) °-Blood cultures NGTD °-Angiogram with runoff to delineate his ability to heal a wound and or heal a GT amputation. This is planned for Thursday with Dr. Cain.   °-Hold off on official surgical consultation until after arteriogram on 01/25/2019 °-Discussed with ID - will hold abx at this time to improve chances of obtaining viable culture in surgery. Following along. Appreciate insight/recs - likely vancomycin/ceftriaxone post-op until cultures result. ° °Type 2 Diabetes Mellitus, non-insulin-dependent °-A1c 6.3 reflecting diabetes is well controlled °-Previously decreased glipizide to 2.5 mg p.o. twice daily -even borderline hypoglycemia on well-controlled diet °-Continue to hold metformin-given possible need for contrast with concurrent vancomycin use, in attempt to avoid nephrotoxicity  °-Continue sliding scale insulin, hypoglycemic protocol, diabetic diet °   ° HTN (hypertension) °-Stable, °Continue Amlodipine 2.5 and metoprolol succinate 12.5 °Hold losartan as above to avoid nephrotoxicity °Monitor BP, HR, renal function electrolytes. °  °Hyperlipidemia °Continue atorvastatin 20 mg p.o. daily. ° °Chronic Anemia, likely anemia of chronic disease, stable °Follow H&H   °                                         ° ° Patient Demographics:  ° ° Wesley Ingram, is a 73 y.o. male, DOB - 10/16/1945, MRN:[id number] ° °Admit date - 01/17/2019   Admitting Physician David Manuel Ortiz, MD ° °Outpatient Primary MD for the patient is [facility] ° °LOS - 7 ° ° °Chief Complaint  °Patient presents with  °• Foot Pain  °    °Brief summary °Wesley Ingram is a 73 y.o. male with medical history significant of type 2 diabetes, hypertriglyceridemia, hypertension, renal disorder, history of CVA with residual left-sided hemiparesis who is coming to the emergency department for evaluation of his left big toe that after he was seen earlier by his home health nurse.  He does not know how he acquired this injury, but states that he has had it for at least 2 weeks.  He denies fever and night sweats, but feels chills and fatigue.  No headache, rhinorrhea, sore throat, wheezing, hemoptysis or dyspnea.  Denies chest pain, palpitations, dizziness, diaphoresis, PND, orthopnea or pitting edema of the lower extremities.  Denies abdominal pain, diarrhea, constipation, melena or hematochezia.  No dysuria, frequency or hematuria.  Denies blurred vision, polyuria, polydipsia, polyphagia. In ED initial vital signs were temperature 98.3 °F, pulse 67, respiration 18, blood pressure 125/74 mmHg and O2 sat 97% on room air.  He was given vancomycin in the emergency department. ° °Patient transfered from [facility] to [facility] on 01/22/2019 for further vascular surgery intervention. Discussed with Dr. Brabham--plans to do an arteriogram on 01/25/2019  - follow up with podiatry/ortho thereafter pending this evaluation. ° ° ° Subjective:  ° °No acute issues/events overnight, denies fever, chills, shortness of breath, headaches, nausea, vomiting, diarrhea, constipation.  Patient does indicate right foot pain but notes it is improving from previous on current regimen.  ° ° Assessment  & Plan  :  ° ° Principal Problem: °  Diabetic foot ulcer (HCC) °Active Problems: °  Type 2 diabetes mellitus (HCC) °  HTN (hypertension) °  Hyperlipidemia °  Anemia ° ° °Right great toe osteomyelitis-likely poorly healing diabetic foot ulcer, POA °-HOLD - IV Vanco, Flagyl and Rocephin (started 01/17/2019) °-MRI of right foot consistent with osteomyelitis  °-Vascular studies showed normal ABIs  °-continue local wound care. °-CRP WNL; ESR 29 (52 last week) °-Blood cultures NGTD °-Angiogram with runoff to delineate his ability to heal a wound and or heal a GT amputation. This is planned for Thursday with Dr. Cain.   °-Hold off on official surgical consultation until after arteriogram on 01/25/2019 °-Discussed with ID - will hold abx at this time to improve chances of obtaining viable culture in surgery. Following along. Appreciate insight/recs - likely vancomycin/ceftriaxone post-op until cultures result. ° °Type 2 Diabetes Mellitus, non-insulin-dependent °-A1c 6.3 reflecting diabetes is well controlled °-Previously decreased glipizide to 2.5 mg p.o. twice daily -even borderline hypoglycemia on well-controlled diet °-Continue to hold metformin-given possible need for contrast with concurrent vancomycin use, in attempt to avoid nephrotoxicity  °-Continue sliding scale insulin, hypoglycemic protocol, diabetic diet °   ° HTN (hypertension) °-Stable, °Continue Amlodipine 2.5 and metoprolol succinate 12.5 °Hold losartan as above to avoid nephrotoxicity °Monitor BP, HR, renal function electrolytes. °  °Hyperlipidemia °Continue atorvastatin 20 mg p.o. daily. ° °Chronic Anemia, likely anemia of chronic disease, stable °Follow H&H   °                                         ° ° Patient Demographics:  ° ° Wesley Ingram, is a 73 y.o. male, DOB - 01/07/1946, MRN:[id number] ° °Admit date - 01/17/2019   Admitting Physician David Manuel Ortiz, MD ° °Outpatient Primary MD for the patient is [facility] ° °LOS - 7 ° ° °Chief Complaint  °Patient presents with  °• Foot Pain  °    °Brief summary °Wesley Ingram is a 73 y.o. male with medical history significant of type 2 diabetes, hypertriglyceridemia, hypertension, renal disorder, history of CVA with residual left-sided hemiparesis who is coming to the emergency department for evaluation of his left big toe that after he was seen earlier by his home health nurse.  He does not know how he acquired this injury, but states that he has had it for at least 2 weeks.  He denies fever and night sweats, but feels chills and fatigue.  No headache, rhinorrhea, sore throat, wheezing, hemoptysis or dyspnea.  Denies chest pain, palpitations, dizziness, diaphoresis, PND, orthopnea or pitting edema of the lower extremities.  Denies abdominal pain, diarrhea, constipation, melena or hematochezia.  No dysuria, frequency or hematuria.  Denies blurred vision, polyuria, polydipsia, polyphagia. In ED initial vital signs were temperature 98.3 °F, pulse 67, respiration 18, blood pressure 125/74 mmHg and O2 sat 97% on room air.  He was given vancomycin in the emergency department. ° °Patient transfered from [facility] to [facility] on 01/22/2019 for further vascular surgery intervention. Discussed with Dr. Brabham--plans to do an arteriogram on 01/25/2019  - follow up with podiatry/ortho thereafter pending this evaluation. ° ° ° Subjective:  ° °No acute issues/events overnight, denies fever, chills, shortness of breath, headaches, nausea, vomiting, diarrhea, constipation.  Patient does indicate right foot pain but notes it is improving from previous on current regimen.  ° ° Assessment  & Plan  :  ° ° Principal Problem: °  Diabetic foot ulcer (HCC) °Active Problems: °  Type 2 diabetes mellitus (HCC) °  HTN (hypertension) °  Hyperlipidemia °  Anemia ° ° °Right great toe osteomyelitis-likely poorly healing diabetic foot ulcer, POA °-HOLD - IV Vanco, Flagyl and Rocephin (started 01/17/2019) °-MRI of right foot consistent with osteomyelitis  °-Vascular studies showed normal ABIs  °-continue local wound care. °-CRP WNL; ESR 29 (52 last week) °-Blood cultures NGTD °-Angiogram with runoff to delineate his ability to heal a wound and or heal a GT amputation. This is planned for Thursday with Dr. Cain.   °-Hold off on official surgical consultation until after arteriogram on 01/25/2019 °-Discussed with ID - will hold abx at this time to improve chances of obtaining viable culture in surgery. Following along. Appreciate insight/recs - likely vancomycin/ceftriaxone post-op until cultures result. ° °Type 2 Diabetes Mellitus, non-insulin-dependent °-A1c 6.3 reflecting diabetes is well controlled °-Previously decreased glipizide to 2.5 mg p.o. twice daily -even borderline hypoglycemia on well-controlled diet °-Continue to hold metformin-given possible need for contrast with concurrent vancomycin use, in attempt to avoid nephrotoxicity  °-Continue sliding scale insulin, hypoglycemic protocol, diabetic diet °   ° HTN (hypertension) °-Stable, °Continue Amlodipine 2.5 and metoprolol succinate 12.5 °Hold losartan as above to avoid nephrotoxicity °Monitor BP, HR, renal function electrolytes. °  °Hyperlipidemia °Continue atorvastatin 20 mg p.o. daily. ° °Chronic Anemia, likely anemia of chronic disease, stable °Follow H&H   Patient Demographics:    Wesley Ingram, is a 73 y.o. male, DOB - 1945/11/26, OQH:[id number]  Admit date - 01/17/2019   Admitting Physician Reubin Milan, MD  Outpatient [facility] the [facility]  LOS - 7   Chief Complaint  Patient presents with   Foot Pain      Brief summary Wesley Ingram is a 73 y.o. male with medical history significant of type 2 diabetes, hypertriglyceridemia, hypertension, renal disorder, history of CVA with residual left-sided hemiparesis who is coming to the emergency department for evaluation of his left big toe that after he was seen earlier by his home health nurse.  He does not know how he acquired this injury, but states that he has had it for at least 2 weeks.  He denies fever and night sweats, but feels chills and fatigue.  No headache, rhinorrhea, sore throat, wheezing, hemoptysis or dyspnea.  Denies chest pain, palpitations, dizziness, diaphoresis, PND, orthopnea or pitting edema of the lower extremities.  Denies abdominal pain, diarrhea, constipation, melena or hematochezia.  No dysuria, frequency or hematuria.  Denies blurred vision, polyuria, polydipsia, polyphagia. In ED initial vital signs were temperature 98.3 F, pulse 67, respiration 18, blood pressure 125/74 mmHg and O2 sat 97% on room air.  He was given vancomycin in the emergency department.  [facility] transfered [facility] on 01/22/2019 for further vascular surgery intervention.Discussed with Dr. Shary Key to do an arteriogram on 01/25/2019  - follow up with podiatry/ortho thereafter pending this evaluation.    Subjective:   No acute issues/events overnight, denies fever, chills, shortness of breath, headaches, nausea, vomiting, diarrhea, constipation.  Patient does indicate right foot pain but notes it is improving from previous on current regimen.    Assessment  & Plan  :    Principal Problem:   Diabetic foot ulcer ([place]) Active Problems:   Type 2 diabetes mellitus (HCC)   HTN (hypertension)   Hyperlipidemia   Anemia   Right great toe osteomyelitis-likely poorly healing diabetic foot ulcer, POA -HOLD - IV Vanco, Flagyl and Rocephin (started 01/17/2019) -MRI of right foot consistent with osteomyelitis  -Vascular studies showed normal ABIs  -continue local wound care. -CRP WNL; ESR 29 (52 last week) -Blood cultures NGTD -Angiogram with runoff to delineate his ability to heal a wound and or heal a GT amputation. This is planned for Thursday with Dr. Donzetta Matters.   -Hold off on official surgical consultation until after arteriogram on 01/25/2019 -Discussed with ID - will hold abx at this time to improve chances of obtaining viable culture in surgery. Following along. Appreciate insight/recs - likely vancomycin/ceftriaxone post-op until cultures result.  Type 2 Diabetes Mellitus, non-insulin-dependent -A1c 6.3 reflecting diabetes is well controlled -Previously decreased glipizide to 2.5 mg p.o. twice daily -even borderline hypoglycemia on well-controlled diet -Continue to hold metformin-given possible need for contrast with concurrent vancomycin use, in attempt to avoid nephrotoxicity  -Continue sliding scale insulin, hypoglycemic protocol, diabetic diet    HTN (hypertension) -Stable,

## 2019-01-25 ENCOUNTER — Inpatient Hospital Stay (HOSPITAL_COMMUNITY): Payer: Medicare Other | Admitting: Certified Registered Nurse Anesthetist

## 2019-01-25 ENCOUNTER — Encounter (HOSPITAL_COMMUNITY)
Admission: EM | Disposition: A | Discharge status: Skilled Nursing Facility | Payer: Self-pay | Source: Home / Self Care | Attending: Internal Medicine

## 2019-01-25 ENCOUNTER — Encounter (HOSPITAL_COMMUNITY): Payer: Self-pay

## 2019-01-25 HISTORY — PX: AMPUTATION: SHX166

## 2019-01-25 LAB — CBC
HCT: 37.5 % — ABNORMAL LOW (ref 39.0–52.0)
Hemoglobin: 12.3 g/dL — ABNORMAL LOW (ref 13.0–17.0)
MCH: 29.5 pg (ref 26.0–34.0)
MCHC: 32.8 g/dL (ref 30.0–36.0)
MCV: 89.9 fL (ref 80.0–100.0)
Platelets: 350 10*3/uL (ref 150–400)
RBC: 4.17 MIL/uL — ABNORMAL LOW (ref 4.22–5.81)
RDW: 13 % (ref 11.5–15.5)
WBC: 10.4 10*3/uL (ref 4.0–10.5)
nRBC: 0 % (ref 0.0–0.2)

## 2019-01-25 LAB — BASIC METABOLIC PANEL
Anion gap: 10 (ref 5–15)
BUN: 34 mg/dL — ABNORMAL HIGH (ref 8–23)
CO2: 24 mmol/L (ref 22–32)
Calcium: 9 mg/dL (ref 8.9–10.3)
Chloride: 104 mmol/L (ref 98–111)
Creatinine, Ser: 1.22 mg/dL (ref 0.61–1.24)
GFR calc Af Amer: >60 mL/min (ref >60)
GFR calc non Af Amer: 59 mL/min — ABNORMAL LOW (ref >60)
Glucose, Bld: 127 mg/dL — ABNORMAL HIGH (ref 70–99)
Potassium: 4.3 mmol/L (ref 3.5–5.1)
Sodium: 138 mmol/L (ref 135–145)

## 2019-01-25 LAB — PROTIME-INR
INR: 1.4 — ABNORMAL HIGH (ref 0.8–1.2)
Prothrombin Time: 16.9 seconds — ABNORMAL HIGH (ref 11.4–15.2)

## 2019-01-25 LAB — GLUCOSE, CAPILLARY
Glucose-Capillary: 116 mg/dL — ABNORMAL HIGH (ref 70–99)
Glucose-Capillary: 127 mg/dL — ABNORMAL HIGH (ref 70–99)
Glucose-Capillary: 178 mg/dL — ABNORMAL HIGH (ref 70–99)
Glucose-Capillary: 230 mg/dL — ABNORMAL HIGH (ref 70–99)
Glucose-Capillary: 201 mg/dL — ABNORMAL HIGH (ref 70–99)

## 2019-01-25 SURGERY — ABDOMINAL AORTOGRAM W/LOWER EXTREMITY
Anesthesia: LOCAL | Laterality: Bilateral

## 2019-01-25 SURGERY — AMPUTATION DIGIT
Anesthesia: Regional | Site: Foot | Laterality: Right

## 2019-01-25 MED ORDER — VANCOMYCIN HCL IN DEXTROSE 750-5 MG/150ML-% IV SOLN
750.0000 mg | Freq: Two times a day (BID) | INTRAVENOUS | Status: DC
  Administered 2019-01-25 – 2019-01-26 (×3): 750 mg via INTRAVENOUS
  Filled 2019-01-25 (×3): qty 150

## 2019-01-25 MED ORDER — PROPOFOL 10 MG/ML IV BOLUS
INTRAVENOUS | Status: AC
  Filled 2019-01-25: qty 20

## 2019-01-25 MED ORDER — PHENYLEPHRINE 40 MCG/ML (10ML) SYRINGE FOR IV PUSH (FOR BLOOD PRESSURE SUPPORT)
PREFILLED_SYRINGE | INTRAVENOUS | Status: DC | PRN
  Administered 2019-01-25 (×5): 80 ug via INTRAVENOUS

## 2019-01-25 MED ORDER — ONDANSETRON HCL 4 MG/2ML IJ SOLN
INTRAMUSCULAR | Status: AC
  Filled 2019-01-25: qty 2

## 2019-01-25 MED ORDER — FENTANYL CITRATE (PF) 100 MCG/2ML IJ SOLN
INTRAMUSCULAR | Status: AC
  Filled 2019-01-25: qty 2

## 2019-01-25 MED ORDER — LIDOCAINE HCL (PF) 1 % IJ SOLN
INTRAMUSCULAR | Status: AC
  Filled 2019-01-25: qty 30

## 2019-01-25 MED ORDER — PROPOFOL 10 MG/ML IV BOLUS
INTRAVENOUS | Status: DC | PRN
  Administered 2019-01-25 (×3): 10 mg via INTRAVENOUS
  Administered 2019-01-25: 20 mg via INTRAVENOUS
  Administered 2019-01-25 (×18): 10 mg via INTRAVENOUS

## 2019-01-25 MED ORDER — LIDOCAINE 2% (20 MG/ML) 5 ML SYRINGE
INTRAMUSCULAR | Status: AC
  Filled 2019-01-25: qty 5

## 2019-01-25 MED ORDER — LACTATED RINGERS IV SOLN
INTRAVENOUS | Status: DC
  Administered 2019-01-25: 09:00:00 via INTRAVENOUS

## 2019-01-25 MED ORDER — ONDANSETRON HCL 4 MG/2ML IJ SOLN
INTRAMUSCULAR | Status: DC | PRN
  Administered 2019-01-25: 4 mg via INTRAVENOUS

## 2019-01-25 MED ORDER — 0.9 % SODIUM CHLORIDE (POUR BTL) OPTIME
TOPICAL | Status: DC | PRN
  Administered 2019-01-25: 1000 mL

## 2019-01-25 MED ORDER — FENTANYL CITRATE (PF) 100 MCG/2ML IJ SOLN
INTRAMUSCULAR | Status: DC | PRN
  Administered 2019-01-25: 25 ug via INTRAVENOUS
  Administered 2019-01-25: 50 ug via INTRAVENOUS
  Administered 2019-01-25: 25 ug via INTRAVENOUS

## 2019-01-25 MED ORDER — LIDOCAINE HCL (CARDIAC) PF 100 MG/5ML IV SOSY
PREFILLED_SYRINGE | INTRAVENOUS | Status: DC | PRN
  Administered 2019-01-25: 40 mg via INTRAVENOUS

## 2019-01-25 MED ORDER — CEFAZOLIN SODIUM 1 G IJ SOLR
INTRAMUSCULAR | Status: AC
  Filled 2019-01-25: qty 10

## 2019-01-25 MED ORDER — CEFAZOLIN SODIUM-DEXTROSE 2-3 GM-%(50ML) IV SOLR
INTRAVENOUS | Status: DC | PRN
  Administered 2019-01-25: 2 g via INTRAVENOUS

## 2019-01-25 SURGICAL SUPPLY — 33 items
BLADE AVERAGE 25MMX9MM (BLADE)
BLADE AVERAGE 25X9 (BLADE) ×1 IMPLANT
BLADE SAW SGTL 81X20 HD (BLADE) IMPLANT
BNDG ELASTIC 4X5.8 VLCR STR LF (GAUZE/BANDAGES/DRESSINGS) ×3 IMPLANT
BNDG GAUZE ELAST 4 BULKY (GAUZE/BANDAGES/DRESSINGS) ×3 IMPLANT
CANISTER SUCT 3000ML PPV (MISCELLANEOUS) ×3 IMPLANT
COVER SURGICAL LIGHT HANDLE (MISCELLANEOUS) ×3 IMPLANT
COVER WAND RF STERILE (DRAPES) ×1 IMPLANT
DRAPE EXTREMITY T 121X128X90 (DISPOSABLE) ×3 IMPLANT
DRAPE HALF SHEET 40X57 (DRAPES) ×3 IMPLANT
DRSG EMULSION OIL 3X3 NADH (GAUZE/BANDAGES/DRESSINGS) ×2 IMPLANT
ELECT REM PT RETURN 9FT ADLT (ELECTROSURGICAL) ×3
ELECTRODE REM PT RTRN 9FT ADLT (ELECTROSURGICAL) ×1 IMPLANT
GAUZE SPONGE 4X4 12PLY STRL (GAUZE/BANDAGES/DRESSINGS) ×3 IMPLANT
GLOVE BIOGEL PI IND STRL 7.5 (GLOVE) ×1 IMPLANT
GLOVE BIOGEL PI INDICATOR 7.5 (GLOVE) ×2
GLOVE SURG SS PI 7.5 STRL IVOR (GLOVE) ×3 IMPLANT
GOWN STRL REUS W/ TWL LRG LVL3 (GOWN DISPOSABLE) ×2 IMPLANT
GOWN STRL REUS W/ TWL XL LVL3 (GOWN DISPOSABLE) ×1 IMPLANT
GOWN STRL REUS W/TWL LRG LVL3 (GOWN DISPOSABLE) ×6
GOWN STRL REUS W/TWL XL LVL3 (GOWN DISPOSABLE) ×3
KIT BASIN OR (CUSTOM PROCEDURE TRAY) ×3 IMPLANT
KIT TURNOVER KIT B (KITS) ×3 IMPLANT
NDL HYPO 25GX1X1/2 BEV (NEEDLE) IMPLANT
NEEDLE HYPO 25GX1X1/2 BEV (NEEDLE) IMPLANT
NS IRRIG 1000ML POUR BTL (IV SOLUTION) ×3 IMPLANT
PACK GENERAL/GYN (CUSTOM PROCEDURE TRAY) ×3 IMPLANT
PAD ARMBOARD 7.5X6 YLW CONV (MISCELLANEOUS) ×6 IMPLANT
SUT ETHILON 3 0 PS 1 (SUTURE) ×3 IMPLANT
SYR CONTROL 10ML LL (SYRINGE) IMPLANT
TOWEL GREEN STERILE (TOWEL DISPOSABLE) ×6 IMPLANT
UNDERPAD 30X30 (UNDERPADS AND DIAPERS) ×3 IMPLANT
WATER STERILE IRR 1000ML POUR (IV SOLUTION) ×3 IMPLANT

## 2019-01-25 NOTE — Transfer of Care (Signed)
Immediate Anesthesia Transfer of Care Note  Patient: Wesley Ingram  Procedure(s) Performed: AMPUTATION RIGHT GREAT TOE (Right Foot)  Patient Location: PACU  Anesthesia Type:MAC combined with regional for post-op pain  Level of Consciousness: drowsy  Airway & Oxygen Therapy: Patient Spontanous Breathing and Patient connected to nasal cannula oxygen  Post-op Assessment: Report given to RN and Post -op Vital signs reviewed and stable  Post vital signs: Reviewed and stable  Last Vitals:  Vitals Value Taken Time  BP 90/59 01/25/19 1049  Temp    Pulse    Resp 19 01/25/19 1051  SpO2    Vitals shown include unvalidated device data.  Last Pain:  Vitals:   01/25/19 0742  TempSrc:   PainSc: 0-No pain         Complications: No apparent anesthesia complications

## 2019-01-25 NOTE — Care Management (Addendum)
Hassan Rowan at [facility] called requesting update. Updated provided to [facility] [phone] ext [phone]. Brenda instructed NCM to continue to update Lanelle Bal at [facility] left message [place] social worker Lanelle Bal phone [phone] , fax [phone], updated . Marland Kitchen    Magdalen Spatz RN BSN

## 2019-01-25 NOTE — Plan of Care (Signed)

## 2019-01-25 NOTE — Anesthesia Postprocedure Evaluation (Signed)
Anesthesia Post Note  Patient: Wesley Ingram  Procedure(s) Performed: AMPUTATION RIGHT GREAT TOE (Right Foot)     Patient location during evaluation: PACU Anesthesia Type: Regional Level of consciousness: awake and alert Pain management: pain level controlled Vital Signs Assessment: post-procedure vital signs reviewed and stable Respiratory status: spontaneous breathing, nonlabored ventilation, respiratory function stable and patient connected to nasal cannula oxygen Cardiovascular status: stable and blood pressure returned to baseline Postop Assessment: no apparent nausea or vomiting Anesthetic complications: no    Last Vitals:  Vitals:   01/25/19 1115 01/25/19 1122  BP:  124/80  Pulse: 67 68  Resp: 15 18  Temp:  (!) 36.2 C  SpO2: 95% 95%    Last Pain:  Vitals:   01/25/19 1122  TempSrc:   PainSc: 0-No pain                 Emerlyn Mehlhoff COKER

## 2019-01-25 NOTE — TOC Progression Note (Addendum)
Transition of Care [facility]) - Progression Note    Patient Details  Name: Wesley Ingram MRN: [id number] Date of Birth: June 22, 1945  [facility] CM/SW Contact  Jacalyn Lefevre Edson Snowball, RN Phone Number: 01/25/2019, 3:24 PM  Clinical Narrative:     Provided patient with SNF bed offers .   He is interested in [facility] [phone] and [place] either offered.   Called Mardene Celeste at [facility] she will re look at referral. Patient had amputation today and will be here at least 2 days waiting on cultures, she may have a bed in a couple of days. Await call back.  Called Larene Beach at [facility] , they are having a meeting tomorrow regarding admissions, will call back tomorrow.   Patient does not want [facility].  Also reached out to [facility] and [facility], awaiting calls back.   [place] can offer a bed .  Expected Discharge Plan: Skilled Nursing Facility Barriers to Discharge: Continued Medical Work up  Expected Discharge Plan and Services Expected Discharge Plan: [place] In-house Referral: Nutrition Discharge Planning Services: CM Consult   Living arrangements for the past 2 months: Single Family Home                 DME Arranged: N/A         HH Arranged: NA           Social Determinants of Health (SDOH) Interventions    Readmission Risk Interventions No flowsheet data found.

## 2019-01-25 NOTE — Progress Notes (Signed)
Pharmacy Antibiotic Note  JOCOB DAMBACH is a 73 y.o. male admitted on 01/17/2019 with osteo now s/p toe amputation Had been on vanc, ceftriaxone, flagyl x 1 week at [facility]  Plan: vanc 750 mg q12h F/U LOT, ID recs?  Height: 5\' 10"  (177.8 cm) Weight: 161 lb 9.6 oz (73.3 kg) IBW/kg (Calculated) : 73  Temp (24hrs), Avg:97.7 F (36.5 C), Min:97.2 F (36.2 C), Max:98.4 F (36.9 C)  Recent Labs  Lab 01/19/19 2351 01/20/19 0604 01/20/19 0902 01/21/19 7628 01/22/19 0520 01/23/19 0456 01/24/19 0442 01/25/19 0517  WBC  --  10.0  --  9.3  --  10.0 9.3 10.4  CREATININE  --  1.15  --   --  1.07 1.32* 1.27* 1.22  VANCOTROUGH  --   --  17  --   --   --   --   --   VANCOPEAK 32  --   --   --   --   --   --   --     Estimated Creatinine Clearance: 57.3 mL/min (by C-G formula based on SCr of 1.22 mg/dL).    No Known Allergies  Levester Fresh, PharmD, BCPS, BCCCP Clinical Pharmacist [phone]  Please check AMION for all [place] numbers  01/25/2019 1:57 PM

## 2019-01-25 NOTE — Anesthesia Procedure Notes (Signed)
Anesthesia Regional Block: Ankle block   Pre-Anesthetic Checklist: ,, timeout performed, Correct Patient, Correct Site, Correct Laterality, Correct Procedure, Correct Position, site marked, Risks and benefits discussed,  Surgical consent,  Pre-op evaluation,  At surgeon's request and post-op pain management  Laterality: Right  Prep: chloraprep       Needles:   Needle Type: Tuohy      Needle Gauge: 25     Additional Needles:   Narrative:  Start time: 01/25/2019 9:20 AM End time: 01/25/2019 9:30 AM Injection made incrementally with aspirations every 5 mL.  Performed by: Personally   Additional Notes: 25 cc 2% lidocaine plain injected easily

## 2019-01-25 NOTE — Interval H&P Note (Signed)
History and Physical Interval Note:  01/25/2019 9:09 AM  Wesley Ingram  has presented today for surgery, with the diagnosis of NON-VIABLE TISSUE RIGHT GREAT TOE.  The various methods of treatment have been discussed with the patient and family. After consideration of risks, benefits and other options for treatment, the patient has consented to  Procedure(s): AMPUTATION RIGHT GREAT TOE (Right) as a surgical intervention.  The patient's history has been reviewed, patient examined, no change in status, stable for surgery.  I have reviewed the patient's chart and labs.  Questions were answered to the patient's satisfaction.     Annamarie Major

## 2019-01-25 NOTE — Plan of Care (Signed)
  Problem: Elimination: Goal: Will not experience complications related to bowel motility Outcome: Progressing Goal: Will not experience complications related to urinary retention Outcome: Progressing   Problem: Safety: Goal: Ability to remain free from injury will improve Outcome: Progressing   Problem: Skin Integrity: Goal: Risk for impaired skin integrity will decrease Outcome: Progressing   

## 2019-01-25 NOTE — Anesthesia Preprocedure Evaluation (Signed)
Anesthesia Evaluation  Patient identified by MRN, date of birth, ID band Patient awake    Reviewed: Allergy & Precautions, NPO status , Patient's Chart, lab work & pertinent test results  Airway Mallampati: II  TM Distance: >3 FB Neck ROM: Full    Dental  (+) Edentulous Upper, Dental Advisory Given   Pulmonary    breath sounds clear to auscultation       Cardiovascular hypertension,  Rhythm:Regular Rate:Normal     Neuro/Psych    GI/Hepatic   Endo/Other  diabetes  Renal/GU      Musculoskeletal   Abdominal   Peds  Hematology   Anesthesia Other Findings   Reproductive/Obstetrics                             Anesthesia Physical Anesthesia Plan  ASA: III  Anesthesia Plan: Regional   Post-op Pain Management:    Induction: Intravenous  PONV Risk Score and Plan: Propofol infusion  Airway Management Planned: Simple Face Mask and Natural Airway  Additional Equipment:   Intra-op Plan:   Post-operative Plan:   Informed Consent: I have reviewed the patients History and Physical, chart, labs and discussed the procedure including the risks, benefits and alternatives for the proposed anesthesia with the patient or authorized representative who has indicated his/her understanding and acceptance.     Dental advisory given  Plan Discussed with: CRNA and Anesthesiologist  Anesthesia Plan Comments:         Anesthesia Quick Evaluation

## 2019-01-25 NOTE — Progress Notes (Signed)
Patient Demographics:    Wesley Ingram, is a 73 y.o. male, DOB - June 09, 1946, ZOX:[id number]  Admit date - 01/17/2019   Admitting Physician Bobette Mo, MD  Outpatient Primary MD for the [facility] is [facility]  LOS - 8   Chief Complaint  Patient presents with  . Foot Pain      Brief summary Wesley Ingram is a 73 y.o. male with medical history significant of type 2 diabetes, hypertriglyceridemia, hypertension, renal disorder, history of CVA with residual left-sided hemiparesis who is coming to the emergency department for evaluation of his left big toe that after he was seen earlier by his home health nurse.  He does not know how he acquired this injury, but states that he has had it for at least 2 weeks.  He denies fever and night sweats, but feels chills and fatigue.  No headache, rhinorrhea, sore throat, wheezing, hemoptysis or dyspnea.  Denies chest pain, palpitations, dizziness, diaphoresis, PND, orthopnea or pitting edema of the lower extremities.  Denies abdominal pain, diarrhea, constipation, melena or hematochezia.  No dysuria, frequency or hematuria.  Denies blurred vision, polyuria, polydipsia, polyphagia. In ED initial vital signs were temperature 98.3 F, pulse 67, respiration 18, blood pressure 125/74 mmHg and O2 sat 97% on room air.  He was given vancomycin in the emergency department.  Patient transfered from [facility] to [facility] on 01/22/2019 for further vascular surgery intervention.Discussed with Dr. Seward Meth to do an arteriogram on 01/25/2019  - follow up with podiatry/ortho thereafter pending this evaluation.    Subjective:   No acute issues/events overnight, denies fever, chills, shortness of breath, headaches, nausea, vomiting, diarrhea, constipation.   Assessment  & Plan :    Principal Problem:   Diabetic foot ulcer (HCC) Active Problems:   Type 2 diabetes mellitus  (HCC)   HTN (hypertension)   Hyperlipidemia   Anemia   Right great toe osteomyelitis-likely poorly healing diabetic foot ulcer, POA -HOLD - IV Vanco, Flagyl and Rocephin (started 01/17/2019) -MRI of right foot consistent with osteomyelitis  -Vascular studies showed normal ABIs  -continue local wound care. -CRP WNL; ESR 29 (52 last week) -Blood cultures NGTD -Right great toe amputation 01/25/2019 -Discussed with ID - held abx prior to surgery in hopes for more viable cultute.  Resume vancomycin given history of MRSA until cultures result  Type 2 Diabetes Mellitus, non-insulin-dependent -A1c 6.3 reflecting diabetes is well controlled -Previously decreased glipizide to 2.5 mg p.o. twice daily -even borderline hypoglycemia on well-controlled diet -Continue to hold metformin-given possible need for contrast with concurrent vancomycin use, in attempt to avoid nephrotoxicity  -Continue sliding scale insulin, hypoglycemic protocol, diabetic diet    HTN (hypertension) -Stable, Continue Amlodipine 2.5 and metoprolol succinate 12.5 Hold losartan as above to avoid nephrotoxicity Monitor BP, HR, renal function electrolytes.  Hyperlipidemia Continue atorvastatin 20 mg p.o. daily.  Chronic Anemia, likely anemia of chronic disease, stable Follow H&H with daily labs  History of prior CVA/chronic anticoagulation History of DVT Currently holding Eliquis as above - pending procedure  Diarrhea - Appears to be somewhat chronic, follow cultures; following with GI in the outpatient setting  DVT prophylaxis:  Currently holding Eliquis as above Code Status: Full code Disposition Plan:  Further evaluation treatment  and clinical improvement of osteomyelitis, tentative disposition will be dependent on patient's ambulatory status and need for prolonged IV antibiotics inpatient versus outpatient setting.  At this time patient continues to require IV antibiotics, surgical evaluation and likely intervention  prior to disposition. Consults: Vascular surgery  Inpatient Medications  Scheduled Meds: . amLODipine  2.5 mg Oral Daily  . atorvastatin  20 mg Oral q morning - 10a  . feeding supplement (PRO-STAT SUGAR FREE 64)  30 mL Oral BID  . glipiZIDE  2.5 mg Oral BID AC  . insulin aspart  0-15 Units Subcutaneous TID WC  . metoprolol succinate  12.5 mg Oral Daily  . multivitamin with minerals  1 tablet Oral Daily  . nutrition supplement (JUVEN)  1 packet Oral BID BM   Continuous Infusions: . sodium chloride Stopped (01/23/19 0617)  . lactated ringers 10 mL/hr at 01/25/19 0840  . vancomycin     PRN Meds:.sodium chloride, acetaminophen **OR** acetaminophen, loperamide, Melatonin, polyvinyl alcohol   Anti-infectives (From admission, onward)   Start     Dose/Rate Route Frequency Ordered Stop   01/25/19 1500  vancomycin (VANCOCIN) IVPB 750 mg/150 ml premix     750 mg 150 mL/hr over 60 Minutes Intravenous Every 12 hours 01/25/19 1355     01/18/19 1000  vancomycin (VANCOCIN) IVPB 750 mg/150 ml premix  Status:  Discontinued     750 mg 150 mL/hr over 60 Minutes Intravenous Every 12 hours 01/18/19 0847 01/23/19 1348   01/18/19 0800  vancomycin (VANCOCIN) IVPB 750 mg/150 ml premix  Status:  Discontinued     750 mg 150 mL/hr over 60 Minutes Intravenous Every 12 hours 01/17/19 2235 01/18/19 0847   01/17/19 2230  cefTRIAXone (ROCEPHIN) 2 g in sodium chloride 0.9 % 100 mL IVPB  Status:  Discontinued     2 g 200 mL/hr over 30 Minutes Intravenous Every 24 hours 01/17/19 2224 01/23/19 1348   01/17/19 2230  metroNIDAZOLE (FLAGYL) tablet 500 mg  Status:  Discontinued     500 mg Oral Every 8 hours 01/17/19 2224 01/23/19 1348   01/17/19 2215  vancomycin (VANCOCIN) IVPB 1000 mg/200 mL premix     1,000 mg 200 mL/hr over 60 Minutes Intravenous  Once 01/17/19 2208 01/17/19 2340        Objective:   Vitals:   01/25/19 1051 01/25/19 1106 01/25/19 1115 01/25/19 1122  BP: (!) 90/59 125/74  124/80  Pulse: 61  (!) 59 67 68  Resp: 19 15 15 18   Temp: 97.7 F (36.5 C)   (!) 97.2 F (36.2 C)  TempSrc:      SpO2: 99% 95% 95% 95%  Weight:      Height:        Wt Readings from Last 3 Encounters:  01/25/19 73.3 kg  01/16/19 72.6 kg  12/30/18 74.8 kg    Intake/Output Summary (Last 24 hours) at 01/25/2019 1359 Last data filed at 01/25/2019 1126 Gross per 24 hour  Intake 730 ml  Output 1310 ml  Net -580 ml   Physical Exam Gen:- Awake Alert,  In no apparent distress  HEENT:- [place].AT, No sclera icterus Neck-Supple Neck,No JVD,.  Lungs-  CTAB , fair symmetrical air movement CV- S1, S2 normal, regular  Abd-  +ve B.Sounds, Abd Soft, No tenderness,    Extremity--pedal pulses present R foot bandage clean,dry,intact Psych-affect is appropriate, oriented x3 Neuro-Chronic Lt sided Hemiparesis, but no new focal deficits, no tremors    Data Review:   Micro Results Recent Results (from  the past 240 hour(s))  Blood culture (routine x 2)     Status: None   Collection Time: 01/17/19  8:48 PM   Specimen: BLOOD RIGHT ARM  Result Value Ref Range Status   Specimen Description BLOOD RIGHT ARM  Final   Special Requests   Final    BOTTLES DRAWN AEROBIC AND ANAEROBIC Blood Culture adequate volume   Culture   Final    NO GROWTH 5 DAYS Performed at [facility], [address] [place], [place] [address]    Report Status 01/22/2019 FINAL  Final  Blood culture (routine x 2)     Status: None   Collection Time: 01/17/19  8:57 PM   Specimen: BLOOD RIGHT HAND  Result Value Ref Range Status   Specimen Description BLOOD RIGHT HAND  Final   Special Requests   Final    BOTTLES DRAWN AEROBIC AND ANAEROBIC Blood Culture adequate volume   Culture   Final    NO GROWTH 5 DAYS Performed at [facility], [address] [place], [place] [address]    Report Status 01/22/2019 FINAL  Final  SARS CORONAVIRUS 2 Nasal Swab Aptima Multi Swab     Status: None   Collection Time: 01/18/19 12:39 AM   Specimen: Aptima Multi  Swab; Nasal Swab  Result Value Ref Range Status   SARS Coronavirus 2 NEGATIVE NEGATIVE Final    Comment: (NOTE) SARS-CoV-2 target nucleic acids are NOT DETECTED. The SARS-CoV-2 RNA is generally detectable in upper and lower respiratory specimens during the acute phase of infection. Negative results do not preclude SARS-CoV-2 infection, do not rule out co-infections with other pathogens, and should not be used as the sole basis for treatment or other patient management decisions. Negative results must be combined with clinical observations, patient history, and epidemiological information. The expected result is Negative. Fact Sheet for Patients: HairSlick.no Fact Sheet for Healthcare Providers: quierodirigir.com This test is not yet approved or cleared by the [place] FDA and  has been authorized for detection and/or diagnosis of SARS-CoV-2 by FDA under an Emergency Use Authorization (EUA). This EUA will remain  in effect (meaning this test can be used) for the duration of the COVID-19 declaration under Section 56 4(b)(1) of the Act, 21 U.S.C. section 360bbb-3(b)(1), unless the authorization is terminated or revoked sooner. Performed at [facility], [address], [place], [place] [address]   MRSA PCR Screening     Status: None   Collection Time: 01/19/19  6:38 AM   Specimen: Nasal Mucosa; Nasopharyngeal  Result Value Ref Range Status   MRSA by PCR NEGATIVE NEGATIVE Final    Comment:        The GeneXpert MRSA Assay (FDA approved for NASAL specimens only), is one component of a comprehensive MRSA colonization surveillance program. It is not intended to diagnose MRSA infection nor to guide or monitor treatment for MRSA infections. Performed at [facility], [address], [place], [place] [address]   Gastrointestinal Panel by PCR , Stool     Status: None   Collection Time: 01/20/19  5:37 PM   Specimen:  Stool  Result Value Ref Range Status   Campylobacter species NOT DETECTED NOT DETECTED Final   Plesimonas shigelloides NOT DETECTED NOT DETECTED Final   Salmonella species NOT DETECTED NOT DETECTED Final   Yersinia enterocolitica NOT DETECTED NOT DETECTED Final   Vibrio species NOT DETECTED NOT DETECTED Final   Vibrio cholerae NOT DETECTED NOT DETECTED Final   Enteroaggregative E coli (EAEC)  NOT DETECTED NOT DETECTED Final   Enteropathogenic E coli (EPEC) NOT DETECTED NOT DETECTED Final   Enterotoxigenic E coli (ETEC) NOT DETECTED NOT DETECTED Final   Shiga like toxin producing E coli (STEC) NOT DETECTED NOT DETECTED Final   Shigella/Enteroinvasive E coli (EIEC) NOT DETECTED NOT DETECTED Final   Cryptosporidium NOT DETECTED NOT DETECTED Final   Cyclospora cayetanensis NOT DETECTED NOT DETECTED Final   Entamoeba histolytica NOT DETECTED NOT DETECTED Final   Giardia lamblia NOT DETECTED NOT DETECTED Final   Adenovirus F40/41 NOT DETECTED NOT DETECTED Final   Astrovirus NOT DETECTED NOT DETECTED Final   Norovirus GI/GII NOT DETECTED NOT DETECTED Final   Rotavirus A NOT DETECTED NOT DETECTED Final   Sapovirus (I, II, IV, and V) NOT DETECTED NOT DETECTED Final    Comment: Performed at [facility], [address], [place], [place] [address]    Radiology Reports Mr Foot Right Wo Contrast  Result Date: 01/18/2019 CLINICAL DATA:  Diabetic patient with an open wound on the right great toe. EXAM: MRI OF THE RIGHT FOREFOOT WITHOUT CONTRAST TECHNIQUE: Multiplanar, multisequence MR imaging of the right forefoot was performed. No intravenous contrast was administered. COMPARISON:  Plain films right foot 01/17/2019. FINDINGS: Bones/Joint/Cartilage Motion degrades the examination. There is marrow edema throughout the distal phalanx of the great toe consistent with osteomyelitis. No other evidence of osteomyelitis is identified. Ligaments Intact. Muscles and Tendons Intact. No intramuscular fluid  collection. 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 Soft tissues Skin wound at the distal aspect of the great toe is identified. No abscess is seen. IMPRESSION: Motion degraded examination demonstrates marrow edema throughout the distal phalanx of the great toe consistent with osteomyelitis. No abscess is identified. No evidence of septic joint. Electronically Signed   By: Drusilla Kanner M.D.   On: 01/18/2019 07:35   US Arterial Abi (screening Lower Extremity)  Result Date: 01/18/2019 CLINICAL DATA:  Right diabetic open foot wound, great toe. Previous amputation. EXAM: NONINVASIVE PHYSIOLOGIC VASCULAR STUDY OF BILATERAL LOWER EXTREMITIES TECHNIQUE: Evaluation of both lower extremities were performed at rest, including calculation of ankle-brachial indices with single level Doppler, pressure and pulse volume recording. COMPARISON:  None. FINDINGS: Right ABI:  1.28 Left ABI:  1.16 Right Lower Extremity:  Multiphasic waveform at the ankle. Left Lower Extremity:  Multiphasic waveform at the ankle. IMPRESSION: Normal bilateral ABIs. Vascular calcification and resultant limited compressibility in the setting of diabetes can result in elevated values however. Electronically Signed   By: Corlis Leak M.D.   On: 01/18/2019 09:50   Dg Foot Complete Right  Result Date: 01/17/2019 CLINICAL DATA:  Swelling and drainage from right great toe. History of diabetes. EXAM: RIGHT FOOT COMPLETE - 3+ VIEW COMPARISON:  Radiographs 12/30/2018 FINDINGS: No acute fracture or traumatic malalignment. There is soft tissue swelling and ulceration at the tip of the first digit. No subcutaneous gas few punctate radiodensities are present within the soft tissues which are new from prior exam and could reflect debris or foreign body. No bony erosion, destructive change or immature periostitis to suggest early radiographic features of osteomyelitis. There is diffuse  metatarsophalangeal and interphalangeal arthrosis. Hammertoe deformities of the third and fourth digits. May mild mid foot arthrosis is present as well. Please note alignment of the mid and hindfoot is incompletely assessed on nonweightbearing radiographs. Calcaneal spurs are present at the Achilles and plantar ligament insertions. IMPRESSION: 1. Soft tissue swelling and ulceration at  the tip of the first digit. Few punctate radiodensities within the soft tissues are new from prior exam and could reflect debris or foreign body and should be correlated with visual inspection. 2. No radiographic evidence of osteomyelitis. MRI is more sensitive and specific for early findings of osteomyelitis. Electronically Signed   By: Kreg Shropshire M.D.   On: 01/17/2019 21:41   Dg Foot Complete Right  Result Date: 12/30/2018 CLINICAL DATA:  Infection, right great toe EXAM: RIGHT FOOT COMPLETE - 3+ VIEW COMPARISON:  None. FINDINGS: No fracture or dislocation of the right foot. No bony erosion or sclerosis of the great toe to suggest osteomyelitis. There is moderate first metatarsophalangeal arthrosis. Mild midfoot arthrosis. The remaining joint spaces are well preserved. Mild soft tissue edema of the great toe. IMPRESSION: No fracture or dislocation of the right foot. No bony erosion or sclerosis of the great toe to suggest osteomyelitis. There is moderate first metatarsophalangeal arthrosis. Mild midfoot arthrosis. The remaining joint spaces are well preserved. Mild soft tissue edema of the great toe. MRI may be used to more sensitively evaluate for bone marrow edema and osteomyelitis if suspected. Electronically Signed   By: Lauralyn Primes M.D.   On: 12/30/2018 13:10    CBC Recent Labs  Lab 01/19/19 0507 01/20/19 0604 01/21/19 0611 01/23/19 0456 01/24/19 0442 01/25/19 0517  WBC 13.2* 10.0 9.3 10.0 9.3 10.4  HGB 11.4* 11.0* 11.8* 12.0* 12.1* 12.3*  HCT 36.2* 35.0* 37.2* 36.3* 36.2* 37.5*  PLT 344 345 350 349 348 350   MCV 92.3 90.7 91.6 89.4 88.5 89.9  MCH 29.1 28.5 29.1 29.6 29.6 29.5  MCHC 31.5 31.4 31.7 33.1 33.4 32.8  RDW 12.8 13.0 12.9 12.7 12.7 13.0  LYMPHSABS 1.3 1.8 1.8  --   --   --   MONOABS 1.6* 1.3* 1.3*  --   --   --   EOSABS 0.5 0.5 0.6*  --   --   --   BASOSABS 0.1 0.1 0.1  --   --   --     Chemistries  Recent Labs  Lab 01/20/19 0604 01/22/19 0520 01/23/19 0456 01/24/19 0442 01/25/19 0517  NA 138 138 138 137 138  K 3.8 4.2 4.3 3.9 4.3  CL 105 106 102 104 104  CO2 26 24 25 23 24   GLUCOSE 162* 129* 111* 139* 127*  BUN 31* 32* 37* 37* 34*  CREATININE 1.15 1.07 1.32* 1.27* 1.22  CALCIUM 8.9 8.9 9.0 8.9 9.0     Kristine Garbe Syvilla Martin DO on 01/25/2019 at 1:59 PM

## 2019-01-25 NOTE — Op Note (Signed)
Patient name: Wesley Ingram MRN: [id number] DOB: 1946-01-19 Sex: male  01/25/2019 Pre-operative Diagnosis: Right great toe ulcer Post-operative diagnosis:  Same Surgeon:  Durene Cal Assistants: Deniece Ree, RNFA Procedure:   Right great toe amputation Anesthesia: Ankle block Blood Loss: Minimal Specimens: Right great toe  Findings: Excellent capillary bleeding.  The bone was healthy.  Indications: This is a 73 year old diabetic who has an infected right great toe with ulcer and osteomyelitis.  He has a palpable pulse.  I recommend proceeding with primary amputation.  I had an extensive discussion with the patient who was initially reluctant to consent to the procedure.  I told him that if this is not addressed able more than likely have the infection spread onto the foot which could lead to a more proximal amputation.  Ultimately patient understood and wanted to have this done today.  Procedure:  The patient was identified in the holding area and taken to [facility] OR ROOM 16  The patient was then placed supine on the table. Ankle block anesthesia was administered.  The patient was prepped and draped in the usual sterile fashion.  A time out was called and antibiotics were administered.  An elliptical incision was made at the base of the right great toe with a 10 blade.  The incision was taken down to the bone and large bone cutters were used to transect the proximal bone.  The bone appeared to be very healthy.  There was excellent capillary bleeding.  I then used rongeurs to debride back bone and then used a rasp to smooth the edges.  I did use cautery to help with hemostasis as there was excellent bleeding.  Will was then irrigated.  The incision was then reapproximated using interrupted 3 oh vertical nylon sutures.  Sterile dressings were applied.   Disposition: To PACU stable   V. Durene Cal, M.D., [facility] [place] Office: [phone] Pager:   [phone]

## 2019-01-26 ENCOUNTER — Encounter (HOSPITAL_COMMUNITY): Payer: Self-pay | Admitting: Surgery

## 2019-01-26 LAB — GLUCOSE, CAPILLARY
Glucose-Capillary: 115 mg/dL — ABNORMAL HIGH (ref 70–99)
Glucose-Capillary: 198 mg/dL — ABNORMAL HIGH (ref 70–99)
Glucose-Capillary: 210 mg/dL — ABNORMAL HIGH (ref 70–99)
Glucose-Capillary: 210 mg/dL — ABNORMAL HIGH (ref 70–99)

## 2019-01-26 LAB — CBC
HCT: 34.6 % — ABNORMAL LOW (ref 39.0–52.0)
Hemoglobin: 11.4 g/dL — ABNORMAL LOW (ref 13.0–17.0)
MCH: 29.5 pg (ref 26.0–34.0)
MCHC: 32.9 g/dL (ref 30.0–36.0)
MCV: 89.6 fL (ref 80.0–100.0)
Platelets: 300 10*3/uL (ref 150–400)
RBC: 3.86 MIL/uL — ABNORMAL LOW (ref 4.22–5.81)
RDW: 13.1 % (ref 11.5–15.5)
WBC: 12 10*3/uL — ABNORMAL HIGH (ref 4.0–10.5)
nRBC: 0 % (ref 0.0–0.2)

## 2019-01-26 LAB — BASIC METABOLIC PANEL
Anion gap: 9 (ref 5–15)
BUN: 32 mg/dL — ABNORMAL HIGH (ref 8–23)
CO2: 24 mmol/L (ref 22–32)
Calcium: 8.7 mg/dL — ABNORMAL LOW (ref 8.9–10.3)
Chloride: 103 mmol/L (ref 98–111)
Creatinine, Ser: 1.16 mg/dL (ref 0.61–1.24)
GFR calc Af Amer: >60 mL/min (ref >60)
GFR calc non Af Amer: >60 mL/min (ref >60)
Glucose, Bld: 112 mg/dL — ABNORMAL HIGH (ref 70–99)
Potassium: 4.2 mmol/L (ref 3.5–5.1)
Sodium: 136 mmol/L (ref 135–145)

## 2019-01-26 NOTE — Progress Notes (Signed)
Orthopedic Tech Progress Note Patient Details:  Wesley Ingram 22-Jul-1945 [id number]  Patient ID: [name], male   DOB: 03/15/1946, 73 y.o.   MRN: [id number]   [place] Pink 01/26/2019, 11:52 AMCalled [employer] for right heel shoe

## 2019-01-26 NOTE — TOC Progression Note (Addendum)
Transition of Care [facility]) - Progression Note    Patient Details  Name: Wesley Ingram MRN: [id number] Date of Birth: 1946-04-19  Transition of Care [facility]) CM/SW Contact  Brandelyn Henne, Edson Snowball, RN Phone Number: 01/26/2019, 2:54 PM  Clinical Narrative:     Patient has accepted a bed at [facility] , expected discharge is Monday January 29, 2019. Kerri at [facility] aware . MD will order covid test .  Patient requested NCM to call wife and provide update. Same done.  Expected Discharge Plan: Skilled Nursing Facility Barriers to Discharge: Continued Medical Work up  Expected Discharge Plan and Services Expected Discharge Plan: [place] In-house Referral: Nutrition Discharge Planning Services: CM Consult   Living arrangements for the past 2 months: Single Family Home                 DME Arranged: N/A         HH Arranged: NA           Social Determinants of Health (SDOH) Interventions    Readmission Risk Interventions No flowsheet data found.

## 2019-01-26 NOTE — Progress Notes (Signed)
Patient Demographics:    Wesley Ingram, is a 73 y.o. male, DOB - January 01, 1946, ZOX:[id number]  Admit date - 01/17/2019   Admitting Physician Bobette Mo, MD  [facility]  LOS - 9   Chief Complaint  Patient presents with  . Foot Pain      Brief summary Wesley Ingram is a 73 y.o. male with medical history significant of type 2 diabetes, hypertriglyceridemia, hypertension, renal disorder, history of CVA with residual left-sided hemiparesis who is coming to the emergency department for evaluation of his left big toe that after he was seen earlier by his home health nurse.  He does not know how he acquired this injury, but states that he has had it for at least 2 weeks.  He denies fever and night sweats, but feels chills and fatigue.  No headache, rhinorrhea, sore throat, wheezing, hemoptysis or dyspnea.  Denies chest pain, palpitations, dizziness, diaphoresis, PND, orthopnea or pitting edema of the lower extremities.  Denies abdominal pain, diarrhea, constipation, melena or hematochezia.  No dysuria, frequency or hematuria.  Denies blurred vision, polyuria, polydipsia, polyphagia. In ED initial vital signs were temperature 98.3 F, pulse 67, respiration 18, blood pressure 125/74 mmHg and O2 sat 97% on room air.  He was given vancomycin in the emergency department.  Patient transfered from [facility] to [facility] on 01/22/2019 for further vascular surgery intervention.Discussed with Dr. Seward Meth to do an arteriogram on 01/25/2019  - follow up with podiatry/ortho thereafter pending this evaluation.    Subjective:   No acute issues/events overnight, denies fever, chills, shortness of breath, headaches, nausea, vomiting, diarrhea, constipation.   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  Resume vancomycin given history of MRSA until operative cultures result -Vasc-Sx - heel weightbearing only - ok to DC from their standpoint  Type 2 Diabetes Mellitus, non-insulin-dependent -A1c 6.3 reflecting diabetes is well controlled -Previously decreased glipizide to 2.5 mg p.o. twice daily  -Continue to hold metformin-given possible need for contrast with concurrent vancomycin use, in attempt to avoid nephrotoxicity  -Continue sliding scale insulin, hypoglycemic protocol, diabetic diet    HTN (hypertension) -Stable, -Continue Amlodipine 2.5 and metoprolol succinate 12.5 -Hold losartan as above to avoid nephrotoxicity -Monitor BP, HR, renal function electrolytes.  Hyperlipidemia -Continue atorvastatin 20 mg p.o. daily.  Chronic Anemia, likely anemia of chronic disease, stable -Follow H&H with daily labs  History of prior CVA/chronic anticoagulation History of DVT -Currently holding Eliquis as above - pending procedure  Diarrhea - Appears to be somewhat chronic, follow cultures; following with GI in the outpatient setting  DVT prophylaxis:  Currently holding Eliquis as above - resume once ok per  Sx standpoint Code Status: Full code Disposition Plan:  Pending clinical improvement of osteomyelitis, tentative disposition will be dependent on patient's ambulatory status and need for prolonged IV antibiotics inpatient versus outpatient setting.  Consults: Vascular surgery  Inpatient Medications  Scheduled Meds:  . amLODipine  2.5 mg Oral Daily  . atorvastatin  20 mg Oral q morning - 10a  . feeding supplement (PRO-STAT SUGAR FREE 64)  30 mL Oral BID  . glipiZIDE  2.5 mg Oral BID AC  . insulin aspart  0-15 Units Subcutaneous TID WC  . metoprolol succinate  12.5 mg Oral Daily  . multivitamin with minerals  1 tablet Oral Daily  . nutrition supplement (JUVEN)  1 packet Oral BID BM   Continuous Infusions: . sodium chloride Stopped (01/23/19 0617)  . lactated ringers Stopped (01/25/19 1500)  . vancomycin 750 mg (01/26/19 0229)   PRN Meds:.sodium chloride, acetaminophen **OR** acetaminophen, loperamide, Melatonin, polyvinyl alcohol   Anti-infectives (From admission, onward)   Start     Dose/Rate Route Frequency Ordered Stop   01/25/19 1500  vancomycin (VANCOCIN) IVPB 750 mg/150 ml premix     750 mg 150 mL/hr over 60 Minutes Intravenous Every 12 hours 01/25/19 1355     01/18/19 1000  vancomycin (VANCOCIN) IVPB 750 mg/150 ml premix  Status:  Discontinued     750 mg 150 mL/hr over 60 Minutes Intravenous Every 12 hours 01/18/19 0847 01/23/19 1348   01/18/19 0800  vancomycin (VANCOCIN) IVPB 750 mg/150 ml premix  Status:  Discontinued     750 mg 150 mL/hr over 60 Minutes Intravenous Every 12 hours 01/17/19 2235 01/18/19 0847   01/17/19 2230  cefTRIAXone (ROCEPHIN) 2 g in sodium chloride 0.9 % 100 mL IVPB  Status:  Discontinued     2 g 200 mL/hr over 30 Minutes Intravenous Every 24 hours 01/17/19 2224 01/23/19 1348   01/17/19 2230  metroNIDAZOLE (FLAGYL) tablet 500 mg  Status:  Discontinued     500 mg Oral Every 8 hours 01/17/19 2224 01/23/19 1348   01/17/19 2215  vancomycin (VANCOCIN) IVPB 1000 mg/200 mL premix     1,000 mg 200 mL/hr over 60 Minutes Intravenous  Once 01/17/19 2208 01/17/19 2340        Objective:   Vitals:   01/25/19 1122 01/25/19 1500 01/25/19 2129 01/26/19 0415  BP: 124/80 132/72 (!) 110/59 122/64  Pulse: 68 64 68 72  Resp: 18 18 16 18   Temp: (!) 97.2 F (36.2 C) (!) 97.4  F (36.3 C) 98.3 F (36.8 C) 98.6 F (37 C)  TempSrc:  Oral Oral Oral  SpO2: 95% 97% 95% 97%  Weight:      Height:        Wt Readings from Last 3 Encounters:  01/25/19 73.3 kg  01/16/19 72.6 kg  12/30/18 74.8 kg    Intake/Output Summary (Last 24 hours) at 01/26/2019 0816 Last data filed at 01/25/2019 2143 Gross per 24 hour  Intake 1101.4 ml  Output 1711 ml  Net -609.6 ml   Physical Exam Gen:- Awake Alert,  In no apparent distress  HEENT:- [place].AT, No sclera icterus Neck-Supple Neck,No JVD,.  Lungs-  CTAB , fair symmetrical air movement CV- S1, S2 normal, regular  Abd-  +ve B.Sounds, Abd Soft, No tenderness,    Extremity--pedal pulses present R foot bandage minimal blood noted - wound appears approximated without bleeding or purulence. Psych-affect is appropriate, oriented x3 Neuro-Chronic Lt sided Hemiparesis, but no new focal deficits, no tremors    Data  Review:   Micro Results Recent Results (from the past 240 hour(s))  Blood culture (routine x 2)     Status: None   Collection Time: 01/17/19  8:48 PM   Specimen: BLOOD RIGHT ARM  Result Value Ref Range Status   Specimen Description BLOOD RIGHT ARM  Final   Special Requests   Final    BOTTLES DRAWN AEROBIC AND ANAEROBIC Blood Culture adequate volume   Culture   Final    NO GROWTH 5 DAYS Performed at [facility], [address] [place], [place] [address]    Report Status 01/22/2019 FINAL  Final  Blood culture (routine x 2)     Status: None   Collection Time: 01/17/19  8:57 PM   Specimen: BLOOD RIGHT HAND  Result Value Ref Range Status   Specimen Description BLOOD RIGHT HAND  Final   Special Requests   Final    BOTTLES DRAWN AEROBIC AND ANAEROBIC Blood Culture adequate volume   Culture   Final    NO GROWTH 5 DAYS Performed at [facility], [address], [place], [place] [address]    Report Status 01/22/2019 FINAL  Final  SARS CORONAVIRUS 2 Nasal Swab Aptima Multi Swab     Status: None   Collection Time:  01/18/19 12:39 AM   Specimen: Aptima Multi Swab; Nasal Swab  Result Value Ref Range Status   SARS Coronavirus 2 NEGATIVE NEGATIVE Final    Comment: (NOTE) SARS-CoV-2 target nucleic acids are NOT DETECTED. The SARS-CoV-2 RNA is generally detectable in upper and lower respiratory specimens during the acute phase of infection. Negative results do not preclude SARS-CoV-2 infection, do not rule out co-infections with other pathogens, and should not be used as the sole basis for treatment or other patient management decisions. Negative results must be combined with clinical observations, patient history, and epidemiological information. The expected result is Negative. Fact Sheet for Patients: HairSlick.no Fact Sheet for Healthcare Providers: quierodirigir.com This test is not yet approved or cleared by the [place] FDA and  has been authorized for detection and/or diagnosis of SARS-CoV-2 by FDA under an Emergency Use Authorization (EUA). This EUA will remain  in effect (meaning this test can be used) for the duration of the COVID-19 declaration under Section 56 4(b)(1) of the Act, 21 U.S.C. section 360bbb-3(b)(1), unless the authorization is terminated or revoked sooner. Performed at [facility], [address], [place], [place] [address]   MRSA PCR Screening     Status: None   Collection Time: 01/19/19  6:38 AM   Specimen: Nasal Mucosa; Nasopharyngeal  Result Value Ref Range Status   MRSA by PCR NEGATIVE NEGATIVE Final    Comment:        The GeneXpert MRSA Assay (FDA approved for NASAL specimens only), is one component of a comprehensive MRSA colonization surveillance program. It is not intended to diagnose MRSA infection nor to guide or monitor treatment for MRSA infections. Performed at [facility], [address], [place], [place] [address]   Gastrointestinal Panel by PCR , Stool     Status: None    Collection Time: 01/20/19  5:37 PM   Specimen: Stool  Result Value Ref Range Status   Campylobacter species NOT DETECTED NOT DETECTED Final   Plesimonas shigelloides NOT DETECTED NOT DETECTED Final   Salmonella species NOT DETECTED NOT DETECTED Final   Yersinia enterocolitica NOT DETECTED NOT DETECTED Final   Vibrio species NOT DETECTED NOT DETECTED Final   Vibrio cholerae NOT DETECTED NOT  DETECTED Final   Enteroaggregative E coli (EAEC) NOT DETECTED NOT DETECTED Final   Enteropathogenic E coli (EPEC) NOT DETECTED NOT DETECTED Final   Enterotoxigenic E coli (ETEC) NOT DETECTED NOT DETECTED Final   Shiga like toxin producing E coli (STEC) NOT DETECTED NOT DETECTED Final   Shigella/Enteroinvasive E coli (EIEC) NOT DETECTED NOT DETECTED Final   Cryptosporidium NOT DETECTED NOT DETECTED Final   Cyclospora cayetanensis NOT DETECTED NOT DETECTED Final   Entamoeba histolytica NOT DETECTED NOT DETECTED Final   Giardia lamblia NOT DETECTED NOT DETECTED Final   Adenovirus F40/41 NOT DETECTED NOT DETECTED Final   Astrovirus NOT DETECTED NOT DETECTED Final   Norovirus GI/GII NOT DETECTED NOT DETECTED Final   Rotavirus A NOT DETECTED NOT DETECTED Final   Sapovirus (I, II, IV, and V) NOT DETECTED NOT DETECTED Final    Comment: Performed at [facility], [address], [place], [place] [address]    Radiology Reports Mr Foot Right Wo Contrast  Result Date: 01/18/2019 CLINICAL DATA:  Diabetic patient with an open wound on the right great toe. EXAM: MRI OF THE RIGHT FOREFOOT WITHOUT CONTRAST TECHNIQUE: Multiplanar, multisequence MR imaging of the right forefoot was performed. No intravenous contrast was administered. COMPARISON:  Plain films right foot 01/17/2019. FINDINGS: Bones/Joint/Cartilage Motion degrades the examination. There is marrow edema throughout the distal phalanx of the great toe consistent with osteomyelitis. No other evidence of osteomyelitis is identified. Ligaments Intact.  Muscles and Tendons Intact. No intramuscular fluid collection. 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 Soft tissues Skin wound at the distal aspect of the great toe is identified. No abscess is seen. IMPRESSION: Motion degraded examination demonstrates marrow edema throughout the distal phalanx of the great toe consistent with osteomyelitis. No abscess is identified. No evidence of septic joint. Electronically Signed   By: Drusilla Kanner M.D.   On: 01/18/2019 07:35   US Arterial Abi (screening Lower Extremity)  Result Date: 01/18/2019 CLINICAL DATA:  Right diabetic open foot wound, great toe. Previous amputation. EXAM: NONINVASIVE PHYSIOLOGIC VASCULAR STUDY OF BILATERAL LOWER EXTREMITIES TECHNIQUE: Evaluation of both lower extremities were performed at rest, including calculation of ankle-brachial indices with single level Doppler, pressure and pulse volume recording. COMPARISON:  None. FINDINGS: Right ABI:  1.28 Left ABI:  1.16 Right Lower Extremity:  Multiphasic waveform at the ankle. Left Lower Extremity:  Multiphasic waveform at the ankle. IMPRESSION: Normal bilateral ABIs. Vascular calcification and resultant limited compressibility in the setting of diabetes can result in elevated values however. Electronically Signed   By: Corlis Leak M.D.   On: 01/18/2019 09:50   Dg Foot Complete Right  Result Date: 01/17/2019 CLINICAL DATA:  Swelling and drainage from right great toe. History of diabetes. EXAM: RIGHT FOOT COMPLETE - 3+ VIEW COMPARISON:  Radiographs 12/30/2018 FINDINGS: No acute fracture or traumatic malalignment. There is soft tissue swelling and ulceration at the tip of the first digit. No subcutaneous gas few punctate radiodensities are present within the soft tissues which are new from prior exam and could reflect debris or foreign body. No bony erosion, destructive change or immature periostitis to suggest early radiographic  features of osteomyelitis. 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 Hammertoe deformities of the third and fourth digits. May mild mid foot arthrosis is present as well. Please note alignment of the mid and hindfoot is incompletely assessed on nonweightbearing radiographs. Calcaneal spurs are present at the Achilles and plantar ligament insertions.  IMPRESSION: 1. Soft tissue swelling and ulceration at the tip of the first digit. Few punctate radiodensities within the soft tissues are new from prior exam and could reflect debris or foreign body and should be correlated with visual inspection. 2. No radiographic evidence of osteomyelitis. MRI is more sensitive and specific for early findings of osteomyelitis. Electronically Signed   By: Kreg Shropshire M.D.   On: 01/17/2019 21:41   Dg Foot Complete Right  Result Date: 12/30/2018 CLINICAL DATA:  Infection, right great toe EXAM: RIGHT FOOT COMPLETE - 3+ VIEW COMPARISON:  None. FINDINGS: No fracture or dislocation of the right foot. No bony erosion or sclerosis of the great toe to suggest osteomyelitis. There is moderate first metatarsophalangeal arthrosis. Mild midfoot arthrosis. The remaining joint spaces are well preserved. Mild soft tissue edema of the great toe. IMPRESSION: No fracture or dislocation of the right foot. No bony erosion or sclerosis of the great toe to suggest osteomyelitis. There is moderate first metatarsophalangeal arthrosis. Mild midfoot arthrosis. The remaining joint spaces are well preserved. Mild soft tissue edema of the great toe. MRI may be used to more sensitively evaluate for bone marrow edema and osteomyelitis if suspected. Electronically Signed   By: Lauralyn Primes M.D.   On: 12/30/2018 13:10    CBC Recent Labs  Lab 01/20/19 0604 01/21/19 0611 01/23/19 0456 01/24/19 0442 01/25/19 0517 01/26/19 0509  WBC 10.0 9.3 10.0 9.3 10.4 12.0*  HGB 11.0* 11.8* 12.0* 12.1* 12.3* 11.4*  HCT 35.0* 37.2* 36.3* 36.2*  37.5* 34.6*  PLT 345 350 349 348 350 300  MCV 90.7 91.6 89.4 88.5 89.9 89.6  MCH 28.5 29.1 29.6 29.6 29.5 29.5  MCHC 31.4 31.7 33.1 33.4 32.8 32.9  RDW 13.0 12.9 12.7 12.7 13.0 13.1  LYMPHSABS 1.8 1.8  --   --   --   --   MONOABS 1.3* 1.3*  --   --   --   --   EOSABS 0.5 0.6*  --   --   --   --   BASOSABS 0.1 0.1  --   --   --   --     Chemistries  Recent Labs  Lab 01/22/19 0520 01/23/19 0456 01/24/19 0442 01/25/19 0517 01/26/19 0509  NA 138 138 137 138 136  K 4.2 4.3 3.9 4.3 4.2  CL 106 102 104 104 103  CO2 24 25 23 24 24   GLUCOSE 129* 111* 139* 127* 112*  BUN 32* 37* 37* 34* 32*  CREATININE 1.07 1.32* 1.27* 1.22 1.16  CALCIUM 8.9 9.0 8.9 9.0 8.7*     Lyna Laningham C Joanmarie Tsang DO on 01/26/2019 at 8:16 AM

## 2019-01-26 NOTE — Plan of Care (Signed)
  Problem: Elimination: Goal: Will not experience complications related to bowel motility Outcome: Progressing Goal: Will not experience complications related to urinary retention Outcome: Progressing   Problem: Pain Managment: Goal: General experience of comfort will improve Outcome: Progressing   Problem: Skin Integrity: Goal: Risk for impaired skin integrity will decrease Outcome: Progressing   

## 2019-01-26 NOTE — Progress Notes (Signed)
DOrthopedic Tech Progress Note Patient Details:  Wesley Ingram Nov 04, 1945 [id number]  Patient ID: Wesley Ingram, male   DOB: May 16, 1946, 73 y.o.   MRN: [id number]   Wesley Ingram 01/26/2019, 3:24 PMDarco shoe

## 2019-01-26 NOTE — Progress Notes (Addendum)
  Progress Note    01/26/2019 10:34 AM 1 Day Post-Op  Subjective:  Minimal pain   Vitals:   01/25/19 2129 01/26/19 0415  BP: (!) 110/59 122/64  Pulse: 68 72  Resp: 16 18  Temp: 98.3 F (36.8 C) 98.6 F (37 C)  SpO2: 95% 97%   Physical Exam: Lungs:  Non labored Incisions:  R GT incision c/d/i, viable skin edges Extremities:  Palpable R DP pulse Neurologic: A&O  CBC    Component Value Date/Time   WBC 12.0 (H) 01/26/2019 0509   RBC 3.86 (L) 01/26/2019 0509   HGB 11.4 (L) 01/26/2019 0509   HCT 34.6 (L) 01/26/2019 0509   PLT 300 01/26/2019 0509   MCV 89.6 01/26/2019 0509   MCH 29.5 01/26/2019 0509   MCHC 32.9 01/26/2019 0509   RDW 13.1 01/26/2019 0509   LYMPHSABS 1.8 01/21/2019 0611   MONOABS 1.3 (H) 01/21/2019 0611   EOSABS 0.6 (H) 01/21/2019 0611   BASOSABS 0.1 01/21/2019 0611    BMET    Component Value Date/Time   NA 136 01/26/2019 0509   K 4.2 01/26/2019 0509   CL 103 01/26/2019 0509   CO2 24 01/26/2019 0509   GLUCOSE 112 (H) 01/26/2019 0509   BUN 32 (H) 01/26/2019 0509   CREATININE 1.16 01/26/2019 0509   CALCIUM 8.7 (L) 01/26/2019 0509   GFRNONAA >60 01/26/2019 0509   GFRAA >60 01/26/2019 0509    INR    Component Value Date/Time   INR 1.4 (H) 01/25/2019 0517     Intake/Output Summary (Last 24 hours) at 01/26/2019 1034 Last data filed at 01/26/2019 0942 Gross per 24 hour  Intake 1551.4 ml  Output 1711 ml  Net -159.6 ml     Assessment/Plan:  73 y.o. male is s/p R GT amp 1 Day Post-Op   Amp site unremarkable Heel weightbearing only; shoe ordered Ok for discharge from vascular standpoint Follow up for wound check in 2-3 weeks   Dagoberto Ligas, PA-C [facility] [phone] 01/26/2019 10:34 AM  I agree with the above.  OK for d/c.  He will follow up in 2-3 weeks  Annamarie Major

## 2019-01-27 LAB — GLUCOSE, CAPILLARY
Glucose-Capillary: 230 mg/dL — ABNORMAL HIGH (ref 70–99)
Glucose-Capillary: 235 mg/dL — ABNORMAL HIGH (ref 70–99)
Glucose-Capillary: 144 mg/dL — ABNORMAL HIGH (ref 70–99)
Glucose-Capillary: 197 mg/dL — ABNORMAL HIGH (ref 70–99)

## 2019-01-27 LAB — CBC
HCT: 35 % — ABNORMAL LOW (ref 39.0–52.0)
Hemoglobin: 11.4 g/dL — ABNORMAL LOW (ref 13.0–17.0)
MCH: 29.2 pg (ref 26.0–34.0)
MCHC: 32.6 g/dL (ref 30.0–36.0)
MCV: 89.5 fL (ref 80.0–100.0)
Platelets: 300 10*3/uL (ref 150–400)
RBC: 3.91 MIL/uL — ABNORMAL LOW (ref 4.22–5.81)
RDW: 13.2 % (ref 11.5–15.5)
WBC: 9.8 10*3/uL (ref 4.0–10.5)
nRBC: 0 % (ref 0.0–0.2)

## 2019-01-27 LAB — BASIC METABOLIC PANEL
Anion gap: 9 (ref 5–15)
BUN: 34 mg/dL — ABNORMAL HIGH (ref 8–23)
CO2: 24 mmol/L (ref 22–32)
Calcium: 8.7 mg/dL — ABNORMAL LOW (ref 8.9–10.3)
Chloride: 106 mmol/L (ref 98–111)
Creatinine, Ser: 1.16 mg/dL (ref 0.61–1.24)
GFR calc Af Amer: >60 mL/min (ref >60)
GFR calc non Af Amer: >60 mL/min (ref >60)
Glucose, Bld: 120 mg/dL — ABNORMAL HIGH (ref 70–99)
Potassium: 4.2 mmol/L (ref 3.5–5.1)
Sodium: 139 mmol/L (ref 135–145)

## 2019-01-27 LAB — SARS CORONAVIRUS 2 (TAT 6-24 HRS): SARS Coronavirus 2: NEGATIVE

## 2019-01-27 MED ORDER — METFORMIN HCL 500 MG PO TABS
1000.0000 mg | ORAL_TABLET | Freq: Two times a day (BID) | ORAL | Status: DC
  Administered 2019-01-27 – 2019-01-29 (×4): 1000 mg via ORAL
  Filled 2019-01-27 (×4): qty 2

## 2019-01-27 MED ORDER — APIXABAN 5 MG PO TABS
5.0000 mg | ORAL_TABLET | Freq: Two times a day (BID) | ORAL | Status: DC
  Administered 2019-01-27 – 2019-01-29 (×5): 5 mg via ORAL
  Filled 2019-01-27 (×5): qty 1

## 2019-01-27 NOTE — Progress Notes (Signed)
Patient Demographics:    Wesley Ingram, is a 73 y.o. male, DOB - 1946-06-05, PJA:[id number]  Admit date - 01/17/2019   Admitting Physician Reubin Milan, MD  Outpatient Primary MD for the patient is [place]  LOS - 10   Chief Complaint  Patient presents with  . Foot Pain      Brief summary Wesley Ingram is a 73 y.o. male with medical history significant of type 2 diabetes, hypertriglyceridemia, hypertension, renal disorder, history of CVA with residual left-sided hemiparesis who is coming to the emergency department for evaluation of his left big toe that after he was seen earlier by his home health nurse.  He does not know how he acquired this injury, but states that he has had it for at least 2 weeks.  He denies fever and night sweats, but feels chills and fatigue.  No headache, rhinorrhea, sore throat, wheezing, hemoptysis or dyspnea.  Denies chest pain, palpitations, dizziness, diaphoresis, PND, orthopnea or pitting edema of the lower extremities.  Denies abdominal pain, diarrhea, constipation, melena or hematochezia.  No dysuria, frequency or hematuria.  Denies blurred vision, polyuria, polydipsia, polyphagia. In ED initial vital signs were temperature 98.3 F, pulse 67, respiration 18, blood pressure 125/74 mmHg and O2 sat 97% on room air.  He was given vancomycin in the emergency department. Patient transfered from [facility] to [facility] on 01/22/2019 for further vascular surgery intervention.Discussed with Dr. Shary Key to do an arteriogram on 01/25/2019  - follow up with podiatry/ortho thereafter pending this evaluation.    Subjective:   No acute issues/events overnight, denies fever, chills, shortness of breath, headaches, nausea, vomiting, diarrhea, constipation.   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 twice daily -consider increasing to 5 twice daily pending overnight glucose levels -Resume home metformin -Continue sliding scale insulin, hypoglycemic protocol, diabetic diet    HTN (hypertension) -Stable, -Continue Amlodipine 2.5 and metoprolol succinate 12.5 -Hold losartan as above to avoid nephrotoxicity -Monitor BP, HR, renal function electrolytes.  Hyperlipidemia -Continue atorvastatin 20 mg p.o. daily.  Chronic Anemia, likely anemia of chronic disease, stable -Follow H&H with daily labs  History of prior CVA/chronic anticoagulation History of DVT -Resume Eliquis  Diarrhea - Appears to be somewhat chronic, follow cultures; following with GI in the outpatient setting  DVT prophylaxis:  Resume Eliquis Code Status: Full code Disposition Plan:  Likely SNF in the next 24 to 48 hours pending clinical course, physical therapy recommendations, insurance approval and bed mobility.  Consults: Vascular surgery  Inpatient Medications  Patient Demographics:    Wesley Ingram, is a 73 y.o. male, DOB - 1945/07/25, [id number]  Admit date - 01/17/2019   Admitting Physician Reubin Milan, MD  Outpatient Primary MD for the patient is [place]  LOS - 10   Chief Complaint  Patient presents with  . Foot Pain      Brief summary Wesley Ingram is a 74 y.o. male with medical history significant of type 2 diabetes, hypertriglyceridemia, hypertension, renal disorder, history of CVA with residual left-sided hemiparesis who is coming to the emergency department for evaluation of his left big toe that after he was seen earlier by his home health nurse.  He does not know how he acquired this injury, but states that he has had it for at least 2 weeks.  He denies fever and night sweats, but feels chills and fatigue.  No headache, rhinorrhea, sore throat, wheezing, hemoptysis or dyspnea.  Denies chest pain, palpitations, dizziness, diaphoresis, PND, orthopnea or pitting edema of the lower extremities.  Denies abdominal pain, diarrhea, constipation, melena or hematochezia.  No dysuria, frequency or hematuria.  Denies blurred vision, polyuria, polydipsia, polyphagia. In ED initial vital signs were temperature 98.3 F, pulse 67, respiration 18, blood pressure 125/74 mmHg and O2 sat 97% on room air.  He was given vancomycin in the emergency department. Patient transfered from [facility] to [facility] on 01/22/2019 for further vascular surgery intervention.Discussed with Dr. Shary Key to do an arteriogram on 01/25/2019  - follow up with podiatry/ortho thereafter pending this evaluation.    Subjective:   No acute issues/events overnight, denies fever, chills, shortness of breath, headaches, nausea, vomiting, diarrhea, constipation.   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 twice daily -consider increasing to 5 twice daily pending overnight glucose levels -Resume home metformin -Continue sliding scale insulin, hypoglycemic protocol, diabetic diet    HTN (hypertension) -Stable, -Continue Amlodipine 2.5 and metoprolol succinate 12.5 -Hold losartan as above to avoid nephrotoxicity -Monitor BP, HR, renal function electrolytes.  Hyperlipidemia -Continue atorvastatin 20 mg p.o. daily.  Chronic Anemia, likely anemia of chronic disease, stable -Follow H&H with daily labs  History of prior CVA/chronic anticoagulation History of DVT -Resume Eliquis  Diarrhea - Appears to be somewhat chronic, follow cultures; following with GI in the outpatient setting  DVT prophylaxis:  Resume Eliquis Code Status: Full code Disposition Plan:  Likely SNF in the next 24 to 48 hours pending clinical course, physical therapy recommendations, insurance approval and bed mobility.  Consults: Vascular surgery  Inpatient Medications  Patient Demographics:    Wesley Ingram, is a 73 y.o. male, DOB - 1945/07/25, [id number]  Admit date - 01/17/2019   Admitting Physician Reubin Milan, MD  Outpatient Primary MD for the patient is [place]  LOS - 10   Chief Complaint  Patient presents with  . Foot Pain      Brief summary Wesley Ingram is a 74 y.o. male with medical history significant of type 2 diabetes, hypertriglyceridemia, hypertension, renal disorder, history of CVA with residual left-sided hemiparesis who is coming to the emergency department for evaluation of his left big toe that after he was seen earlier by his home health nurse.  He does not know how he acquired this injury, but states that he has had it for at least 2 weeks.  He denies fever and night sweats, but feels chills and fatigue.  No headache, rhinorrhea, sore throat, wheezing, hemoptysis or dyspnea.  Denies chest pain, palpitations, dizziness, diaphoresis, PND, orthopnea or pitting edema of the lower extremities.  Denies abdominal pain, diarrhea, constipation, melena or hematochezia.  No dysuria, frequency or hematuria.  Denies blurred vision, polyuria, polydipsia, polyphagia. In ED initial vital signs were temperature 98.3 F, pulse 67, respiration 18, blood pressure 125/74 mmHg and O2 sat 97% on room air.  He was given vancomycin in the emergency department. Patient transfered from [facility] to [facility] on 01/22/2019 for further vascular surgery intervention.Discussed with Dr. Shary Key to do an arteriogram on 01/25/2019  - follow up with podiatry/ortho thereafter pending this evaluation.    Subjective:   No acute issues/events overnight, denies fever, chills, shortness of breath, headaches, nausea, vomiting, diarrhea, constipation.   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 twice daily -consider increasing to 5 twice daily pending overnight glucose levels -Resume home metformin -Continue sliding scale insulin, hypoglycemic protocol, diabetic diet    HTN (hypertension) -Stable, -Continue Amlodipine 2.5 and metoprolol succinate 12.5 -Hold losartan as above to avoid nephrotoxicity -Monitor BP, HR, renal function electrolytes.  Hyperlipidemia -Continue atorvastatin 20 mg p.o. daily.  Chronic Anemia, likely anemia of chronic disease, stable -Follow H&H with daily labs  History of prior CVA/chronic anticoagulation History of DVT -Resume Eliquis  Diarrhea - Appears to be somewhat chronic, follow cultures; following with GI in the outpatient setting  DVT prophylaxis:  Resume Eliquis Code Status: Full code Disposition Plan:  Likely SNF in the next 24 to 48 hours pending clinical course, physical therapy recommendations, insurance approval and bed mobility.  Consults: Vascular surgery  Inpatient Medications  Patient Demographics:    Wesley Ingram, is a 73 y.o. male, DOB - 1946-06-05, [id number]  Admit date - 01/17/2019   Admitting Physician Reubin Milan, MD  Outpatient Primary MD for the patient is [place]  LOS - 10   Chief Complaint  Patient presents with  . Foot Pain      Brief summary Wesley Ingram is a 73 y.o. male with medical history significant of type 2 diabetes, hypertriglyceridemia, hypertension, renal disorder, history of CVA with residual left-sided hemiparesis who is coming to the emergency department for evaluation of his left big toe that after he was seen earlier by his home health nurse.  He does not know how he acquired this injury, but states that he has had it for at least 2 weeks.  He denies fever and night sweats, but feels chills and fatigue.  No headache, rhinorrhea, sore throat, wheezing, hemoptysis or dyspnea.  Denies chest pain, palpitations, dizziness, diaphoresis, PND, orthopnea or pitting edema of the lower extremities.  Denies abdominal pain, diarrhea, constipation, melena or hematochezia.  No dysuria, frequency or hematuria.  Denies blurred vision, polyuria, polydipsia, polyphagia. In ED initial vital signs were temperature 98.3 F, pulse 67, respiration 18, blood pressure 125/74 mmHg and O2 sat 97% on room air.  He was given vancomycin in the emergency department. Patient transfered from [facility] to [facility] on 01/22/2019 for further vascular surgery intervention.Discussed with Dr. Shary Key to do an arteriogram on 01/25/2019  - follow up with podiatry/ortho thereafter pending this evaluation.    Subjective:   No acute issues/events overnight, denies fever, chills, shortness of breath, headaches, nausea, vomiting, diarrhea, constipation.   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 twice daily -consider increasing to 5 twice daily pending overnight glucose levels -Resume home metformin -Continue sliding scale insulin, hypoglycemic protocol, diabetic diet    HTN (hypertension) -Stable, -Continue Amlodipine 2.5 and metoprolol succinate 12.5 -Hold losartan as above to avoid nephrotoxicity -Monitor BP, HR, renal function electrolytes.  Hyperlipidemia -Continue atorvastatin 20 mg p.o. daily.  Chronic Anemia, likely anemia of chronic disease, stable -Follow H&H with daily labs  History of prior CVA/chronic anticoagulation History of DVT -Resume Eliquis  Diarrhea - Appears to be somewhat chronic, follow cultures; following with GI in the outpatient setting  DVT prophylaxis:  Resume Eliquis Code Status: Full code Disposition Plan:  Likely SNF in the next 24 to 48 hours pending clinical course, physical therapy recommendations, insurance approval and bed mobility.  Consults: Vascular surgery  Inpatient Medications  Patient Demographics:    Wesley Ingram, is a 73 y.o. male, DOB - 1945/07/25, PJA:[id number]  Admit date - 01/17/2019   Admitting Physician Reubin Milan, MD  Outpatient Primary MD for the patient is [place]  LOS - 10   Chief Complaint  Patient presents with  . Foot Pain      Brief summary Wesley Ingram is a 74 y.o. male with medical history significant of type 2 diabetes, hypertriglyceridemia, hypertension, renal disorder, history of CVA with residual left-sided hemiparesis who is coming to the emergency department for evaluation of his left big toe that after he was seen earlier by his home health nurse.  He does not know how he acquired this injury, but states that he has had it for at least 2 weeks.  He denies fever and night sweats, but feels chills and fatigue.  No headache, rhinorrhea, sore throat, wheezing, hemoptysis or dyspnea.  Denies chest pain, palpitations, dizziness, diaphoresis, PND, orthopnea or pitting edema of the lower extremities.  Denies abdominal pain, diarrhea, constipation, melena or hematochezia.  No dysuria, frequency or hematuria.  Denies blurred vision, polyuria, polydipsia, polyphagia. In ED initial vital signs were temperature 98.3 F, pulse 67, respiration 18, blood pressure 125/74 mmHg and O2 sat 97% on room air.  He was given vancomycin in the emergency department. Patient transfered from [facility] to [facility] on 01/22/2019 for further vascular surgery intervention.Discussed with Dr. Shary Key to do an arteriogram on 01/25/2019  - follow up with podiatry/ortho thereafter pending this evaluation.    Subjective:   No acute issues/events overnight, denies fever, chills, shortness of breath, headaches, nausea, vomiting, diarrhea, constipation.   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 twice daily -consider increasing to 5 twice daily pending overnight glucose levels -Resume home metformin -Continue sliding scale insulin, hypoglycemic protocol, diabetic diet    HTN (hypertension) -Stable, -Continue Amlodipine 2.5 and metoprolol succinate 12.5 -Hold losartan as above to avoid nephrotoxicity -Monitor BP, HR, renal function electrolytes.  Hyperlipidemia -Continue atorvastatin 20 mg p.o. daily.  Chronic Anemia, likely anemia of chronic disease, stable -Follow H&H with daily labs  History of prior CVA/chronic anticoagulation History of DVT -Resume Eliquis  Diarrhea - Appears to be somewhat chronic, follow cultures; following with GI in the outpatient setting  DVT prophylaxis:  Resume Eliquis Code Status: Full code Disposition Plan:  Likely SNF in the next 24 to 48 hours pending clinical course, physical therapy recommendations, insurance approval and bed mobility.  Consults: Vascular surgery  Inpatient Medications

## 2019-01-28 LAB — BASIC METABOLIC PANEL
Anion gap: 8 (ref 5–15)
BUN: 34 mg/dL — ABNORMAL HIGH (ref 8–23)
CO2: 25 mmol/L (ref 22–32)
Calcium: 8.7 mg/dL — ABNORMAL LOW (ref 8.9–10.3)
Chloride: 105 mmol/L (ref 98–111)
Creatinine, Ser: 1.06 mg/dL (ref 0.61–1.24)
GFR calc Af Amer: >60 mL/min (ref >60)
GFR calc non Af Amer: >60 mL/min (ref >60)
Glucose, Bld: 123 mg/dL — ABNORMAL HIGH (ref 70–99)
Potassium: 4 mmol/L (ref 3.5–5.1)
Sodium: 138 mmol/L (ref 135–145)

## 2019-01-28 LAB — CBC
HCT: 34.6 % — ABNORMAL LOW (ref 39.0–52.0)
Hemoglobin: 11.4 g/dL — ABNORMAL LOW (ref 13.0–17.0)
MCH: 29.6 pg (ref 26.0–34.0)
MCHC: 32.9 g/dL (ref 30.0–36.0)
MCV: 89.9 fL (ref 80.0–100.0)
Platelets: 283 K/uL (ref 150–400)
RBC: 3.85 MIL/uL — ABNORMAL LOW (ref 4.22–5.81)
RDW: 13.3 % (ref 11.5–15.5)
WBC: 10.8 K/uL — ABNORMAL HIGH (ref 4.0–10.5)
nRBC: 0 % (ref 0.0–0.2)

## 2019-01-28 LAB — GLUCOSE, CAPILLARY
Glucose-Capillary: 121 mg/dL — ABNORMAL HIGH (ref 70–99)
Glucose-Capillary: 147 mg/dL — ABNORMAL HIGH (ref 70–99)
Glucose-Capillary: 100 mg/dL — ABNORMAL HIGH (ref 70–99)
Glucose-Capillary: 138 mg/dL — ABNORMAL HIGH (ref 70–99)

## 2019-01-28 NOTE — Progress Notes (Signed)
Patient Demographics:    Wesley Ingram, is a 73 y.o. male, DOB - 1945/10/26, ZOX:[id number]  Admit date - 01/17/2019   Admitting Physician Bobette Mo, MD  Outpatient Primary MD for the patient is [facility]  LOS - 11   Chief Complaint  Patient presents with   Foot Pain      Brief summary Wesley Ingram is a 73 y.o. male with medical history significant of type 2 diabetes, hypertriglyceridemia, hypertension, renal disorder, history of CVA with residual left-sided hemiparesis who is coming to the emergency department for evaluation of his left big toe that after he was seen earlier by his home health nurse.  He does not know how he acquired this injury, but states that he has had it for at least 2 weeks.  He denies fever and night sweats, but feels chills and fatigue.  No headache, rhinorrhea, sore throat, wheezing, hemoptysis or dyspnea.  Denies chest pain, palpitations, dizziness, diaphoresis, PND, orthopnea or pitting edema of the lower extremities.  Denies abdominal pain, diarrhea, constipation, melena or hematochezia.  No dysuria, frequency or hematuria.  Denies blurred vision, polyuria, polydipsia, polyphagia. In ED initial vital signs were temperature 98.3 F, pulse 67, respiration 18, blood pressure 125/74 mmHg and O2 sat 97% on room air.  He was given vancomycin in the emergency department. Patient transfered from [facility] to [facility] on 01/22/2019 for further vascular surgery intervention.Discussed with Dr. Seward Meth to do an arteriogram on 01/25/2019  - follow up with podiatry/ortho thereafter pending this evaluation.    Subjective:   No acute issues/events overnight, denies fever, chills, shortness of breath, headaches, nausea, vomiting, diarrhea, constipation.   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 twice daily -consider increasing to 5 twice daily pending overnight glucose levels -Resume home metformin -Continue sliding scale insulin, hypoglycemic protocol, diabetic diet    HTN (hypertension) -Stable, -Continue Amlodipine 2.5 and metoprolol succinate 12.5 -Hold losartan as above to avoid nephrotoxicity -Monitor BP, HR, renal function electrolytes.  Hyperlipidemia -Continue atorvastatin 20 mg p.o. daily.  Chronic Anemia, likely anemia of chronic disease, stable -Follow H&H with daily labs  History of prior CVA/chronic anticoagulation History of DVT -Resume Eliquis  Diarrhea - Appears to be somewhat chronic, follow cultures; following with GI in the outpatient setting  DVT prophylaxis:  Resume Eliquis Code Status: Full code Disposition Plan:  Likely SNF in the next 24hours pending clinical course, physical therapy recommendations, insurance approval and bed mobility. Covid negative 01/28/19. Consults: Vascular surgery  Inpatient Medications  Scheduled  Meds:  amLODipine  2.5 mg Oral Daily   apixaban  5 mg Oral BID   atorvastatin  20 mg Oral q morning - 10a   feeding supplement (PRO-STAT SUGAR FREE 64)  30 mL Oral BID   glipiZIDE  2.5 mg Oral BID AC   insulin aspart  0-15 Units  Subcutaneous TID WC   metFORMIN  1,000 mg Oral BID WC   metoprolol succinate  12.5 mg Oral Daily   multivitamin with minerals  1 tablet Oral Daily   nutrition supplement (JUVEN)  1 packet Oral BID BM   Continuous Infusions:  sodium chloride Stopped (01/23/19 0617)   lactated ringers Stopped (01/25/19 1500)   PRN Meds:.sodium chloride, acetaminophen **OR** acetaminophen, loperamide, Melatonin, polyvinyl alcohol   Anti-infectives (From admission, onward)   Start     Dose/Rate Route Frequency Ordered Stop   01/25/19 1500  vancomycin (VANCOCIN) IVPB 750 mg/150 ml premix  Status:  Discontinued     750 mg 150 mL/hr over 60 Minutes Intravenous Every 12 hours 01/25/19 1355 01/26/19 1525   01/18/19 1000  vancomycin (VANCOCIN) IVPB 750 mg/150 ml premix  Status:  Discontinued     750 mg 150 mL/hr over 60 Minutes Intravenous Every 12 hours 01/18/19 0847 01/23/19 1348   01/18/19 0800  vancomycin (VANCOCIN) IVPB 750 mg/150 ml premix  Status:  Discontinued     750 mg 150 mL/hr over 60 Minutes Intravenous Every 12 hours 01/17/19 2235 01/18/19 0847   01/17/19 2230  cefTRIAXone (ROCEPHIN) 2 g in sodium chloride 0.9 % 100 mL IVPB  Status:  Discontinued     2 g 200 mL/hr over 30 Minutes Intravenous Every 24 hours 01/17/19 2224 01/23/19 1348   01/17/19 2230  metroNIDAZOLE (FLAGYL) tablet 500 mg  Status:  Discontinued     500 mg Oral Every 8 hours 01/17/19 2224 01/23/19 1348   01/17/19 2215  vancomycin (VANCOCIN) IVPB 1000 mg/200 mL premix     1,000 mg 200 mL/hr over 60 Minutes Intravenous  Once 01/17/19 2208 01/17/19 2340        Objective:   Vitals:   01/27/19 0833 01/27/19 1311 01/27/19 2024 01/28/19 0453  BP: 130/66 123/60 (!) 122/59 126/73  Pulse: 62 67 70 65  Resp:  20 18 18   Temp:  98 F (36.7 C) 98.2 F (36.8 C) 97.7 F (36.5 C)  TempSrc:  Oral Oral Oral  SpO2:  97% 96% 97%  Weight:      Height:        Wt Readings from Last 3 Encounters:  01/25/19 73.3 kg  01/16/19 72.6 kg   12/30/18 74.8 kg    Intake/Output Summary (Last 24 hours) at 01/28/2019 0830 Last data filed at 01/27/2019 2229 Gross per 24 hour  Intake --  Output 1600 ml  Net -1600 ml   Physical Exam Gen:- Awake Alert,  In no apparent distress  HEENT:- [place].AT, No sclera icterus Neck-Supple Neck,No JVD,.  Lungs-  CTAB , fair symmetrical air movement CV- S1, S2 normal, regular  Abd-  +ve B.Sounds, Abd Soft, No tenderness,    Extremity--right foot bandage clean dry intact Psych-affect is appropriate, oriented x3 Neuro-Chronic Lt sided Hemiparesis, but no new focal deficits, no tremors    Data Review:   Micro Results Recent Results (from the past 240 hour(s))  MRSA PCR Screening     Status: None   Collection Time: 01/19/19  6:38 AM   Specimen: Nasal Mucosa; Nasopharyngeal  Result Value Ref Range Status   MRSA  by PCR NEGATIVE NEGATIVE Final    Comment:        The GeneXpert MRSA Assay (FDA approved for NASAL specimens only), is one component of a comprehensive MRSA colonization surveillance program. It is not intended to diagnose MRSA infection nor to guide or monitor treatment for MRSA infections. Performed at [facility], [address], [place], [place] [address]   Gastrointestinal Panel by PCR , Stool     Status: None   Collection Time: 01/20/19  5:37 PM   Specimen: Stool  Result Value Ref Range Status   Campylobacter species NOT DETECTED NOT DETECTED Final   Plesimonas shigelloides NOT DETECTED NOT DETECTED Final   Salmonella species NOT DETECTED NOT DETECTED Final   Yersinia enterocolitica NOT DETECTED NOT DETECTED Final   Vibrio species NOT DETECTED NOT DETECTED Final   Vibrio cholerae NOT DETECTED NOT DETECTED Final   Enteroaggregative E coli (EAEC) NOT DETECTED NOT DETECTED Final   Enteropathogenic E coli (EPEC) NOT DETECTED NOT DETECTED Final   Enterotoxigenic E coli (ETEC) NOT DETECTED NOT DETECTED Final   Shiga like toxin producing E coli (STEC) NOT DETECTED NOT  DETECTED Final   Shigella/Enteroinvasive E coli (EIEC) NOT DETECTED NOT DETECTED Final   Cryptosporidium NOT DETECTED NOT DETECTED Final   Cyclospora cayetanensis NOT DETECTED NOT DETECTED Final   Entamoeba histolytica NOT DETECTED NOT DETECTED Final   Giardia lamblia NOT DETECTED NOT DETECTED Final   Adenovirus F40/41 NOT DETECTED NOT DETECTED Final   Astrovirus NOT DETECTED NOT DETECTED Final   Norovirus GI/GII NOT DETECTED NOT DETECTED Final   Rotavirus A NOT DETECTED NOT DETECTED Final   Sapovirus (I, II, IV, and V) NOT DETECTED NOT DETECTED Final    Comment: Performed at [facility], [address] [place], [place] [address]  SARS CORONAVIRUS 2 Nasal Swab Aptima Multi Swab     Status: None   Collection Time: 01/27/19  9:00 AM   Specimen: Aptima Multi Swab; Nasal Swab  Result Value Ref Range Status   SARS Coronavirus 2 NEGATIVE NEGATIVE Final    Comment: (NOTE) SARS-CoV-2 target nucleic acids are NOT DETECTED. The SARS-CoV-2 RNA is generally detectable in upper and lower respiratory specimens during the acute phase of infection. Negative results do not preclude SARS-CoV-2 infection, do not rule out co-infections with other pathogens, and should not be used as the sole basis for treatment or other patient management decisions. Negative results must be combined with clinical observations, patient history, and epidemiological information. The expected result is Negative. Fact Sheet for Patients: HairSlick.no Fact Sheet for Healthcare Providers: quierodirigir.com This test is not yet approved or cleared by the [place] FDA and  has been authorized for detection and/or diagnosis of SARS-CoV-2 by FDA under an Emergency Use Authorization (EUA). This EUA will remain  in effect (meaning this test can be used) for the duration of the COVID-19 declaration under Section 56 4(b)(1) of the Act, 21 U.S.C. section  360bbb-3(b)(1), unless the authorization is terminated or revoked sooner. Performed at [facility], [address], [place], [place] [address]     Radiology Reports Mr Foot Right Wo Contrast  Result Date: 01/18/2019 CLINICAL DATA:  Diabetic patient with an open wound on the right great toe. EXAM: MRI OF THE RIGHT FOREFOOT WITHOUT CONTRAST TECHNIQUE: Multiplanar, multisequence MR imaging of the right forefoot was performed. No intravenous contrast was administered. COMPARISON:  Plain films right foot 01/17/2019. FINDINGS: Bones/Joint/Cartilage Motion degrades the examination. There is marrow edema throughout the  distal phalanx of the great toe consistent with osteomyelitis. No other evidence of osteomyelitis is identified. Ligaments Intact. Muscles and Tendons Intact. No intramuscular fluid collection. 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 Soft tissues Skin wound at the distal aspect of the great toe is identified. No abscess is seen. IMPRESSION: Motion degraded examination demonstrates marrow edema throughout the distal phalanx of the great toe consistent with osteomyelitis. No abscess is identified. No evidence of septic joint. Electronically Signed   By: Drusilla Kanner M.D.   On: 01/18/2019 07:35   US Arterial Abi (screening Lower Extremity)  Result Date: 01/18/2019 CLINICAL DATA:  Right diabetic open foot wound, great toe. Previous amputation. EXAM: NONINVASIVE PHYSIOLOGIC VASCULAR STUDY OF BILATERAL LOWER EXTREMITIES TECHNIQUE: Evaluation of both lower extremities were performed at rest, including calculation of ankle-brachial indices with single level Doppler, pressure and pulse volume recording. COMPARISON:  None. FINDINGS: Right ABI:  1.28 Left ABI:  1.16 Right Lower Extremity:  Multiphasic waveform at the ankle. Left Lower Extremity:  Multiphasic waveform at the ankle. IMPRESSION: Normal bilateral ABIs. Vascular  calcification and resultant limited compressibility in the setting of diabetes can result in elevated values however. Electronically Signed   By: Corlis Leak M.D.   On: 01/18/2019 09:50   Dg Foot Complete Right  Result Date: 01/17/2019 CLINICAL DATA:  Swelling and drainage from right great toe. History of diabetes. EXAM: RIGHT FOOT COMPLETE - 3+ VIEW COMPARISON:  Radiographs 12/30/2018 FINDINGS: No acute fracture or traumatic malalignment. There is soft tissue swelling and ulceration at the tip of the first digit. No subcutaneous gas few punctate radiodensities are present within the soft tissues which are new from prior exam and could reflect debris or foreign body. No bony erosion, destructive change or immature periostitis to suggest early radiographic features of osteomyelitis. 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 Hammertoe deformities of the third and fourth digits. May mild mid foot arthrosis is present as well. Please note alignment of the mid and hindfoot is incompletely assessed on nonweightbearing radiographs. Calcaneal spurs are present at the Achilles and plantar ligament insertions. IMPRESSION: 1. Soft tissue swelling and ulceration at the tip of the first digit. Few punctate radiodensities within the soft tissues are new from prior exam and could reflect debris or foreign body and should be correlated with visual inspection. 2. No radiographic evidence of osteomyelitis. MRI is more sensitive and specific for early findings of osteomyelitis. Electronically Signed   By: Kreg Shropshire M.D.   On: 01/17/2019 21:41   Dg Foot Complete Right  Result Date: 12/30/2018 CLINICAL DATA:  Infection, right great toe EXAM: RIGHT FOOT COMPLETE - 3+ VIEW COMPARISON:  None. FINDINGS: No fracture or dislocation of the right foot. No bony erosion or sclerosis of the great toe to suggest osteomyelitis. There is moderate first metatarsophalangeal arthrosis. Mild midfoot arthrosis. The  remaining joint spaces are well preserved. Mild soft tissue edema of the great toe. IMPRESSION: No fracture or dislocation of the right foot. No bony erosion or sclerosis of the great toe to suggest osteomyelitis. There is moderate first metatarsophalangeal arthrosis. Mild midfoot arthrosis. The remaining joint spaces are well preserved. Mild soft tissue edema of the great toe. MRI may be used to more sensitively evaluate for bone marrow edema and osteomyelitis if suspected. Electronically Signed   By: Lauralyn Primes M.D.   On: 12/30/2018 13:10    CBC Recent Labs  Lab 01/24/19 0442 01/25/19 0517  01/26/19 0509 01/27/19 0347 01/28/19 0317  WBC 9.3 10.4 12.0* 9.8 10.8*  HGB 12.1* 12.3* 11.4* 11.4* 11.4*  HCT 36.2* 37.5* 34.6* 35.0* 34.6*  PLT 348 350 300 300 283  MCV 88.5 89.9 89.6 89.5 89.9  MCH 29.6 29.5 29.5 29.2 29.6  MCHC 33.4 32.8 32.9 32.6 32.9  RDW 12.7 13.0 13.1 13.2 13.3    Chemistries  Recent Labs  Lab 01/24/19 0442 01/25/19 0517 01/26/19 0509 01/27/19 0347 01/28/19 0317  NA 137 138 136 139 138  K 3.9 4.3 4.2 4.2 4.0  CL 104 104 103 106 105  CO2 23 24 24 24 25   GLUCOSE 139* 127* 112* 120* 123*  BUN 37* 34* 32* 34* 34*  CREATININE 1.27* 1.22 1.16 1.16 1.06  CALCIUM 8.9 9.0 8.7* 8.7* 8.7*     Merissa Renwick C Franciszek Platten DO on 01/28/2019 at 8:30 AM

## 2019-01-29 ENCOUNTER — Inpatient Hospital Stay
Admission: RE | Admit: 2019-01-29 | Discharge: 2019-02-17 | Disposition: A | Discharge status: Home / Self Care | Payer: Medicare Other | Source: Ambulatory Visit | Attending: Internal Medicine | Admitting: Internal Medicine

## 2019-01-29 DIAGNOSIS — E11621 Type 2 diabetes mellitus with foot ulcer: Secondary | ICD-10-CM | POA: Diagnosis not present

## 2019-01-29 DIAGNOSIS — Z7901 Long term (current) use of anticoagulants: Secondary | ICD-10-CM | POA: Diagnosis not present

## 2019-01-29 DIAGNOSIS — I69354 Hemiplegia and hemiparesis following cerebral infarction affecting left non-dominant side: Secondary | ICD-10-CM | POA: Diagnosis not present

## 2019-01-29 DIAGNOSIS — Z4781 Encounter for orthopedic aftercare following surgical amputation: Secondary | ICD-10-CM | POA: Diagnosis not present

## 2019-01-29 DIAGNOSIS — E1159 Type 2 diabetes mellitus with other circulatory complications: Secondary | ICD-10-CM | POA: Diagnosis not present

## 2019-01-29 DIAGNOSIS — R279 Unspecified lack of coordination: Secondary | ICD-10-CM | POA: Diagnosis not present

## 2019-01-29 DIAGNOSIS — E1169 Type 2 diabetes mellitus with other specified complication: Secondary | ICD-10-CM | POA: Diagnosis not present

## 2019-01-29 DIAGNOSIS — D649 Anemia, unspecified: Secondary | ICD-10-CM | POA: Diagnosis not present

## 2019-01-29 DIAGNOSIS — I739 Peripheral vascular disease, unspecified: Secondary | ICD-10-CM | POA: Diagnosis not present

## 2019-01-29 DIAGNOSIS — R262 Difficulty in walking, not elsewhere classified: Secondary | ICD-10-CM | POA: Diagnosis not present

## 2019-01-29 DIAGNOSIS — R5381 Other malaise: Secondary | ICD-10-CM | POA: Diagnosis not present

## 2019-01-29 DIAGNOSIS — Z7401 Bed confinement status: Secondary | ICD-10-CM | POA: Diagnosis not present

## 2019-01-29 DIAGNOSIS — I639 Cerebral infarction, unspecified: Secondary | ICD-10-CM | POA: Diagnosis not present

## 2019-01-29 DIAGNOSIS — Z741 Need for assistance with personal care: Secondary | ICD-10-CM | POA: Diagnosis not present

## 2019-01-29 DIAGNOSIS — I10 Essential (primary) hypertension: Secondary | ICD-10-CM | POA: Diagnosis not present

## 2019-01-29 DIAGNOSIS — M6281 Muscle weakness (generalized): Secondary | ICD-10-CM | POA: Diagnosis not present

## 2019-01-29 DIAGNOSIS — M2550 Pain in unspecified joint: Secondary | ICD-10-CM | POA: Diagnosis not present

## 2019-01-29 DIAGNOSIS — Z89411 Acquired absence of right great toe: Secondary | ICD-10-CM | POA: Diagnosis not present

## 2019-01-29 DIAGNOSIS — D638 Anemia in other chronic diseases classified elsewhere: Secondary | ICD-10-CM | POA: Diagnosis not present

## 2019-01-29 DIAGNOSIS — M869 Osteomyelitis, unspecified: Secondary | ICD-10-CM | POA: Diagnosis not present

## 2019-01-29 DIAGNOSIS — I825Y9 Chronic embolism and thrombosis of unspecified deep veins of unspecified proximal lower extremity: Secondary | ICD-10-CM | POA: Diagnosis not present

## 2019-01-29 DIAGNOSIS — E785 Hyperlipidemia, unspecified: Secondary | ICD-10-CM | POA: Diagnosis not present

## 2019-01-29 DIAGNOSIS — S98111A Complete traumatic amputation of right great toe, initial encounter: Secondary | ICD-10-CM | POA: Diagnosis not present

## 2019-01-29 DIAGNOSIS — E1149 Type 2 diabetes mellitus with other diabetic neurological complication: Secondary | ICD-10-CM | POA: Diagnosis not present

## 2019-01-29 LAB — BASIC METABOLIC PANEL
Anion gap: 7 (ref 5–15)
BUN: 41 mg/dL — ABNORMAL HIGH (ref 8–23)
CO2: 23 mmol/L (ref 22–32)
Calcium: 8.7 mg/dL — ABNORMAL LOW (ref 8.9–10.3)
Chloride: 106 mmol/L (ref 98–111)
Creatinine, Ser: 1.16 mg/dL (ref 0.61–1.24)
GFR calc Af Amer: >60 mL/min (ref >60)
GFR calc non Af Amer: >60 mL/min (ref >60)
Glucose, Bld: 109 mg/dL — ABNORMAL HIGH (ref 70–99)
Potassium: 4.4 mmol/L (ref 3.5–5.1)
Sodium: 136 mmol/L (ref 135–145)

## 2019-01-29 LAB — CBC
HCT: 36.3 % — ABNORMAL LOW (ref 39.0–52.0)
Hemoglobin: 11.9 g/dL — ABNORMAL LOW (ref 13.0–17.0)
MCH: 29.3 pg (ref 26.0–34.0)
MCHC: 32.8 g/dL (ref 30.0–36.0)
MCV: 89.4 fL (ref 80.0–100.0)
Platelets: 280 10*3/uL (ref 150–400)
RBC: 4.06 MIL/uL — ABNORMAL LOW (ref 4.22–5.81)
RDW: 13.3 % (ref 11.5–15.5)
WBC: 11.1 10*3/uL — ABNORMAL HIGH (ref 4.0–10.5)
nRBC: 0 % (ref 0.0–0.2)

## 2019-01-29 LAB — GLUCOSE, CAPILLARY
Glucose-Capillary: 130 mg/dL — ABNORMAL HIGH (ref 70–99)
Glucose-Capillary: 98 mg/dL (ref 70–99)

## 2019-01-29 MED ORDER — GLIPIZIDE 5 MG PO TABS: 2.5000 mg | ORAL_TABLET | Freq: Two times a day (BID) | ORAL | 0 refills | Status: DC

## 2019-01-29 NOTE — Discharge Summary (Signed)
Physician Discharge Summary  Wesley Ingram:[id number] DOB: [birth year] DOA: 01/17/2019  PCP: [facility]  Admit date: 01/17/2019 Discharge date: 01/29/2019  Admitted From: Home Disposition: SNF  Recommendations for Outpatient Follow-up:  1. Follow up with PCP in 1-2 weeks 2. Please obtain BMP/CBC in one week   Discharge Condition: Guarded CODE STATUS: Full Diet recommendation: As tolerated  Brief/Interim Summary: Wesley Pancoast Hawkinsis a 73 y.o.malewith medical history significant oftype 2 diabetes, hypertriglyceridemia, hypertension, renal disorder, history of CVA with residual left-sided hemiparesis who is coming to the emergency department for evaluation of his left big toe that after he was seen earlier by his home health nurse.He does not know how he acquired this injury, but states that he has had it for at least 2 weeks. He denies fever and night sweats, but feels chills and fatigue. No headache, rhinorrhea, sore throat, wheezing, hemoptysis or dyspnea. Denies chest pain, palpitations, dizziness, diaphoresis, PND, orthopnea or pitting edema of the lower extremities. Denies abdominal pain, diarrhea, constipation, melena or hematochezia. No dysuria, frequency or hematuria. Denies blurred vision, polyuria, polydipsia, polyphagia. In ED initial vital signs were temperature98.3 F, pulse 67, respiration 18, blood pressure 125/74 mmHg and O2 sat 97% on room air.He was given vancomycin in the emergency department. Patient transfered from [facility] to [facility] on 01/22/2019 for further vascular surgery intervention.Discussed with Dr. Seward Meth to do an arteriogram on 01/25/2019  - follow up with podiatry/ortho thereafter pending this evaluation.  Patient admitted as above with right great toe osteomyelitis with poorly healing diabetic foot ulcer with controlled non-insulin-dependent diabetes type 2.  Patient followed for vascular studies given concern for vascular deficiency  in the setting of chronic diabetes.  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  Patient's cultures remain negative.  We previously discontinue antibiotics per discussion with ID, patient remains likely stable, otherwise stable and agreeable for discharge to skilled nursing facility for ongoing physical therapy and rehab.  She will have close follow-up with vascular surgery next 1 to 2 weeks for further wound evaluation and treatment.  She is other chronic comorbid conditions as below remains somewhat stable, A1c well controlled, patient's glipizide actually decreased at admission due to hypoglycemia with moderate control.  His blood pressure and otherwise anemia are at baseline.  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  Patient educated at length about need for wound monitoring, signs or symptoms of bleeding as well as for wound infection, to monitor for wound output, foul smell or discoloration.  Discharge Diagnoses:  Principal Problem:   Diabetic foot ulcer (HCC) Active Problems:   Type 2 diabetes mellitus (HCC)   HTN (hypertension)   Hyperlipidemia   Anemia  Discharge Instructions  Discharge Instructions    Call MD for:  difficulty breathing, headache or visual disturbances   Complete by: As directed    Call MD for:  hives   Complete by: As directed    Call MD for:  persistant dizziness or light-headedness   Complete by: As directed    Call MD for:  persistant nausea and vomiting   Complete by: As directed    Call MD for:  redness, tenderness, or signs of infection (pain, swelling, redness, odor or green/yellow discharge around incision site)   Complete by: As directed    Call MD for:  severe uncontrolled pain   Complete by: As directed  Call MD for:  temperature >100.4   Complete by: As directed    Diet - low sodium heart healthy    Complete by: As directed    Increase activity slowly   Complete by: As directed      Allergies as of 01/29/2019   No Known Allergies     Medication List    TAKE these medications   Acidophilus 0.5 MG Tabs Take 2 tablets by mouth 2 (two) times daily.   amLODipine 2.5 MG tablet Commonly known as: NORVASC Take 1 tablet (2.5 mg total) by mouth daily.   apixaban 5 MG Tabs tablet Commonly known as: ELIQUIS Take 5 mg by mouth 2 (two) times daily.   atorvastatin 20 MG tablet Commonly known as: LIPITOR Take 20 mg by mouth every morning.   b complex vitamins tablet Take 1 tablet by mouth 2 (two) times daily.   bacitracin ointment Apply 1 application topically 2 (two) times daily.   glipiZIDE 5 MG tablet Commonly known as: GLUCOTROL Take 0.5 tablets (2.5 mg total) by mouth 2 (two) times daily before a meal. What changed: how much to take   Loperamide A-D 2 MG tablet Generic drug: loperamide Take 2 mg by mouth daily as needed for diarrhea or loose stools.   losartan 100 MG tablet Commonly known as: COZAAR Take 100 mg by mouth daily.   Melatonin 1 MG Tabs Take 3 mg by mouth at bedtime. HOLD WHILE IN HOSPITAL   metFORMIN 1000 MG tablet Commonly known as: GLUCOPHAGE Take 1,000 mg by mouth 2 (two) times daily with a meal.   metoprolol succinate 12.5 mg Tb24 24 hr tablet Commonly known as: TOPROL-XL Take 12.5 mg by mouth daily.       No Known Allergies  Consultations:  Vascular surgery, Dr. Myra Gianotti   Procedures/Studies: Mr Foot Right Wo Contrast  Result Date: 01/18/2019 CLINICAL DATA:  Diabetic patient with an open wound on the right great toe. EXAM: MRI OF THE RIGHT FOREFOOT WITHOUT CONTRAST TECHNIQUE: Multiplanar, multisequence MR imaging of the right forefoot was performed. No intravenous contrast was administered. COMPARISON:  Plain films right foot 01/17/2019. FINDINGS: Bones/Joint/Cartilage Motion degrades the examination. There is marrow edema throughout the  distal phalanx of the great toe consistent with osteomyelitis. No other evidence of osteomyelitis is identified. Ligaments Intact. Muscles and Tendons Intact. No intramuscular fluid collection. 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 Soft tissues Skin wound at the distal aspect of the great toe is identified. No abscess is seen. IMPRESSION: Motion degraded examination demonstrates marrow edema throughout the distal phalanx of the great toe consistent with osteomyelitis. No abscess is identified. No evidence of septic joint. Electronically Signed   By: Drusilla Kanner M.D.   On: 01/18/2019 07:35   US Arterial Abi (screening Lower Extremity)  Result Date: 01/18/2019 CLINICAL DATA:  Right diabetic open foot wound, great toe. Previous amputation. EXAM: NONINVASIVE PHYSIOLOGIC VASCULAR STUDY OF BILATERAL LOWER EXTREMITIES TECHNIQUE: Evaluation of both lower extremities were performed at rest, including calculation of ankle-brachial indices with single level Doppler, pressure and pulse volume recording. COMPARISON:  None. FINDINGS: Right ABI:  1.28 Left ABI:  1.16 Right Lower Extremity:  Multiphasic waveform at the ankle. Left Lower Extremity:  Multiphasic waveform at the ankle. IMPRESSION: Normal bilateral ABIs. Vascular calcification and resultant limited compressibility in the setting of diabetes can result in elevated values however. Electronically Signed   By: Ronald Pippins.D.  On: 01/18/2019 09:50   Dg Foot Complete Right  Result Date: 01/17/2019 CLINICAL DATA:  Swelling and drainage from right great toe. History of diabetes. EXAM: RIGHT FOOT COMPLETE - 3+ VIEW COMPARISON:  Radiographs 12/30/2018 FINDINGS: No acute fracture or traumatic malalignment. There is soft tissue swelling and ulceration at the tip of the first digit. No subcutaneous gas few punctate radiodensities are present within the soft tissues which are new from prior exam and  could reflect debris or foreign body. No bony erosion, destructive change or immature periostitis to suggest early radiographic features of osteomyelitis. 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 Hammertoe deformities of the third and fourth digits. May mild mid foot arthrosis is present as well. Please note alignment of the mid and hindfoot is incompletely assessed on nonweightbearing radiographs. Calcaneal spurs are present at the Achilles and plantar ligament insertions. IMPRESSION: 1. Soft tissue swelling and ulceration at the tip of the first digit. Few punctate radiodensities within the soft tissues are new from prior exam and could reflect debris or foreign body and should be correlated with visual inspection. 2. No radiographic evidence of osteomyelitis. MRI is more sensitive and specific for early findings of osteomyelitis. Electronically Signed   By: Kreg Shropshire M.D.   On: 01/17/2019 21:41   Dg Foot Complete Right  Result Date: 12/30/2018 CLINICAL DATA:  Infection, right great toe EXAM: RIGHT FOOT COMPLETE - 3+ VIEW COMPARISON:  None. FINDINGS: No fracture or dislocation of the right foot. No bony erosion or sclerosis of the great toe to suggest osteomyelitis. There is moderate first metatarsophalangeal arthrosis. Mild midfoot arthrosis. The remaining joint spaces are well preserved. Mild soft tissue edema of the great toe. IMPRESSION: No fracture or dislocation of the right foot. No bony erosion or sclerosis of the great toe to suggest osteomyelitis. There is moderate first metatarsophalangeal arthrosis. Mild midfoot arthrosis. The remaining joint spaces are well preserved. Mild soft tissue edema of the great toe. MRI may be used to more sensitively evaluate for bone marrow edema and osteomyelitis if suspected. Electronically Signed   By: Lauralyn Primes M.D.   On: 12/30/2018 13:10    Subjective: No acute issues or events overnight, declines fevers, chills, nausea,  vomiting, diarrhea, constipation, headache, pain, shortness of breath.   Discharge Exam: Vitals:   01/28/19 1958 01/29/19 0441  BP: 120/72 118/61  Pulse: 81 63  Resp:  18  Temp: 98.5 F (36.9 C) 98.4 F (36.9 C)  SpO2: 97% 97%   Vitals:   01/28/19 0849 01/28/19 1446 01/28/19 1958 01/29/19 0441  BP:  134/75 120/72 118/61  Pulse: 70 71 81 63  Resp:  18  18  Temp:  98.1 F (36.7 C) 98.5 F (36.9 C) 98.4 F (36.9 C)  TempSrc:  Oral Oral Oral  SpO2:  96% 97% 97%  Weight:      Height:       Gen:- Awake Alert,  In no apparent distress  HEENT:- [place].AT, No sclera icterus Neck-Supple Neck,No JVD,.  Lungs-  CTAB , fair symmetrical air movement CV- S1, S2 normal, regular  Abd-  +ve B.Sounds, Abd Soft, No tenderness,    Extremity--right foot bandage clean dry intact Psych-affect is appropriate, oriented x3 Neuro-Chronic Lt sided Hemiparesis, but no new focal deficits, no tremors   The results of significant diagnostics from this hospitalization (including imaging, microbiology, ancillary and laboratory) are listed below for reference.     Microbiology: Recent Results (from the past 240 hour(s))  Gastrointestinal Panel by  PCR , Stool     Status: None   Collection Time: 01/20/19  5:37 PM   Specimen: Stool  Result Value Ref Range Status   Campylobacter species NOT DETECTED NOT DETECTED Final   Plesimonas shigelloides NOT DETECTED NOT DETECTED Final   Salmonella species NOT DETECTED NOT DETECTED Final   Yersinia enterocolitica NOT DETECTED NOT DETECTED Final   Vibrio species NOT DETECTED NOT DETECTED Final   Vibrio cholerae NOT DETECTED NOT DETECTED Final   Enteroaggregative E coli (EAEC) NOT DETECTED NOT DETECTED Final   Enteropathogenic E coli (EPEC) NOT DETECTED NOT DETECTED Final   Enterotoxigenic E coli (ETEC) NOT DETECTED NOT DETECTED Final   Shiga like toxin producing E coli (STEC) NOT DETECTED NOT DETECTED Final   Shigella/Enteroinvasive E coli (EIEC) NOT DETECTED NOT  DETECTED Final   Cryptosporidium NOT DETECTED NOT DETECTED Final   Cyclospora cayetanensis NOT DETECTED NOT DETECTED Final   Entamoeba histolytica NOT DETECTED NOT DETECTED Final   Giardia lamblia NOT DETECTED NOT DETECTED Final   Adenovirus F40/41 NOT DETECTED NOT DETECTED Final   Astrovirus NOT DETECTED NOT DETECTED Final   Norovirus GI/GII NOT DETECTED NOT DETECTED Final   Rotavirus A NOT DETECTED NOT DETECTED Final   Sapovirus (I, II, IV, and V) NOT DETECTED NOT DETECTED Final    Comment: Performed at [facility], [address] [place], [place] [address]  SARS CORONAVIRUS 2 Nasal Swab Aptima Multi Swab     Status: None   Collection Time: 01/27/19  9:00 AM   Specimen: Aptima Multi Swab; Nasal Swab  Result Value Ref Range Status   SARS Coronavirus 2 NEGATIVE NEGATIVE Final    Comment: (NOTE) SARS-CoV-2 target nucleic acids are NOT DETECTED. The SARS-CoV-2 RNA is generally detectable in upper and lower respiratory specimens during the acute phase of infection. Negative results do not preclude SARS-CoV-2 infection, do not rule out co-infections with other pathogens, and should not be used as the sole basis for treatment or other patient management decisions. Negative results must be combined with clinical observations, patient history, and epidemiological information. The expected result is Negative. Fact Sheet for Patients: HairSlick.no Fact Sheet for Healthcare Providers: quierodirigir.com This test is not yet approved or cleared by the [place] FDA and  has been authorized for detection and/or diagnosis of SARS-CoV-2 by FDA under an Emergency Use Authorization (EUA). This EUA will remain  in effect (meaning this test can be used) for the duration of the COVID-19 declaration under Section 56 4(b)(1) of the Act, 21 U.S.C. section 360bbb-3(b)(1), unless the authorization is terminated or revoked  sooner. Performed at [facility], [address], [place], [place] [address]      Labs: BNP (last 3 results) No results for input(s): BNP in the last 8760 hours. Basic Metabolic Panel: Recent Labs  Lab 01/25/19 0517 01/26/19 0509 01/27/19 0347 01/28/19 0317 01/29/19 0539  NA 138 136 139 138 136  K 4.3 4.2 4.2 4.0 4.4  CL 104 103 106 105 106  CO2 24 24 24 25 23   GLUCOSE 127* 112* 120* 123* 109*  BUN 34* 32* 34* 34* 41*  CREATININE 1.22 1.16 1.16 1.06 1.16  CALCIUM 9.0 8.7* 8.7* 8.7* 8.7*   Liver Function Tests: No results for input(s): AST, ALT, ALKPHOS, BILITOT, PROT, ALBUMIN in the last 168 hours. No results for input(s): LIPASE, AMYLASE in the last 168 hours. No results for input(s): AMMONIA in the last 168 hours. CBC: Recent Labs  Lab 01/25/19 0517  01/26/19 0509 01/27/19 0347 01/28/19 0317 01/29/19 0539  WBC 10.4 12.0* 9.8 10.8* 11.1*  HGB 12.3* 11.4* 11.4* 11.4* 11.9*  HCT 37.5* 34.6* 35.0* 34.6* 36.3*  MCV 89.9 89.6 89.5 89.9 89.4  PLT 350 300 300 283 280   Urinalysis    Component Value Date/Time   COLORURINE YELLOW 10/19/2016 1729   APPEARANCEUR CLOUDY (A) 10/19/2016 1729   LABSPEC 1.021 10/19/2016 1729   PHURINE 7.0 10/19/2016 1729   GLUCOSEU 150 (A) 10/19/2016 1729   HGBUR SMALL (A) 10/19/2016 1729   BILIRUBINUR NEGATIVE 10/19/2016 1729   KETONESUR 5 (A) 10/19/2016 1729   PROTEINUR 100 (A) 10/19/2016 1729   NITRITE NEGATIVE 10/19/2016 1729   LEUKOCYTESUR MODERATE (A) 10/19/2016 1729   Microbiology Recent Results (from the past 240 hour(s))  Gastrointestinal Panel by PCR , Stool     Status: None   Collection Time: 01/20/19  5:37 PM   Specimen: Stool  Result Value Ref Range Status   Campylobacter species NOT DETECTED NOT DETECTED Final   Plesimonas shigelloides NOT DETECTED NOT DETECTED Final   Salmonella species NOT DETECTED NOT DETECTED Final   Yersinia enterocolitica NOT DETECTED NOT DETECTED Final   Vibrio species NOT DETECTED NOT  DETECTED Final   Vibrio cholerae NOT DETECTED NOT DETECTED Final   Enteroaggregative E coli (EAEC) NOT DETECTED NOT DETECTED Final   Enteropathogenic E coli (EPEC) NOT DETECTED NOT DETECTED Final   Enterotoxigenic E coli (ETEC) NOT DETECTED NOT DETECTED Final   Shiga like toxin producing E coli (STEC) NOT DETECTED NOT DETECTED Final   Shigella/Enteroinvasive E coli (EIEC) NOT DETECTED NOT DETECTED Final   Cryptosporidium NOT DETECTED NOT DETECTED Final   Cyclospora cayetanensis NOT DETECTED NOT DETECTED Final   Entamoeba histolytica NOT DETECTED NOT DETECTED Final   Giardia lamblia NOT DETECTED NOT DETECTED Final   Adenovirus F40/41 NOT DETECTED NOT DETECTED Final   Astrovirus NOT DETECTED NOT DETECTED Final   Norovirus GI/GII NOT DETECTED NOT DETECTED Final   Rotavirus A NOT DETECTED NOT DETECTED Final   Sapovirus (I, II, IV, and V) NOT DETECTED NOT DETECTED Final    Comment: Performed at [facility], [address] [place], [place] [address]  SARS CORONAVIRUS 2 Nasal Swab Aptima Multi Swab     Status: None   Collection Time: 01/27/19  9:00 AM   Specimen: Aptima Multi Swab; Nasal Swab  Result Value Ref Range Status   SARS Coronavirus 2 NEGATIVE NEGATIVE Final    Comment: (NOTE) SARS-CoV-2 target nucleic acids are NOT DETECTED. The SARS-CoV-2 RNA is generally detectable in upper and lower respiratory specimens during the acute phase of infection. Negative results do not preclude SARS-CoV-2 infection, do not rule out co-infections with other pathogens, and should not be used as the sole basis for treatment or other patient management decisions. Negative results must be combined with clinical observations, patient history, and epidemiological information. The expected result is Negative. Fact Sheet for Patients: HairSlick.no Fact Sheet for Healthcare Providers: quierodirigir.com This test is not yet approved or cleared  by the [place] FDA and  has been authorized for detection and/or diagnosis of SARS-CoV-2 by FDA under an Emergency Use Authorization (EUA). This EUA will remain  in effect (meaning this test can be used) for the duration of the COVID-19 declaration under Section 56 4(b)(1) of the Act, 21 U.S.C. section 360bbb-3(b)(1), unless the authorization is terminated or revoked sooner. Performed at [facility], [address] [place], [place] [address]  Time coordinating discharge: Over 30 minutes  SIGNED:   Azucena Fallen, DO [facility] 01/29/2019, 10:54 AM Pager   If 7PM-7AM, please contact night-coverage [email or web address] Password [email or web address]

## 2019-01-29 NOTE — Progress Notes (Signed)
Pt discharged to SNF in stable condition after going over discharge instructions with pt and receiving facility nurse with no concerns voiced. Transported by ptar at 1250hrs

## 2019-01-29 NOTE — TOC Progression Note (Addendum)
Transition of Care  [facility]) - Progression Note    Patient Details  Name: JAVANTE NILSSON MRN: [id number] Date of Birth: [birth year]  Transition of [facility] CM/SW Contact  Katleen Carraway, Edson Snowball, RN Phone Number: 01/29/2019, 11:43 AM  Clinical Narrative:     Damaris Schooner to [facility] at [facility], she has insurance authorization, and can take patient today . Sent discharge summary to [facility]. Will confirm receipt.  Spoke to patient , patient aware plan to discharge to [facility] today. Called Patient's wife Mrs Mollett aware he is discharging to [facility] today.  Number to call report [phone].   Expected Discharge Plan: Skilled Nursing Facility Barriers to Discharge: Continued Medical Work up  Expected Discharge Plan and Services Expected Discharge Plan: [place] In-house Referral: Nutrition Discharge Planning Services: CM Consult   Living arrangements for the past 2 months: Single Family Home Expected Discharge Date: 01/29/19               DME Arranged: N/A         HH Arranged: NA           Social Determinants of Health (SDOH) Interventions    Readmission Risk Interventions No flowsheet data found.

## 2019-01-29 NOTE — Progress Notes (Signed)
Physical Therapy Treatment Patient Details Name: Wesley Ingram MRN: [id number] DOB: December 02, 1945 Today's Date: 01/29/2019    History of Present Illness Pt is a 73 y/o male with residual L hemiparesis was admitted for ulcer on R great toe with arteriogram on RLE scheduled 01/25/19.  Pt is home with wife, note his wound was referred to ED by home health nurse.  Pt has PMHx:  HTN, CVA, DM, HLD, renal disorder, anemia    PT Comments    The pt remains motivated and is progressing with mobility. Acute PT to continue during pt's hospital stay.   Follow Up Recommendations  SNF     Equipment Recommendations  None recommended by PT    Precautions / Restrictions Precautions Precautions: Fall Precaution Comments: L hemiparesis from prior stroke Required Braces or Orthoses: Other Brace Other Brace: post op shoe on right foot Restrictions Weight Bearing Restrictions: No    Mobility  Bed Mobility   Bed Mobility: Supine to Sit     Supine to sit: Min assist     General bed mobility comments: increased time and effort, assistance needed for trunk elevation  Transfers   Equipment used: Hemi-walker Transfers: Sit to/from [employer] Sit to Stand: Min assist         General transfer comment: min assist to stand x2 reps from elevated bed (just high enough to get hips above knees as pt is 5'10". cues for weight shifting. in stading with min/mod assist pt  took 2 lateral steps toward head of bed with each stand. cues/facilitation for weight shifting, posture, and walker placement.      Cognition Arousal/Alertness: Awake/alert Behavior During Therapy: WFL for tasks assessed/performed Overall Cognitive Status: Within Functional Limits for tasks assessed        General Comments: appropriate for session today.      Exercises Other Exercises Other Exercises: seated edge of bed- left LE hamstring stretching for 30 sec's x3 reps due to tightness prior to standing.     Pertinent Vitals/Pain Pain Assessment: No/denies pain     PT Goals (current goals can now be found in the care plan section) Acute Rehab PT Goals Patient Stated Goal: to sit alone on side of bed PT Goal Formulation: With patient Time For Goal Achievement: 02/06/19 Potential to Achieve Goals: Fair    Frequency    Min 3X/week      PT Plan Current plan remains appropriate    AM-PAC PT "6 Clicks" Mobility   Outcome Measure  Help needed turning from your back to your side while in a flat bed without using bedrails?: A Little Help needed moving from lying on your back to sitting on the side of a flat bed without using bedrails?: A Little Help needed moving to and from a bed to a chair (including a wheelchair)?: A Little Help needed standing up from a chair using your arms (e.g., wheelchair or bedside chair)?: A Little Help needed to walk in hospital room?: A Lot Help needed climbing 3-5 steps with a railing? : Total 6 Click Score: 15    End of Session Equipment Utilized During Treatment: Gait belt Activity Tolerance: Patient tolerated treatment well;No increased pain Patient left: with nursing/sitter in room;Other (comment)(left seated edge of bed with NT assisting him to dress for discharge to SNF) Nurse Communication: Mobility status PT Visit Diagnosis: Other abnormalities of gait and mobility (R26.89);Muscle weakness (generalized) (M62.81) Hemiplegia - Right/Left: Left Hemiplegia - dominant/non-dominant: Non-dominant Hemiplegia - caused by: Unspecified  Time: 0093-8182 PT Time Calculation (min) (ACUTE ONLY): 12 min  Charges:  $Therapeutic Activity: 8-22 mins                    Sallyanne Kuster, PTA, [facility] Acute [employer] Office- [phone] 01/29/19, 12:42 PM  Sallyanne Kuster 01/29/2019, 12:41 PM

## 2019-01-30 ENCOUNTER — Non-Acute Institutional Stay (SKILLED_NURSING_FACILITY): Payer: Medicare Other | Admitting: Adult Health

## 2019-01-30 ENCOUNTER — Encounter: Payer: Self-pay | Admitting: Adult Health

## 2019-01-30 ENCOUNTER — Telehealth: Payer: Self-pay | Admitting: Surgery

## 2019-01-30 DIAGNOSIS — E1169 Type 2 diabetes mellitus with other specified complication: Secondary | ICD-10-CM

## 2019-01-30 DIAGNOSIS — I10 Essential (primary) hypertension: Secondary | ICD-10-CM

## 2019-01-30 DIAGNOSIS — I639 Cerebral infarction, unspecified: Secondary | ICD-10-CM

## 2019-01-30 DIAGNOSIS — S98111A Complete traumatic amputation of right great toe, initial encounter: Secondary | ICD-10-CM

## 2019-01-30 DIAGNOSIS — D638 Anemia in other chronic diseases classified elsewhere: Secondary | ICD-10-CM

## 2019-01-30 DIAGNOSIS — E1149 Type 2 diabetes mellitus with other diabetic neurological complication: Secondary | ICD-10-CM | POA: Diagnosis not present

## 2019-01-30 DIAGNOSIS — E785 Hyperlipidemia, unspecified: Secondary | ICD-10-CM

## 2019-01-30 DIAGNOSIS — M869 Osteomyelitis, unspecified: Secondary | ICD-10-CM

## 2019-01-30 DIAGNOSIS — E1159 Type 2 diabetes mellitus with other circulatory complications: Secondary | ICD-10-CM | POA: Diagnosis not present

## 2019-01-30 NOTE — Progress Notes (Signed)
Location:   [facility] Room Number: [place] of Service:  SNF (31)   CODE STATUS: DNR  No Known Allergies  Chief Complaint  Patient presents with  . Hospitalization Follow-up    Hospital Follow up    HPI:  He is a 73 year old man who has been hospitalized from 01-17-19 through 01-29-19. He had a right great toe nonhealing ulceration. He developed osteomyelitis in the toe and had an amputation. He is here for short term rehab with his goal to return back home. He states he  has ramp to his home; lives with his wife; he has a wheelchair and hemi-walker . He denies any uncontrolled pain; no changes in appetite; no insomnia or anxiety he will continue to be followed for his chronic illnesses including: diabetes hypertension dyslipidemia.   Past Medical History:  Diagnosis Date  . Diabetes mellitus   . DVT (deep venous thrombosis) ([place])   . Foot ulcer due to secondary DM ([place])   . High triglycerides   . Hypertension   . PVD (peripheral vascular disease) ([place])   . Renal disorder   . 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 AMPUTATION Right 01/25/2019   Procedure: AMPUTATION RIGHT GREAT TOE;  Surgeon: Serafina Mitchell, MD;  Location: [facility] OR;  Service: Vascular;  Laterality: Right;  . COLONOSCOPY  2009   [facility] in [place]: diverticulosis, next TCS in 07/2017  . CYSTOSCOPY WITH RETROGRADE PYELOGRAM, URETEROSCOPY AND STENT PLACEMENT Right 11/19/2016   Procedure: CYSTOSCOPY WITH RETROGRADE PYELOGRAM, URETEROSCOPY AND STENT EXCHANGE;  Surgeon: Alexis Frock, MD;  [facility]S;  Service: Urology;  Laterality: Right;  . CYSTOSCOPY WITH STENT PLACEMENT Right 10/19/2016   Procedure: CYSTOSCOPY WITH STENT PLACEMENT;  Surgeon: Alexis Frock, MD;  Location[facility];  Service: Urology;  Laterality: Right;  . ENDARTERECTOMY     Left popliteal artery endarterectomy with left above-knee popliteal  artery to tibial peroneal trunk bypass with reverse saphenous vein  . HERNIA REPAIR     bilateral inguinal  . HOLMIUM LASER APPLICATION Right 12/17/1605   Procedure: HOLMIUM LASER APPLICATION;  Surgeon: Alexis Frock, MD;  [facility]S;  Service: Urology;  Laterality: Right;  . TOE AMPUTATION    . TONSILLECTOMY      Social History   Socioeconomic History  . Marital status: Married    Spouse name: Not on file  . Number of children: Not on file  . Years of education: Not on file  . Highest education level: Not on file  Occupational History  . Occupation: disabled  Social Needs  . Financial resource strain: Not on file  . Food insecurity    Worry: Not on file    Inability: Not on file  . Transportation needs    Medical: Not on file    Non-medical: Not on file  Tobacco Use  . Smoking status: Never Smoker  . Smokeless tobacco: Never Used  Substance and Sexual Activity  . Alcohol use: No  . Drug use: No  . Sexual activity: Not on file  Lifestyle  . Physical activity    Days per week: Not on file    Minutes per session: Not on file  . Stress: Not on file  Relationships  . Social Herbalist on phone: Not on file    Gets together: Not on file    Attends religious service:  Not on file    Active member of club or organization: Not on file    Attends meetings of clubs or organizations: Not on file    Relationship status: Not on file  . Intimate partner violence    Fear of current or ex partner: Not on file    Emotionally abused: Not on file    Physically abused: Not on file    Forced sexual activity: Not on file  Other Topics Concern  . Not on file  Social History Narrative  . Not on file   Family History  Problem Relation Age of Onset  . Multiple myeloma Mother 43  . Colon cancer Neg Hx       VITAL SIGNS BP 127/75   Pulse 68   Temp 98.2 F (36.8 C)   Resp 19   Ht _0  (1.778 m)   Wt 162 lb 1.6 oz (73.5 kg)   BMI 23.26 kg/m   Outpatient  Encounter Medications as of 01/30/2019  Medication Sig  . amLODipine (NORVASC) 2.5 MG tablet Take 1 tablet (2.5 mg total) by mouth daily.  Marland Kitchen apixaban (ELIQUIS) 5 MG TABS tablet Take 5 mg by mouth 2 (two) times daily.  Marland Kitchen atorvastatin (LIPITOR) 20 MG tablet Take 20 mg by mouth every morning.   Marland Kitchen b complex vitamins tablet Take 1 tablet by mouth 2 (two) times daily.  Marland Kitchen glipiZIDE (GLUCOTROL) 5 MG tablet Take 0.5 tablets (2.5 mg total) by mouth 2 (two) times daily before a meal.  . Lactobacillus (ACIDOPHILUS) 0.5 MG TABS Take 2 tablets by mouth 2 (two) times daily.   Marland Kitchen loperamide (LOPERAMIDE A-D) 2 MG tablet Take 2 mg by mouth daily as needed for diarrhea or loose stools.   Marland Kitchen losartan (COZAAR) 100 MG tablet Take 100 mg by mouth daily.  . metFORMIN (GLUCOPHAGE) 1000 MG tablet Take 1,000 mg by mouth 2 (two) times daily with a meal.  . metoprolol succinate (TOPROL-XL) 12.5 mg TB24 24 hr tablet Take 12.5 mg by mouth daily.  . NON FORMULARY Diet Type:  NAS, Consistent Carbohydrate  . [DISCONTINUED] bacitracin ointment Apply 1 application topically 2 (two) times daily. (Patient not taking: Reported on 01/30/2019)  . [DISCONTINUED] Melatonin 1 MG TABS Take 3 mg by mouth at bedtime. [place]    No facility-administered encounter medications on file as of 01/30/2019.      SIGNIFICANT DIAGNOSTIC EXAMS  TODAY;   01-17-19: right foot x-ray:  1. Soft tissue swelling and ulceration at the tip of the first digit. Few punctate radiodensities within the soft tissues are new from prior exam and could reflect debris or foreign body and should be correlated with visual inspection. 2. No radiographic evidence of osteomyelitis. MRI is more sensitive and specific for early findings of osteomyelitis.  01-18-19: right foot MRI:  Motion degraded examination demonstrates marrow edema throughout the distal phalanx of the great toe consistent with osteomyelitis. No abscess is identified. No evidence of septic  joint.  01-18-19: bilateral ABI: Normal bilateral ABIs. Vascular calcification and resultant limited compressibility in the setting of diabetes can result in elevated values however. Right 1.28 left 1.16  LABS REVIEWED TODAY:   01-17-19: wbc 11.8; hgb 11.7; hct 36.0; mcv 89.8; plt 364; glucose 181; bun 37; creat 1.21; k+ 4.4; na++ 133; ca 8.9; blood culture: no growth; pre-albumin 21.4; CRP 1.8 sed rate 52 01-18-19: wbc 10.9; hct 10.7; hct 33.1; mcv 91.2; plt 308; hgb a1c 6.3 01-20-19: GI panel: neg 01-23-19:  wbc 10.0; hgb 12.0; hct 36.3; mcv 89.4; plt 349; glucose 111; bun 37; creat 1.32; k+ 4.3; na++ 138; ca 9.0 01-29-19: wbc 11.1; bgt 11.9; hct 36.3; mcv 89.4; plt 280; gluocose 109; bun 41; creat 1.16; k+ 4.4; na++ 136; ca 8.7    Review of Systems  Constitutional: Negative for malaise/fatigue.  Respiratory: Negative for cough and shortness of breath.   Cardiovascular: Negative for chest pain, palpitations and leg swelling.  Gastrointestinal: Negative for abdominal pain, constipation and heartburn.  Musculoskeletal: Negative for back pain, joint pain and myalgias.  Skin: Negative.   Neurological: Negative for dizziness.  Psychiatric/Behavioral: The patient is not nervous/anxious.     Physical Exam Constitutional:      General: He is not in acute distress.    Appearance: He is well-developed. He is not diaphoretic.  Neck:     Musculoskeletal: Neck supple.     Thyroid: No thyromegaly.  Cardiovascular:     Rate and Rhythm: Normal rate and regular rhythm.     Pulses: Normal pulses.     Heart sounds: Normal heart sounds.  Pulmonary:     Effort: Pulmonary effort is normal. No respiratory distress.     Breath sounds: Normal breath sounds.  Abdominal:     General: Bowel sounds are normal. There is no distension.     Palpations: Abdomen is soft.     Tenderness: There is no abdominal tenderness.  Musculoskeletal:     Right lower leg: No edema.     Left lower leg: No edema.     Comments:  Has left hemiparesis   Lymphadenopathy:     Cervical: No cervical adenopathy.  Skin:    General: Skin is warm and dry.     Comments: Right great toe sutures intact has maceration present   Neurological:     Mental Status: He is alert and oriented to person, place, and time.  Psychiatric:        Mood and Affect: Mood normal.        ASSESSMENT/ PLAN:  TODAY:   1. Cerebrovascular accident unspecified mechanism: has left hemiparesis: is stable will continue eliquis 5 mg twice daily is on statin   2. Hypertension associated with type 2 diabetes mellitus : is stable b/p 127/75: will continue norvasc 2.5 mg daily; toprol xl 12.5 mg daily  and cozaar 100 mg daily   3. Dyslipidemia associated with type 2 diabetes mellitus: is stable will continue lipitor 20 mg daily   4. 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 He is not requiring use of narcotic pain medication at this time.   6. Anemia associated with type 2 diabetes mellitus: is stable hgb 11.9 will monitor  On 02-07-19: will check cbc; bmp    MD is aware of resident's narcotic use and is in agreement with current plan of care. We will attempt to wean resident as apropriate   Ok Edwards NP [facility]  Contact [phone] Monday through Friday 8am- 5pm  After hours call [phone]

## 2019-01-30 NOTE — Telephone Encounter (Signed)
I called the [facility] and spoke with Larena Glassman, she states the patient does not have authorization for this visit or hospital visit.  Patient will need to call or see [facility] Reubin Milan at the [facility]  [phone]

## 2019-01-31 ENCOUNTER — Non-Acute Institutional Stay (SKILLED_NURSING_FACILITY): Payer: Medicare Other | Admitting: Internal Medicine

## 2019-01-31 ENCOUNTER — Encounter: Payer: Self-pay | Admitting: Internal Medicine

## 2019-01-31 DIAGNOSIS — S98111A Complete traumatic amputation of right great toe, initial encounter: Secondary | ICD-10-CM | POA: Diagnosis not present

## 2019-01-31 DIAGNOSIS — E1159 Type 2 diabetes mellitus with other circulatory complications: Secondary | ICD-10-CM

## 2019-01-31 DIAGNOSIS — E11621 Type 2 diabetes mellitus with foot ulcer: Secondary | ICD-10-CM | POA: Diagnosis not present

## 2019-01-31 DIAGNOSIS — L97519 Non-pressure chronic ulcer of other part of right foot with unspecified severity: Secondary | ICD-10-CM

## 2019-01-31 DIAGNOSIS — I825Y9 Chronic embolism and thrombosis of unspecified deep veins of unspecified proximal lower extremity: Secondary | ICD-10-CM

## 2019-01-31 DIAGNOSIS — M869 Osteomyelitis, unspecified: Secondary | ICD-10-CM

## 2019-01-31 DIAGNOSIS — I152 Hypertension secondary to endocrine disorders: Secondary | ICD-10-CM

## 2019-01-31 DIAGNOSIS — E1169 Type 2 diabetes mellitus with other specified complication: Secondary | ICD-10-CM

## 2019-01-31 DIAGNOSIS — E785 Hyperlipidemia, unspecified: Secondary | ICD-10-CM

## 2019-01-31 DIAGNOSIS — I10 Essential (primary) hypertension: Secondary | ICD-10-CM

## 2019-01-31 NOTE — Progress Notes (Signed)
: Provider:  Noah Delaine. Winferd Wease MD Location:  [facility] Room Number: 156/W Place of Service:  SNF (31)  PCP: [place] Patient Care Team: Anton as PCP - General (General Practice) Rourk, Cristopher Estimable, MD as Consulting Physician (Gastroenterology)  Extended Emergency Contact Information Primary Emergency Contact: Lanelle Bal Address: [address]          [place], [place] [address] [place] Phone: [phone] Mobile Phone: [phone] Relation: Spouse     Allergies: Patient has no known allergies.  Chief Complaint  Patient presents with   New Admit To SNF    History and physical    HPI: Patient is 73 y.o. male with type 2 diabetes, hyperlipidemia, hypertension, renal disorder, history of CVA with residual left-sided hemiparesis, who presented to the emergency department at any pain hospital for evaluation of his left great toe after being seen earlier by his home health nurse.  Patient apparently had an injury to his great toe around 2 weeks ago but he denies any knowledge of it.  He denied fever chills fatigue headache rhinorrhea sore throat wheezing hemoptysis dyspnea chest pain palpitations dizziness diaphoresis PND orthopnea or pitting edema of lower extremities.  No abdominal pain diarrhea constipation melena or hematochezia, dysuria or frequency or hematuria, blurred vision polyuria polydipsia polyphagia.  In the ED vital signs were stable with O2 saturation 97% on room air patient was given vancomycin in the emergency department and transferred to [facility] on 8/10 for further vascular surgery intervention.  Emesis: Patient had a normal vascular work-up and his great toe was amputated on 8/13 with clean margins secondary to osteomyelitis.  After discussions with ID it was felt the patient did not need to be on antibiotics any longer..  Patient did have some problems with hypoglycemia and his glipizide was  decreased.  All of patient's were baseline and patient is admitted to skilled nursing facility for OT/PT.  While at skilled nursing facility patient will be followed for hypertension treated with Norvasc and losartan and Toprol XL, hyperlipidemia treated with Lipitor chronic DVT treated with Eliquis.  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    Past Surgical History:  Procedure Laterality Date   AMPUTATION Right 01/25/2019   Procedure: AMPUTATION RIGHT GREAT TOE;  Surgeon: Serafina Mitchell, MD;  Location: [facility];  Service: Vascular;  Laterality: Right;   COLONOSCOPY  2009   [facility] in [place]: diverticulosis, next TCS in 07/2017   CYSTOSCOPY [place], URETEROSCOPY AND STENT PLACEMENT Right 11/19/2016   Procedure: CYSTOSCOPY WITH RETROGRADE PYELOGRAM, URETEROSCOPY AND STENT EXCHANGE;  Surgeon: Alexis Frock, MD;  [facility];  Service: Urology;  Laterality: Right;   CYSTOSCOPY WITH STENT PLACEMENT Right 10/19/2016   Procedure: CYSTOSCOPY WITH STENT PLACEMENT;  Surgeon: Alexis Frock, MD;  [facility];  Service: Urology;  Laterality: Right;   ENDARTERECTOMY     Left popliteal artery endarterectomy with left above-knee popliteal artery to tibial peroneal trunk bypass with reverse saphenous vein   HERNIA REPAIR     bilateral inguinal   HOLMIUM LASER APPLICATION Right 0/06/6008   Procedure: HOLMIUM LASER APPLICATION;  Surgeon: Alexis Frock, MD;  [facility];  Service: Urology;  Laterality: Right;   TOE AMPUTATION     TONSILLECTOMY      Outpatient Encounter Medications as of 01/31/2019  Medication Sig   amLODipine (NORVASC) 2.5 MG tablet Take 1 tablet (2.5 mg total) by mouth daily.   apixaban  (ELIQUIS) 5 MG TABS tablet Take 5 mg by mouth 2 (two) times daily.   atorvastatin (LIPITOR) 20 MG tablet Take 20 mg by mouth every morning.    b complex vitamins tablet Take 1 tablet by mouth 2 (two) times daily.   glipiZIDE (GLUCOTROL) 5 MG tablet Take 0.5 tablets (2.5 mg total) by mouth 2 (two) times daily before a meal.   Lactobacillus (ACIDOPHILUS) 0.5 MG TABS Take 2 tablets by mouth 2 (two) times daily.    loperamide (LOPERAMIDE A-D) 2 MG tablet Take 2 mg by mouth daily as needed for diarrhea or loose stools.    losartan (COZAAR) 100 MG tablet Take 100 mg by mouth daily.   metFORMIN (GLUCOPHAGE) 1000 MG tablet Take 1,000 mg by mouth 2 (two) times daily with a meal.   metoprolol succinate (TOPROL-XL) 12.5 mg TB24 24 hr tablet Take 12.5 mg by mouth daily.   NON FORMULARY Diet Type: Regular, NAS, Consistent Carbohydrate   No facility-administered encounter medications on file as of 01/31/2019.      No orders of the defined types were placed in this encounter.    There is no immunization history on file for this patient.  Social History   Tobacco Use   Smoking status: Never Smoker   Smokeless tobacco: Never Used  Substance Use Topics   Alcohol use: No    Family history is   Family History  Problem Relation Age of Onset   Multiple myeloma Mother 49   Colon cancer Neg Hx       Review of Systems  DATA OBTAINED: from patient-limited; nurse GENERAL:  no fevers, fatigue, appetite changes SKIN: No itching, or rash EYES: No eye pain, redness, discharge EARS: No earache, tinnitus, change in hearing NOSE: No congestion, drainage or bleeding  MOUTH/THROAT: No mouth or tooth pain, No sore throat RESPIRATORY: No cough, wheezing, SOB CARDIAC: No chest pain, palpitations, lower extremity edema  GI: No abdominal pain, No N/V/D or constipation, No heartburn or reflux  GU: No dysuria, frequency or urgency, or incontinence  MUSCULOSKELETAL: No unrelieved bone/joint  pain NEUROLOGIC: No headache, dizziness or focal weakness PSYCHIATRIC: No c/o anxiety or sadness   Vitals:   01/31/19 1220  BP: 127/75  Pulse: 68  Resp: 19  Temp: 98.2 F (36.8 C)    SpO2 Readings from Last 1 Encounters:  01/29/19 97%   Body mass index is 23.26 kg/m.     Physical Exam  GENERAL APPEARANCE: Alert, conversant,  No acute distress.  SKIN: No diaphoresis rash HEAD: Normocephalic, atraumatic  EYES: Conjunctiva/lids clear. Pupils round, reactive. EOMs intact.  EARS: External exam WNL, canals clear. Hearing grossly normal.  NOSE: No deformity or discharge.  MOUTH/THROAT: Lips w/o lesions  RESPIRATORY: Breathing is even, unlabored. Lung sounds are clear   CARDIOVASCULAR: Heart RRR no murmurs, rubs or gallops. No peripheral edema.   GASTROINTESTINAL: Abdomen is soft, non-tender, not distended w/ normal bowel sounds. GENITOURINARY: Bladder non tender, not distended  MUSCULOSKELETAL: Dressing right great toe and foot; no heat is appreciated no swelling to leg NEUROLOGIC:  Cranial nerves 2-12 grossly intact; left hemiparesis PSYCHIATRIC: Mood and affect appropriate to situation, no behavioral issues  Patient Active Problem List   Diagnosis Date Noted   Dyslipidemia associated with type 2 diabetes mellitus ([place]) 02/01/2019   Type 2 diabetes mellitus with neurological complications ([place]) 65/79/0383   Osteomyelitis of great toe of right foot ([place]) 02/01/2019   Amputation of right great toe ([place]) 02/01/2019   Anemia associated with type 2 diabetes mellitus ([place]) 02/01/2019   Diabetic foot ulcer ([place]) 01/17/2019   Anemia 01/17/2019   Diarrhea 01/16/2019   Ileus ([place]) 10/19/2016   History of CVA (cerebrovascular accident) 10/19/2016   Nephrolithiasis 10/19/2016   Lytic bone lesions on xray 10/19/2016   Chronic anticoagulation 10/19/2016   TIA (transient ischemic attack) 02/25/2011   CVA (cerebral vascular accident) ([place]) 02/23/2011   Type 2 diabetes  mellitus ([place]) 02/23/2011   Hypertension associated with type 2 diabetes mellitus ([place]) 02/23/2011   Hyperlipidemia 02/23/2011      Labs reviewed: Basic Metabolic Panel:    Component Value Date/Time   NA 136 01/29/2019 0539   K 4.4 01/29/2019 0539   CL 106 01/29/2019 0539   CO2 23 01/29/2019 0539   GLUCOSE 109 (H) 01/29/2019 0539   BUN 41 (H) 01/29/2019 0539   CREATININE 1.16 01/29/2019 0539   CALCIUM 8.7 (L) 01/29/2019 0539   PROT 8.1 10/19/2016 1420   ALBUMIN 4.3 10/19/2016 1420   AST 25 10/19/2016 1420   ALT 17 10/19/2016 1420   ALKPHOS 52 10/19/2016 1420   BILITOT 0.9 10/19/2016 1420   GFRNONAA >60 01/29/2019 0539   GFRAA >60 01/29/2019 0539    Recent Labs    01/27/19 0347 01/28/19 0317 01/29/19 0539  NA 139 138 136  K 4.2 4.0 4.4  CL 106 105 106  CO2 _0 GLUCOSE 120* 123* 109*  BUN 34* 34* 41*  CREATININE 1.16 1.06 1.16  CALCIUM 8.7* 8.7* 8.7*   Liver Function Tests: No results for input(s): AST, ALT, ALKPHOS, BILITOT, PROT, ALBUMIN in the last 8760 hours. No results for input(s): LIPASE, AMYLASE in the last 8760 hours. No results for input(s): AMMONIA in the last 8760 hours. CBC: Recent Labs    01/19/19 0507 01/20/19 0604 01/21/19 0611  01/27/19 0347 01/28/19 0317 01/29/19 0539  WBC 13.2* 10.0 9.3   < > 9.8 10.8* 11.1*  NEUTROABS 9.5* 6.2 5.3  --   --   --   --   HGB 11.4* 11.0* 11.8*   < > 11.4* 11.4* 11.9*  HCT 36.2* 35.0* 37.2*   < > 35.0* 34.6* 36.3*  MCV 92.3 90.7 91.6   < > 89.5 89.9 89.4  PLT 344 345 350   < > 300 283 280   < > = values in this interval not displayed.   Lipid No results for input(s): CHOL, HDL, LDLCALC, TRIG in the last 8760 hours.  Cardiac Enzymes: No results for input(s): CKTOTAL, CKMB, CKMBINDEX, TROPONINI in the last 8760 hours. BNP: No results for input(s): BNP in the last 8760 hours. No results found for: [facility] Lab Results  Component Value Date   HGBA1C 6.3 (H) 01/18/2019   Lab Results   Component Value Date   TSH 1.074 02/25/2011   No results found for: VITAMINB12 No results found for: FOLATE No results found for: IRON, TIBC, FERRITIN  Imaging and Procedures obtained prior to SNF admission: No results found.   Not all labs, radiology exams or other studies done during hospitalization come through on my EPIC note; however they are reviewed by me.    Assessment and Plan  Right great toe osteomyelitis/poorly healing diabetic foot ulcer- patient was treated with antibiotics and anything hospital then transferred to [facility] for vascular surgery intervention.  Patient had a normal vascular work-up and great toe was amputated on 8/13 with clean margins.  Patient's cultures remain negative and after discussions with ID it was decided that patient did not need any further antibiotics SNF-admitted for OT/PT  Diabetes mellitus type 2 initially with some hypoglycemia and glipizide was decreased SNF- continue glipizide 2.5 mg twice daily metformin 1000 mg twice daily; patient is on ARB  Hyperlipidemia SNF- not stated as uncontrolled; continue Lipitor 20 mg daily  Hypertension SNF-controlled; continue Norvasc 2.5 mg daily, losartan 100 mg daily and Toprol-XL 12.5 mg daily  Chronic DVT/status post CVA SNF- continue Eliquis 5 mg twice daily; stable    Time spent greater than 45 minutes;> 50% of time with patient was spent reviewing records, labs, tests and studies, counseling and developing plan of care   Hennie Duos, MD

## 2019-02-01 DIAGNOSIS — S98111A Complete traumatic amputation of right great toe, initial encounter: Secondary | ICD-10-CM | POA: Insufficient documentation

## 2019-02-01 DIAGNOSIS — M869 Osteomyelitis, unspecified: Secondary | ICD-10-CM | POA: Insufficient documentation

## 2019-02-01 DIAGNOSIS — E1149 Type 2 diabetes mellitus with other diabetic neurological complication: Secondary | ICD-10-CM | POA: Insufficient documentation

## 2019-02-01 DIAGNOSIS — D638 Anemia in other chronic diseases classified elsewhere: Secondary | ICD-10-CM | POA: Insufficient documentation

## 2019-02-01 DIAGNOSIS — E1169 Type 2 diabetes mellitus with other specified complication: Secondary | ICD-10-CM | POA: Insufficient documentation

## 2019-02-03 ENCOUNTER — Encounter: Payer: Self-pay | Admitting: Internal Medicine

## 2019-02-03 DIAGNOSIS — I82509 Chronic embolism and thrombosis of unspecified deep veins of unspecified lower extremity: Secondary | ICD-10-CM | POA: Insufficient documentation

## 2019-02-06 ENCOUNTER — Non-Acute Institutional Stay (SKILLED_NURSING_FACILITY): Payer: Medicare Other | Admitting: Adult Health

## 2019-02-06 ENCOUNTER — Encounter: Payer: Self-pay | Admitting: Adult Health

## 2019-02-06 DIAGNOSIS — I639 Cerebral infarction, unspecified: Secondary | ICD-10-CM

## 2019-02-06 DIAGNOSIS — I10 Essential (primary) hypertension: Secondary | ICD-10-CM

## 2019-02-06 DIAGNOSIS — E785 Hyperlipidemia, unspecified: Secondary | ICD-10-CM | POA: Diagnosis not present

## 2019-02-06 DIAGNOSIS — E1169 Type 2 diabetes mellitus with other specified complication: Secondary | ICD-10-CM

## 2019-02-06 DIAGNOSIS — E1159 Type 2 diabetes mellitus with other circulatory complications: Secondary | ICD-10-CM

## 2019-02-06 NOTE — Progress Notes (Signed)
Location:    [place] Room Number: 156/W Place of Service:  SNF (31)   CODE STATUS: DNR  No Known Allergies  Chief Complaint  Patient presents with  . Medical Management of Chronic Issues        Hypertension associated with type 2 diabetes mellitus: Cerebrovascular accident unspecified mechanism   Dyslipidemia associated with type 2 diabetes mellitus  Weekly follow up for the first 30 days post hospitalization.     HPI:  He is a 73 year old short term rehab patient being seen for the management of his chronic illnesses: hypertension; cva; dyslipidemia. He denies any uncontrolled pain; has a good appetite; no insomnia; no anxiety   Past Medical History:  Diagnosis Date  . Diabetes mellitus   . DVT (deep venous thrombosis) ([place])   . Foot ulcer due to secondary DM ([place])   . High triglycerides   . Hypertension   . PVD (peripheral vascular disease) ([place])   . Renal disorder   . 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 AMPUTATION Right 01/25/2019   Procedure: AMPUTATION RIGHT GREAT TOE;  Surgeon: Serafina Mitchell, MD;  Location: [facility] OR;  Service: Vascular;  Laterality: Right;  . COLONOSCOPY  2009   [place] in [place]: diverticulosis, next TCS in 07/2017  . CYSTOSCOPY WITH RETROGRADE PYELOGRAM, URETEROSCOPY AND STENT PLACEMENT Right 11/19/2016   Procedure: CYSTOSCOPY WITH RETROGRADE PYELOGRAM, URETEROSCOPY AND STENT EXCHANGE;  Surgeon: Alexis Frock, MD;  Location: [facility] ORS;  Service: Urology;  Laterality: Right;  . CYSTOSCOPY WITH STENT PLACEMENT Right 10/19/2016   Procedure: CYSTOSCOPY WITH STENT PLACEMENT;  Surgeon: Alexis Frock, MD;  Location: [facility] ORS;  Service: Urology;  Laterality: Right;  . ENDARTERECTOMY     Left popliteal artery endarterectomy with left above-knee popliteal artery to tibial peroneal trunk bypass with reverse saphenous vein  . HERNIA REPAIR     bilateral inguinal  .  HOLMIUM LASER APPLICATION Right 02/15/7168   Procedure: HOLMIUM LASER APPLICATION;  Surgeon: Alexis Frock, MD;  Location: [facility] ORS;  Service: Urology;  Laterality: Right;  . TOE AMPUTATION    . TONSILLECTOMY      Social History   Socioeconomic History  . Marital status: Married    Spouse name: Not on file  . Number of children: Not on file  . Years of education: Not on file  . Highest education level: Not on file  Occupational History  . Occupation: disabled  Social Needs  . Financial resource strain: Not on file  . Food insecurity    Worry: Not on file    Inability: Not on file  . Transportation needs    Medical: Not on file    Non-medical: Not on file  Tobacco Use  . Smoking status: Never Smoker  . Smokeless tobacco: Never Used  Substance and Sexual Activity  . Alcohol use: No  . Drug use: No  . Sexual activity: Not on file  Lifestyle  . Physical activity    Days per week: Not on file    Minutes per session: Not on file  . Stress: Not on file  Relationships  . Social Herbalist on phone: Not on file    Gets together: Not on file    Attends religious service: Not on file    Active member of club or organization: Not on file    Attends meetings of clubs or organizations:  Not on file    Relationship status: Not on file  . Intimate partner violence    Fear of current or ex partner: Not on file    Emotionally abused: Not on file    Physically abused: Not on file    Forced sexual activity: Not on file  Other Topics Concern  . Not on file  Social History Narrative  . Not on file   Family History  Problem Relation Age of Onset  . Multiple myeloma Mother 42  . Colon cancer Neg Hx       VITAL SIGNS BP 132/80   Pulse 64   Temp 98.3 F (36.8 C) (Oral)   Resp 18   Ht 5' 10" (1.778 m)   Wt 158 lb 1.6 oz (71.7 kg)   BMI 22.68 kg/m   Outpatient Encounter Medications as of 02/06/2019  Medication Sig  . amLODipine (NORVASC) 2.5 MG tablet Take 1  tablet (2.5 mg total) by mouth daily.  Marland Kitchen apixaban (ELIQUIS) 5 MG TABS tablet Take 5 mg by mouth 2 (two) times daily.  Marland Kitchen atorvastatin (LIPITOR) 20 MG tablet Take 20 mg by mouth every morning.   Marland Kitchen b complex vitamins tablet Take 1 tablet by mouth 2 (two) times daily.  . feeding supplement, ENSURE ENLIVE, (ENSURE ENLIVE) LIQD Take 237 mLs by mouth 2 (two) times daily between meals. Due to insufficient meal intake  . glipiZIDE (GLUCOTROL) 5 MG tablet Take 0.5 tablets (2.5 mg total) by mouth 2 (two) times daily before a meal.  . Lactobacillus (ACIDOPHILUS) 0.5 MG TABS Take 2 tablets by mouth 2 (two) times daily.   Marland Kitchen loperamide (LOPERAMIDE A-D) 2 MG tablet Take 2 mg by mouth daily as needed for diarrhea or loose stools.   Marland Kitchen losartan (COZAAR) 100 MG tablet Take 100 mg by mouth daily.  . Melatonin 3 MG TABS Take 3 mg by mouth at bedtime.  . metFORMIN (GLUCOPHAGE) 1000 MG tablet Take 1,000 mg by mouth 2 (two) times daily with a meal.  . metoprolol succinate (TOPROL-XL) 12.5 mg TB24 24 hr tablet Take 12.5 mg by mouth daily.  . NON FORMULARY Diet Type: Regular, NAS, Consistent Carbohydrate  . Polyvinyl Alcohol-Povidone PF 1.4-0.6 % SOLN Place 1 drop into both eyes 2 (two) times daily. For dry eyes   No facility-administered encounter medications on file as of 02/06/2019.      SIGNIFICANT DIAGNOSTIC EXAMS  PREVIOUS;   01-17-19: right foot x-ray:  1. Soft tissue swelling and ulceration at the tip of the first digit. Few punctate radiodensities within the soft tissues are new from prior exam and could reflect debris or foreign body and should be correlated with visual inspection. 2. No radiographic evidence of osteomyelitis. MRI is more sensitive and specific for early findings of osteomyelitis.  01-18-19: right foot MRI:  Motion degraded examination demonstrates marrow edema throughout the distal phalanx of the great toe consistent with osteomyelitis. No abscess is identified. No evidence of septic joint.   01-18-19: bilateral ABI: Normal bilateral ABIs. Vascular calcification and resultant limited compressibility in the setting of diabetes can result in elevated values however. Right 1.28 left 1.16  NO NEW EXAMS.   LABS REVIEWED PREVIOUS:   01-17-19: wbc 11.8; hgb 11.7; hct 36.0; mcv 89.8; plt 364; glucose 181; bun 37; creat 1.21; k+ 4.4; na++ 133; ca 8.9; blood culture: no growth; pre-albumin 21.4; CRP 1.8 sed rate 52 01-18-19: wbc 10.9; hct 10.7; hct 33.1; mcv 91.2; plt 308; hgb a1c 6.3 01-20-19: GI  panel: neg 01-23-19: wbc 10.0; hgb 12.0; hct 36.3; mcv 89.4; plt 349; glucose 111; bun 37; creat 1.32; k+ 4.3; na++ 138; ca 9.0 01-29-19: wbc 11.1; bgt 11.9; hct 36.3; mcv 89.4; plt 280; gluocose 109; bun 41; creat 1.16; k+ 4.4; na++ 136; ca 8.7   NO NEW LABS.   Review of Systems  Constitutional: Negative for malaise/fatigue.  Respiratory: Negative for cough and shortness of breath.   Cardiovascular: Negative for chest pain, palpitations and leg swelling.  Gastrointestinal: Negative for abdominal pain, constipation and heartburn.  Musculoskeletal: Negative for back pain, joint pain and myalgias.  Skin: Negative.   Neurological: Negative for dizziness.  Psychiatric/Behavioral: The patient is not nervous/anxious.      Physical Exam Constitutional:      General: He is not in acute distress.    Appearance: He is well-developed. He is not diaphoretic.  Neck:     Musculoskeletal: Neck supple.     Thyroid: No thyromegaly.  Cardiovascular:     Rate and Rhythm: Normal rate and regular rhythm.     Pulses: Normal pulses.     Heart sounds: Normal heart sounds.  Pulmonary:     Effort: Pulmonary effort is normal. No respiratory distress.     Breath sounds: Normal breath sounds.  Abdominal:     General: Bowel sounds are normal. There is no distension.     Palpations: Abdomen is soft.     Tenderness: There is no abdominal tenderness.  Musculoskeletal:     Right lower leg: No edema.     Left lower  leg: No edema.     Comments: Left hemiplegia   Lymphadenopathy:     Cervical: No cervical adenopathy.  Skin:    General: Skin is warm and dry.     Comments: Right great toe sutures intact without signs of infection present   Neurological:     Mental Status: He is alert and oriented to person, place, and time.  Psychiatric:        Mood and Affect: Mood normal.        ASSESSMENT/ PLAN:  TODAY:   1. Hypertension associated with type 2 diabetes mellitus: is stable b/p 132/80 will continue norvasc 2.5 mg daily toprolxl 12.5 mg daily and cozaar 100 mg daily   2. Cerebrovascular accident unspecified mechanism has left hemiplegia: is stable will continue eliquis 5 mg twice daily is on statin   3. Dyslipidemia associated with type 2 diabetes mellitus: is stable will continue lipitor 20 mg daily    PREVIOUS   4. 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 He is not requiring use of narcotic pain medication at this time.   6. 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 We will attempt to wean resident as apropriate   Ok Edwards NP [facility]  Contact [phone] Monday through Friday 8am- 5pm  After hours call [phone]

## 2019-02-07 ENCOUNTER — Encounter (HOSPITAL_COMMUNITY)
Admission: RE | Admit: 2019-02-07 | Discharge: 2019-02-07 | Disposition: A | Discharge status: Home / Self Care | Payer: No Typology Code available for payment source | Source: Ambulatory Visit | Attending: Adult Health | Admitting: Adult Health

## 2019-02-07 DIAGNOSIS — E11621 Type 2 diabetes mellitus with foot ulcer: Secondary | ICD-10-CM | POA: Insufficient documentation

## 2019-02-07 DIAGNOSIS — Z20828 Contact with and (suspected) exposure to other viral communicable diseases: Secondary | ICD-10-CM | POA: Insufficient documentation

## 2019-02-07 LAB — BASIC METABOLIC PANEL
Anion gap: 9 (ref 5–15)
BUN: 29 mg/dL — ABNORMAL HIGH (ref 8–23)
CO2: 22 mmol/L (ref 22–32)
Calcium: 9 mg/dL (ref 8.9–10.3)
Chloride: 106 mmol/L (ref 98–111)
Creatinine, Ser: 0.99 mg/dL (ref 0.61–1.24)
GFR calc Af Amer: >60 mL/min (ref >60)
GFR calc non Af Amer: >60 mL/min (ref >60)
Glucose, Bld: 98 mg/dL (ref 70–99)
Potassium: 4.1 mmol/L (ref 3.5–5.1)
Sodium: 137 mmol/L (ref 135–145)

## 2019-02-07 LAB — CBC
HCT: 37 % — ABNORMAL LOW (ref 39.0–52.0)
Hemoglobin: 11.9 g/dL — ABNORMAL LOW (ref 13.0–17.0)
MCH: 29.2 pg (ref 26.0–34.0)
MCHC: 32.2 g/dL (ref 30.0–36.0)
MCV: 90.9 fL (ref 80.0–100.0)
Platelets: 245 10*3/uL (ref 150–400)
RBC: 4.07 MIL/uL — ABNORMAL LOW (ref 4.22–5.81)
RDW: 13.4 % (ref 11.5–15.5)
WBC: 8.9 10*3/uL (ref 4.0–10.5)
nRBC: 0 % (ref 0.0–0.2)

## 2019-02-08 ENCOUNTER — Encounter (HOSPITAL_COMMUNITY)
Admission: RE | Admit: 2019-02-08 | Discharge: 2019-02-08 | Disposition: A | Discharge status: Home / Self Care | Payer: No Typology Code available for payment source | Source: Ambulatory Visit | Attending: *Deleted | Admitting: *Deleted

## 2019-02-08 LAB — SARS CORONAVIRUS 2 (TAT 6-24 HRS): SARS Coronavirus 2: NEGATIVE

## 2019-02-09 NOTE — Progress Notes (Signed)
  POST OPERATIVE OFFICE NOTE    CC:  F/u for surgery  HPI:  This is a 73 y.o. male who is s/p right great toe amputation on 01/25/2019 by Dr. Trula Slade.  On POD 1, he was doing well and wound looked good.  He was discharged home.   He returns today for follow up.    He states he is at the [facility] in [place] for rehab.  He states they have been taking good care of him.  He states that his toe amp site is healing.  He denies any fevers.    He is on Eliquis.  No Known Allergies  Outpatient Encounter Medications as of 02/12/2019  Medication Sig  . amLODipine (NORVASC) 2.5 MG tablet Take 1 tablet (2.5 mg total) by mouth daily.  Marland Kitchen apixaban (ELIQUIS) 5 MG TABS tablet Take 5 mg by mouth 2 (two) times daily.  Marland Kitchen atorvastatin (LIPITOR) 20 MG tablet Take 20 mg by mouth every morning.   Marland Kitchen b complex vitamins tablet Take 1 tablet by mouth 2 (two) times daily.  . feeding supplement, ENSURE ENLIVE, (ENSURE ENLIVE) LIQD Take 237 mLs by mouth 2 (two) times daily between meals. Due to insufficient meal intake  . glipiZIDE (GLUCOTROL) 5 MG tablet Take 0.5 tablets (2.5 mg total) by mouth 2 (two) times daily before a meal.  . Lactobacillus (ACIDOPHILUS) 0.5 MG TABS Take 2 tablets by mouth 2 (two) times daily.   Marland Kitchen loperamide (LOPERAMIDE A-D) 2 MG tablet Take 2 mg by mouth daily as needed for diarrhea or loose stools.   Marland Kitchen losartan (COZAAR) 100 MG tablet Take 100 mg by mouth daily.  . Melatonin 3 MG TABS Take 3 mg by mouth at bedtime.  . metFORMIN (GLUCOPHAGE) 1000 MG tablet Take 1,000 mg by mouth 2 (two) times daily with a meal.  . metoprolol succinate (TOPROL-XL) 12.5 mg TB24 24 hr tablet Take 12.5 mg by mouth daily.  . NON FORMULARY Diet Type: Regular, NAS, Consistent Carbohydrate  . Polyvinyl Alcohol-Povidone PF 1.4-0.6 % SOLN Place 1 drop into both eyes 2 (two) times daily. For dry eyes   No facility-administered encounter medications on file as of 02/12/2019.     ROS:  See HPI  Physical Exam:   Today's Vitals   02/12/19 1433  BP: 121/72  Pulse: 70  Resp: 20  Temp: 98.3 F (36.8 C)  TempSrc: Temporal  SpO2: 99%   There is no height or weight on file to calculate BMI.   Incision:      Extremities:  Easily palpable right DP/PT doppler signals; well healed toe amp site.    Assessment/Plan:  This is a 73 y.o. male who is s/p: right great toe amputation on 01/25/2019 by Dr. Trula Slade.  -pt doing well from amputation.  Will remove his stitches today.   -discussed with Dr. Trula Slade about scab on plantar surface and he feels this will peel off and be fine.   -he does have easily palpable right DP/PT pulses. -he will f/u in 6 months with carotid duplex given he had some mild disease 8 years ago.   -he will call sooner should he have any issues.    Leontine Locket, PA-C [facility] [phone]  Clinic MD:  Trula Slade

## 2019-02-12 ENCOUNTER — Ambulatory Visit (INDEPENDENT_AMBULATORY_CARE_PROVIDER_SITE_OTHER): Payer: Self-pay | Admitting: Physician Assistant

## 2019-02-12 ENCOUNTER — Encounter: Payer: Self-pay | Admitting: Physician Assistant

## 2019-02-12 VITALS — BP 121/72 | HR 70 | Temp 98.3°F | Resp 20

## 2019-02-12 DIAGNOSIS — S98111A Complete traumatic amputation of right great toe, initial encounter: Secondary | ICD-10-CM

## 2019-02-13 ENCOUNTER — Encounter: Payer: Self-pay | Admitting: Adult Health

## 2019-02-13 ENCOUNTER — Non-Acute Institutional Stay (SKILLED_NURSING_FACILITY): Payer: Medicare Other | Admitting: Adult Health

## 2019-02-13 DIAGNOSIS — M869 Osteomyelitis, unspecified: Secondary | ICD-10-CM

## 2019-02-13 DIAGNOSIS — E1149 Type 2 diabetes mellitus with other diabetic neurological complication: Secondary | ICD-10-CM

## 2019-02-13 DIAGNOSIS — D638 Anemia in other chronic diseases classified elsewhere: Secondary | ICD-10-CM | POA: Diagnosis not present

## 2019-02-13 DIAGNOSIS — E1169 Type 2 diabetes mellitus with other specified complication: Secondary | ICD-10-CM

## 2019-02-13 NOTE — Progress Notes (Signed)
Location:    [place] Room Number: 134/P Place of Service:  SNF (31)   CODE STATUS: DNR  No Known Allergies  Chief Complaint  Patient presents with  . 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     HPI:  He is a 73 year old short term rehab patient being seen for the management of his chronic illnesses: diabetes; osteomyelitis; anemia. He continues to participate in therapy. He denies any uncontrolled pain; no weakness or fatigue present.   Past Medical History:  Diagnosis Date  . Diabetes mellitus   . DVT (deep venous thrombosis) ([place])   . Foot ulcer due to secondary DM ([place])   . High triglycerides   . Hypertension   . PVD (peripheral vascular disease) ([place])   . Renal disorder   . 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 AMPUTATION Right 01/25/2019   Procedure: AMPUTATION RIGHT GREAT TOE;  Surgeon: Serafina Mitchell, MD;  Location: [facility] OR;  Service: Vascular;  Laterality: Right;  . COLONOSCOPY  2009   [place] in [place]: diverticulosis, next TCS in 07/2017  . CYSTOSCOPY WITH RETROGRADE PYELOGRAM, URETEROSCOPY AND STENT PLACEMENT Right 11/19/2016   Procedure: CYSTOSCOPY WITH RETROGRADE PYELOGRAM, URETEROSCOPY AND STENT EXCHANGE;  Surgeon: Alexis Frock, MD;  Location: [facility]S;  Service: Urology;  Laterality: Right;  . CYSTOSCOPY WITH STENT PLACEMENT Right 10/19/2016   Procedure: CYSTOSCOPY WITH STENT PLACEMENT;  Surgeon: Alexis Frock, MD;  Location: [facility] ORS;  Service: Urology;  Laterality: Right;  . ENDARTERECTOMY     Left popliteal artery endarterectomy with left above-knee popliteal artery to tibial peroneal trunk bypass with reverse saphenous  vein  . HERNIA REPAIR     bilateral inguinal  . HOLMIUM LASER APPLICATION Right 06/20/107   Procedure: HOLMIUM LASER APPLICATION;  Surgeon: Alexis Frock, MD;  Location: [facility] ORS;  Service: Urology;  Laterality: Right;  . TOE AMPUTATION    . TONSILLECTOMY      Social History   Socioeconomic History  . Marital status: Married    Spouse name: Not on file  . Number of children: Not on file  . Years of education: Not on file  . Highest education level: Not on file  Occupational History  . Occupation: disabled  Social Needs  . Financial resource strain: Not on file  . Food insecurity    Worry: Not on file    Inability: Not on file  . Transportation needs    Medical: Not on file    Non-medical: Not on file  Tobacco Use  . Smoking status: Never Smoker  . Smokeless tobacco: Never Used  Substance and Sexual Activity  . Alcohol use: No  . Drug use: No  . Sexual activity: Not on file  Lifestyle  . Physical activity    Days per week: Not on file    Minutes per session: Not on file  . Stress: Not on file  Relationships  . Social Herbalist on phone: Not on file    Gets together: Not on file    Attends religious service: Not on file    Active member of club or organization: Not  on file    Attends meetings of clubs or organizations: Not on file    Relationship status: Not on file  . Intimate partner violence    Fear of current or ex partner: Not on file    Emotionally abused: Not on file    Physically abused: Not on file    Forced sexual activity: Not on file  Other Topics Concern  . Not on file  Social History Narrative  . Not on file   Family History  Problem Relation Age of Onset  . Multiple myeloma Mother 106  . Colon cancer Neg Hx       VITAL SIGNS BP 135/72   Pulse 67   Temp (!) 97 F (36.1 C) (Oral)   Resp 20   Ht _0  (1.778 m)   Wt 158 lb 9.6 oz (71.9 kg)   BMI 22.76 kg/m     Outpatient Encounter Medications as of 02/13/2019  Medication  Sig  . b complex vitamins tablet Take 1 tablet by mouth 2 (two) times daily.  . feeding supplement, ENSURE ENLIVE, (ENSURE ENLIVE) LIQD Take 237 mLs by mouth 2 (two) times daily between meals. Due to insufficient meal intake  . Lactobacillus (ACIDOPHILUS) 0.5 MG TABS Take 2 tablets by mouth 2 (two) times daily.   Marland Kitchen loperamide (LOPERAMIDE A-D) 2 MG tablet Take 2 mg by mouth daily as needed for diarrhea or loose stools.   . Melatonin 3 MG TABS Take 3 mg by mouth at bedtime.  . NON FORMULARY Diet Type: Regular, NAS, Consistent Carbohydrate  . Polyvinyl Alcohol-Povidone PF 1.4-0.6 % SOLN Place 1 drop into both eyes 2 (two) times daily. For dry eyes  . ] amLODipine (NORVASC) 2.5 MG tablet Take 1 tablet (2.5 mg total) by mouth daily.  . _1  apixaban (ELIQUIS) 5 MG TABS tablet Take 5 mg by mouth 2 (two) times daily.  Marland Kitchen  atorvastatin (LIPITOR) 20 MG tablet Take 20 mg by mouth every morning.   Marland Kitchen  glipiZIDE (GLUCOTROL) 5 MG tablet Take 0.5 tablets (2.5 mg total) by mouth 2 (two) times daily before a meal.  .  losartan (COZAAR) 100 MG tablet Take 100 mg by mouth daily.  .  metFORMIN (GLUCOPHAGE) 1000 MG tablet Take 1,000 mg by mouth 2 (two) times daily with a meal.  .  metoprolol succinate (TOPROL-XL) 12.5 mg TB24 24 hr tablet Take 12.5 mg by mouth daily.   No facility-administered encounter medications on file as of 02/13/2019.      SIGNIFICANT DIAGNOSTIC EXAMS   PREVIOUS;   01-17-19: right foot x-ray:  1. Soft tissue swelling and ulceration at the tip of the first digit. Few punctate radiodensities within the soft tissues are new from prior exam and could reflect debris or foreign body and should be correlated with visual inspection. 2. No radiographic evidence of osteomyelitis. MRI is more sensitive and specific for early findings of osteomyelitis.  01-18-19: right foot MRI:  Motion degraded examination demonstrates marrow edema throughout the distal phalanx of the great toe consistent with  osteomyelitis. No abscess is identified. No evidence of septic joint.  01-18-19: bilateral ABI: Normal bilateral ABIs. Vascular calcification and resultant limited compressibility in the setting of diabetes can result in elevated values however. Right 1.28 left 1.16  NO NEW EXAMS.   LABS REVIEWED PREVIOUS:   01-17-19: wbc 11.8; hgb 11.7; hct 36.0; mcv 89.8; plt 364; glucose 181; bun 37; creat 1.21; k+ 4.4; na++ 133; ca 8.9; blood culture: no growth; pre-albumin  21.4; CRP 1.8 sed rate 52 01-18-19: wbc 10.9; hct 10.7; hct 33.1; mcv 91.2; plt 308; hgb a1c 6.3 01-20-19: GI panel: neg 01-23-19: wbc 10.0; hgb 12.0; hct 36.3; mcv 89.4; plt 349; glucose 111; bun 37; creat 1.32; k+ 4.3; na++ 138; ca 9.0 01-29-19: wbc 11.1; bgt 11.9; hct 36.3; mcv 89.4; plt 280; gluocose 109; bun 41; creat 1.16; k+ 4.4; na++ 136; ca 8.7   NO NEW LABS.   Review of Systems  Constitutional: Negative for malaise/fatigue.  Respiratory: Negative for cough and shortness of breath.   Cardiovascular: Negative for chest pain, palpitations and leg swelling.  Gastrointestinal: Negative for abdominal pain, constipation and heartburn.  Musculoskeletal: Negative for back pain, joint pain and myalgias.  Skin: Negative.   Neurological: Negative for dizziness.  Psychiatric/Behavioral: The patient is not nervous/anxious.     Physical Exam Constitutional:      General: He is not in acute distress.    Appearance: He is well-developed. He is not diaphoretic.  Neck:     Musculoskeletal: Neck supple.     Thyroid: No thyromegaly.  Cardiovascular:     Rate and Rhythm: Normal rate and regular rhythm.     Pulses: Normal pulses.     Heart sounds: Normal heart sounds.  Pulmonary:     Effort: Pulmonary effort is normal. No respiratory distress.     Breath sounds: Normal breath sounds.  Abdominal:     General: Bowel sounds are normal. There is no distension.     Palpations: Abdomen is soft.     Tenderness: There is no abdominal tenderness.   Musculoskeletal:     Right lower leg: No edema.     Left lower leg: No edema.     Comments: Left heimplegia  Lymphadenopathy:     Cervical: No cervical adenopathy.  Skin:    General: Skin is warm and dry.     Comments: Right great toe without signs of infection present   Neurological:     Mental Status: He is alert and oriented to person, place, and time.  Psychiatric:        Mood and Affect: Mood normal.      ASSESSMENT/ PLAN:  TODAY:   1. 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 Osteomyelitis of great toe of right foot/amputation of right great toe: is stable will continue therapy as directed and will follow up with surgeon;   3. 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 Hypertension associated with type 2 diabetes mellitus: is stable b/p 135/72 will continue norvasc 2.5 mg daily toprolxl 12.5 mg daily and cozaar 100 mg daily   5. Cerebrovascular accident unspecified mechanism has left hemiplegia: is stable will continue eliquis 5 mg twice daily is on statin   6. Dyslipidemia associated with type 2 diabetes mellitus: is stable will continue lipitor 20 mg daily          MD is aware of resident's narcotic use and is in agreement with current plan of care. We will attempt to wean resident as appropriate.  Ok Edwards NP [facility]  Contact [phone] Monday through Friday 8am- 5pm  After hours call [phone]

## 2019-02-14 ENCOUNTER — Non-Acute Institutional Stay (SKILLED_NURSING_FACILITY): Payer: Medicare Other | Admitting: Adult Health

## 2019-02-14 ENCOUNTER — Encounter: Payer: Self-pay | Admitting: Adult Health

## 2019-02-14 DIAGNOSIS — M869 Osteomyelitis, unspecified: Secondary | ICD-10-CM

## 2019-02-14 DIAGNOSIS — S98111A Complete traumatic amputation of right great toe, initial encounter: Secondary | ICD-10-CM

## 2019-02-14 DIAGNOSIS — I825Y9 Chronic embolism and thrombosis of unspecified deep veins of unspecified proximal lower extremity: Secondary | ICD-10-CM | POA: Diagnosis not present

## 2019-02-14 NOTE — Progress Notes (Signed)
Location:    [facility] Room Number: 134/P Place of Service:  SNF (31)   CODE STATUS: DNR  No Known Allergies  Chief Complaint  Patient presents with  . Acute Visit    Care Plan Meeting,     HPI:  We have come together for his care plan meeting. He continues to participate in therapy. He does live ina split level house and at tis time is unable to go up steps. He requires 2 max assist. He lives with his wife and children. His goal is to go home within the next week. No reports of falls; his weight is stable. He denies any uncontrolled pain.   Past Medical History:  Diagnosis Date  . Diabetes mellitus   . DVT (deep venous thrombosis) ([place])   . Foot ulcer due to secondary DM ([place])   . High triglycerides   . Hypertension   . PVD (peripheral vascular disease) ([place])   . Renal disorder   . Stroke [facility]) 2008   left hemiparesis    Past Surgical History:  Procedure Laterality Date  . AMPUTATION Right 01/25/2019   Procedure: AMPUTATION RIGHT GREAT TOE;  Surgeon: Serafina Mitchell, MD;  Location: [facility] OR;  Service: Vascular;  Laterality: Right;  . COLONOSCOPY  2009   [place] in [place]: diverticulosis, next TCS in 07/2017  . CYSTOSCOPY WITH RETROGRADE PYELOGRAM, URETEROSCOPY AND STENT PLACEMENT Right 11/19/2016   Procedure: CYSTOSCOPY WITH RETROGRADE PYELOGRAM, URETEROSCOPY AND STENT EXCHANGE;  Surgeon: Alexis Frock, MD;  Location: [facility] ORS;  Service: Urology;  Laterality: Right;  . CYSTOSCOPY WITH STENT PLACEMENT Right 10/19/2016   Procedure: CYSTOSCOPY WITH STENT PLACEMENT;  Surgeon: Alexis Frock, MD;  Location: [facility] ORS;  Service: Urology;  Laterality: Right;  . ENDARTERECTOMY     Left popliteal artery endarterectomy with left above-knee popliteal artery to tibial peroneal trunk bypass with reverse saphenous vein  . HERNIA REPAIR     bilateral inguinal  . HOLMIUM LASER APPLICATION Right 09/13/6382   Procedure: HOLMIUM LASER APPLICATION;  Surgeon:  Alexis Frock, MD;  Location: [facility] ORS;  Service: Urology;  Laterality: Right;  . TOE AMPUTATION    . TONSILLECTOMY      Social History   Socioeconomic History  . Marital status: Married    Spouse name: Not on file  . Number of children: Not on file  . Years of education: Not on file  . Highest education level: Not on file  Occupational History  . Occupation: disabled  Social Needs  . Financial resource strain: Not on file  . Food insecurity    Worry: Not on file    Inability: Not on file  . Transportation needs    Medical: Not on file    Non-medical: Not on file  Tobacco Use  . Smoking status: Never Smoker  . Smokeless tobacco: Never Used  Substance and Sexual Activity  . Alcohol use: No  . Drug use: No  . Sexual activity: Not on file  Lifestyle  . Physical activity    Days per week: Not on file    Minutes per session: Not on file  . Stress: Not on file  Relationships  . Social Herbalist on phone: Not on file    Gets together: Not on file    Attends religious service: Not on file    Active member of club or organization: Not on file    Attends meetings of clubs or organizations: Not on file  Relationship status: Not on file  . Intimate partner violence    Fear of current or ex partner: Not on file    Emotionally abused: Not on file    Physically abused: Not on file    Forced sexual activity: Not on file  Other Topics Concern  . Not on file  Social History Narrative  . Not on file   Family History  Problem Relation Age of Onset  . Multiple myeloma Mother 43  . Colon cancer Neg Hx       VITAL SIGNS BP 114/67   Pulse 70   Temp 97.8 F (36.6 C) (Oral)   Resp 18   Ht _0  (1.778 m)   Wt 158 lb 9.6 oz (71.9 kg)   BMI 22.76 kg/m   Outpatient Encounter Medications as of 02/14/2019  Medication Sig  . amLODipine (NORVASC) 2.5 MG tablet Take 1 tablet (2.5 mg total) by mouth daily.  Marland Kitchen apixaban (ELIQUIS) 5 MG TABS tablet Take 5 mg by mouth 2  (two) times daily.  Marland Kitchen atorvastatin (LIPITOR) 20 MG tablet Take 20 mg by mouth every morning.   Marland Kitchen b complex vitamins tablet Take 1 tablet by mouth 2 (two) times daily.  . feeding supplement, ENSURE ENLIVE, (ENSURE ENLIVE) LIQD Take 237 mLs by mouth 2 (two) times daily between meals. Due to insufficient meal intake  . glipiZIDE (GLUCOTROL) 5 MG tablet Take 0.5 tablets (2.5 mg total) by mouth 2 (two) times daily before a meal.  . Lactobacillus (ACIDOPHILUS) 0.5 MG TABS Take 2 tablets by mouth 2 (two) times daily.   Marland Kitchen loperamide (LOPERAMIDE A-D) 2 MG tablet Take 2 mg by mouth daily as needed for diarrhea or loose stools.   Marland Kitchen losartan (COZAAR) 100 MG tablet Take 100 mg by mouth daily.  . Melatonin 3 MG TABS Take 3 mg by mouth at bedtime.  . metFORMIN (GLUCOPHAGE) 1000 MG tablet Take 1,000 mg by mouth 2 (two) times daily with a meal.  . metoprolol succinate (TOPROL-XL) 12.5 mg TB24 24 hr tablet Take 12.5 mg by mouth daily.  . NON FORMULARY Diet Type: Regular, NAS, Consistent Carbohydrate  . Polyvinyl Alcohol-Povidone PF 1.4-0.6 % SOLN Place 1 drop into both eyes 2 (two) times daily. For dry eyes   No facility-administered encounter medications on file as of 02/14/2019.      SIGNIFICANT DIAGNOSTIC EXAMS   PREVIOUS;   01-17-19: right foot x-ray:  1. Soft tissue swelling and ulceration at the tip of the first digit. Few punctate radiodensities within the soft tissues are new from prior exam and could reflect debris or foreign body and should be correlated with visual inspection. 2. No radiographic evidence of osteomyelitis. MRI is more sensitive and specific for early findings of osteomyelitis.  01-18-19: right foot MRI:  Motion degraded examination demonstrates marrow edema throughout the distal phalanx of the great toe consistent with osteomyelitis. No abscess is identified. No evidence of septic joint.  01-18-19: bilateral ABI: Normal bilateral ABIs. Vascular calcification and resultant limited  compressibility in the setting of diabetes can result in elevated values however. Right 1.28 left 1.16  NO NEW EXAMS.   LABS REVIEWED PREVIOUS:   01-17-19: wbc 11.8; hgb 11.7; hct 36.0; mcv 89.8; plt 364; glucose 181; bun 37; creat 1.21; k+ 4.4; na++ 133; ca 8.9; blood culture: no growth; pre-albumin 21.4; CRP 1.8 sed rate 52 01-18-19: wbc 10.9; hct 10.7; hct 33.1; mcv 91.2; plt 308; hgb a1c 6.3 01-20-19: GI panel: neg 01-23-19: wbc 10.0;  hgb 12.0; hct 36.3; mcv 89.4; plt 349; glucose 111; bun 37; creat 1.32; k+ 4.3; na++ 138; ca 9.0 01-29-19: wbc 11.1; bgt 11.9; hct 36.3; mcv 89.4; plt 280; gluocose 109; bun 41; creat 1.16; k+ 4.4; na++ 136; ca 8.7   NO NEW LABS.   Review of Systems  Constitutional: Negative for malaise/fatigue.  Respiratory: Negative for cough and shortness of breath.   Cardiovascular: Negative for chest pain, palpitations and leg swelling.  Gastrointestinal: Negative for abdominal pain, constipation and heartburn.  Musculoskeletal: Negative for back pain, joint pain and myalgias.  Skin: Negative.   Neurological: Negative for dizziness.  Psychiatric/Behavioral: The patient is not nervous/anxious.     Physical Exam Constitutional:      General: He is not in acute distress.    Appearance: He is well-developed. He is not diaphoretic.  Neck:     Musculoskeletal: Neck supple.     Thyroid: No thyromegaly.  Cardiovascular:     Rate and Rhythm: Normal rate and regular rhythm.     Pulses: Normal pulses.     Heart sounds: Normal heart sounds.  Pulmonary:     Effort: Pulmonary effort is normal. No respiratory distress.     Breath sounds: Normal breath sounds.  Abdominal:     General: Bowel sounds are normal. There is no distension.     Palpations: Abdomen is soft.     Tenderness: There is no abdominal tenderness.  Musculoskeletal:     Right lower leg: No edema.     Left lower leg: No edema.     Comments: Left hemiplegia   Lymphadenopathy:     Cervical: No cervical  adenopathy.  Skin:    General: Skin is warm and dry.     Comments: Right great toe without signs of infection present    Neurological:     Mental Status: He is alert and oriented to person, place, and time.  Psychiatric:        Mood and Affect: Mood normal.      ASSESSMENT/ PLAN:  TODAY  1. Osteomyelitis of great toe of right foot 2. Amputation of right great toe 3. Chronic deep vein thrombosis (DVT) of proximal vein of lower extremity unspecified laterality  Will continue current plan of care Will continue therapy as directed Will continue to monitor his status.      MD is aware of resident's narcotic use and is in agreement with current plan of care. We will attempt to wean resident as appropriate.  Ok Edwards NP [facility]  Contact [phone] Monday through Friday 8am- 5pm  After hours call [phone]

## 2019-02-15 ENCOUNTER — Non-Acute Institutional Stay (SKILLED_NURSING_FACILITY): Payer: Medicare Other | Admitting: Adult Health

## 2019-02-15 ENCOUNTER — Other Ambulatory Visit: Payer: Self-pay | Admitting: Adult Health

## 2019-02-15 ENCOUNTER — Encounter: Payer: Self-pay | Admitting: Adult Health

## 2019-02-15 DIAGNOSIS — M869 Osteomyelitis, unspecified: Secondary | ICD-10-CM

## 2019-02-15 DIAGNOSIS — S98111A Complete traumatic amputation of right great toe, initial encounter: Secondary | ICD-10-CM | POA: Diagnosis not present

## 2019-02-15 DIAGNOSIS — E1149 Type 2 diabetes mellitus with other diabetic neurological complication: Secondary | ICD-10-CM | POA: Diagnosis not present

## 2019-02-15 DIAGNOSIS — I10 Essential (primary) hypertension: Secondary | ICD-10-CM

## 2019-02-15 DIAGNOSIS — E1159 Type 2 diabetes mellitus with other circulatory complications: Secondary | ICD-10-CM | POA: Diagnosis not present

## 2019-02-15 MED ORDER — LOSARTAN POTASSIUM 100 MG PO TABS: 100.0000 mg | ORAL_TABLET | Freq: Every day | ORAL | 0 refills | Status: DC

## 2019-02-15 MED ORDER — METFORMIN HCL 1000 MG PO TABS: 1000.0000 mg | ORAL_TABLET | Freq: Two times a day (BID) | ORAL | 0 refills | Status: DC

## 2019-02-15 MED ORDER — METOPROLOL SUCCINATE ER 25 MG PO TB24: 12.5000 mg | ORAL_TABLET | Freq: Every day | ORAL | 0 refills | Status: AC

## 2019-02-15 MED ORDER — APIXABAN 5 MG PO TABS: 5.0000 mg | ORAL_TABLET | Freq: Two times a day (BID) | ORAL | 0 refills | Status: AC

## 2019-02-15 MED ORDER — AMLODIPINE BESYLATE 2.5 MG PO TABS: 2.5000 mg | ORAL_TABLET | Freq: Every day | ORAL | 0 refills | Status: DC

## 2019-02-15 MED ORDER — GLIPIZIDE 5 MG PO TABS: 2.5000 mg | ORAL_TABLET | Freq: Two times a day (BID) | ORAL | 0 refills | Status: DC

## 2019-02-15 MED ORDER — ATORVASTATIN CALCIUM 20 MG PO TABS: 20.0000 mg | ORAL_TABLET | Freq: Every morning | ORAL | 0 refills | Status: AC

## 2019-02-15 NOTE — Progress Notes (Signed)
Location:    [place] Room Number: 134/P Place of Service:  SNF (31)    CODE STATUS: DNR  No Known Allergies  Chief Complaint  Patient presents with  . Discharge Note    Discharge Visit    HPI:   He is being discharged to home with home health for pt/ot/sw. He will not need any dme. He will need his prescriptions written and will need to follow up with his medical provider. He had been hospitalized for a right great toe amputation due to osteomyelitis. He was admitted to this facility for short term rehab. He did participate in both physical and occupational therapy. He is now ready to complete his therapy on a home health basis.    Past Medical History:  Diagnosis Date  . Diabetes mellitus   . DVT (deep venous thrombosis) ([place])   . Foot ulcer due to secondary DM ([place])   . High triglycerides   . Hypertension   . PVD (peripheral vascular disease) ([place])   . Renal disorder   . Stroke [facility]) 2008   left hemiparesis    Past Surgical History:  Procedure Laterality Date  . AMPUTATION Right 01/25/2019   Procedure: AMPUTATION RIGHT GREAT TOE;  Surgeon: Serafina Mitchell, MD;  Location[facility] OR;  Service: Vascular;  Laterality: Right;  . COLONOSCOPY  2009   [place] in [place]: diverticulosis, next TCS in 07/2017  . CYSTOSCOPY WITH RETROGRADE PYELOGRAM, URETEROSCOPY AND STENT PLACEMENT Right 11/19/2016   Procedure: CYSTOSCOPY WITH RETROGRADE PYELOGRAM, URETEROSCOPY AND STENT EXCHANGE;  Surgeon: Alexis Frock, MD;  Location: [facility] ORS;  Service: Urology;  Laterality: Right;  . CYSTOSCOPY WITH STENT PLACEMENT Right 10/19/2016   Procedure: CYSTOSCOPY WITH STENT PLACEMENT;  Surgeon: Alexis Frock, MD;  Location: [facility] ORS;  Service: Urology;  Laterality: Right;  . ENDARTERECTOMY     Left popliteal artery endarterectomy with left above-knee popliteal artery to tibial peroneal trunk bypass with reverse saphenous vein  . HERNIA REPAIR     bilateral inguinal   . HOLMIUM LASER APPLICATION Right 0/07/6376   Procedure: HOLMIUM LASER APPLICATION;  Surgeon: Alexis Frock, MD;  Location: [facility] ORS;  Service: Urology;  Laterality: Right;  . TOE AMPUTATION    . TONSILLECTOMY      Social History   Socioeconomic History  . Marital status: Married    Spouse name: Not on file  . Number of children: Not on file  . Years of education: Not on file  . Highest education level: Not on file  Occupational History  . Occupation: disabled  Social Needs  . Financial resource strain: Not on file  . Food insecurity    Worry: Not on file    Inability: Not on file  . Transportation needs    Medical: Not on file    Non-medical: Not on file  Tobacco Use  . Smoking status: Never Smoker  . Smokeless tobacco: Never Used  Substance and Sexual Activity  . Alcohol use: No  . Drug use: No  . Sexual activity: Not on file  Lifestyle  . Physical activity    Days per week: Not on file    Minutes per session: Not on file  . Stress: Not on file  Relationships  . Social Herbalist on phone: Not on file    Gets together: Not on file    Attends religious service: Not on file    Active member of club or organization: Not on file  Attends meetings of clubs or organizations: Not on file    Relationship status: Not on file  . Intimate partner violence    Fear of current or ex partner: Not on file    Emotionally abused: Not on file    Physically abused: Not on file    Forced sexual activity: Not on file  Other Topics Concern  . Not on file  Social History Narrative  . Not on file   Family History  Problem Relation Age of Onset  . Multiple myeloma Mother 5  . Colon cancer Neg Hx     VITAL SIGNS BP 114/67   Pulse 70   Temp (!) 97.5 F (36.4 C) (Oral)   Resp 18   Ht '5\' 10"'  (1.778 m)   Wt 158 lb 9.6 oz (71.9 kg)   BMI 22.76 kg/m   Patient's Medications  New Prescriptions   No medications on file  Previous Medications   AMLODIPINE (NORVASC)  2.5 MG TABLET    Take 1 tablet (2.5 mg total) by mouth daily.   APIXABAN (ELIQUIS) 5 MG TABS TABLET    Take 5 mg by mouth 2 (two) times daily.   ATORVASTATIN (LIPITOR) 20 MG TABLET    Take 20 mg by mouth every morning.    B COMPLEX VITAMINS TABLET    Take 1 tablet by mouth 2 (two) times daily.   FEEDING SUPPLEMENT, ENSURE ENLIVE, (ENSURE ENLIVE) LIQD    Take 237 mLs by mouth 2 (two) times daily between meals. Due to insufficient meal intake   GLIPIZIDE (GLUCOTROL) 5 MG TABLET    Take 0.5 tablets (2.5 mg total) by mouth 2 (two) times daily before a meal.   LACTOBACILLUS (ACIDOPHILUS) 0.5 MG TABS    Take 2 tablets by mouth 2 (two) times daily.    LOPERAMIDE (LOPERAMIDE A-D) 2 MG TABLET    Take 2 mg by mouth daily as needed for diarrhea or loose stools.    LOSARTAN (COZAAR) 100 MG TABLET    Take 100 mg by mouth daily.   MELATONIN 3 MG TABS    Take 3 mg by mouth at bedtime.   METFORMIN (GLUCOPHAGE) 1000 MG TABLET    Take 1,000 mg by mouth 2 (two) times daily with a meal.   METOPROLOL SUCCINATE (TOPROL-XL) 12.5 MG TB24 24 HR TABLET    Take 12.5 mg by mouth daily.   NON FORMULARY    Diet Type: Regular, NAS, Consistent Carbohydrate   POLYVINYL ALCOHOL-POVIDONE PF 1.4-0.6 % SOLN    Place 1 drop into both eyes 2 (two) times daily. For dry eyes  Modified Medications   No medications on file  Discontinued Medications   No medications on file     SIGNIFICANT DIAGNOSTIC EXAMS   PREVIOUS;   01-17-19: right foot x-ray:  1. Soft tissue swelling and ulceration at the tip of the first digit. Few punctate radiodensities within the soft tissues are new from prior exam and could reflect debris or foreign body and should be correlated with visual inspection. 2. No radiographic evidence of osteomyelitis. MRI is more sensitive and specific for early findings of osteomyelitis.  01-18-19: right foot MRI:  Motion degraded examination demonstrates marrow edema throughout the distal phalanx of the great toe consistent  with osteomyelitis. No abscess is identified. No evidence of septic joint.  01-18-19: bilateral ABI: Normal bilateral ABIs. Vascular calcification and resultant limited compressibility in the setting of diabetes can result in elevated values however. Right 1.28 left 1.16  NO  NEW EXAMS.   LABS REVIEWED PREVIOUS:   01-17-19: wbc 11.8; hgb 11.7; hct 36.0; mcv 89.8; plt 364; glucose 181; bun 37; creat 1.21; k+ 4.4; na++ 133; ca 8.9; blood culture: no growth; pre-albumin 21.4; CRP 1.8 sed rate 52 01-18-19: wbc 10.9; hct 10.7; hct 33.1; mcv 91.2; plt 308; hgb a1c 6.3 01-20-19: GI panel: neg 01-23-19: wbc 10.0; hgb 12.0; hct 36.3; mcv 89.4; plt 349; glucose 111; bun 37; creat 1.32; k+ 4.3; na++ 138; ca 9.0 01-29-19: wbc 11.1; bgt 11.9; hct 36.3; mcv 89.4; plt 280; gluocose 109; bun 41; creat 1.16; k+ 4.4; na++ 136; ca 8.7   NO NEW LABS.   Review of Systems  Constitutional: Negative for malaise/fatigue.  Respiratory: Negative for cough and shortness of breath.   Cardiovascular: Negative for chest pain, palpitations and leg swelling.  Gastrointestinal: Negative for abdominal pain, constipation and heartburn.  Musculoskeletal: Negative for back pain, joint pain and myalgias.  Skin: Negative.   Neurological: Negative for dizziness.  Psychiatric/Behavioral: The patient is not nervous/anxious.     Physical Exam Constitutional:      General: He is not in acute distress.    Appearance: He is well-developed. He is not diaphoretic.  Neck:     Musculoskeletal: Neck supple.     Thyroid: No thyromegaly.  Cardiovascular:     Rate and Rhythm: Normal rate and regular rhythm.     Pulses: Normal pulses.     Heart sounds: Normal heart sounds.  Pulmonary:     Effort: Pulmonary effort is normal. No respiratory distress.     Breath sounds: Normal breath sounds.  Abdominal:     General: Bowel sounds are normal. There is no distension.     Palpations: Abdomen is soft.     Tenderness: There is no abdominal  tenderness.  Musculoskeletal:     Right lower leg: No edema.     Left lower leg: No edema.     Comments: Left hemiplegia  Lymphadenopathy:     Cervical: No cervical adenopathy.  Skin:    General: Skin is warm and dry.     Comments: Right great toe without signs of infection present   Neurological:     Mental Status: He is alert and oriented to person, place, and time.  Psychiatric:        Mood and Affect: Mood normal.      ASSESSMENT/ PLAN:  Patient is being discharged with the following home health services:  Pt/ot/sw: to evaluate and treat as indicated for gait balance strength adl training and community resources.   Patient is being discharged with the following durable medical equipment:  None needed   Patient has been advised to f/u with their PCP in 1-2 weeks to bring them up to date on their rehab stay.  Social services at facility was responsible for arranging this appointment.  Pt was provided with a 30 day supply of prescriptions for medications and refills must be obtained from their PCP.  For controlled substances, a more limited supply may be provided adequate until PCP appointment only.  A 30 day supply of his prescription medications have been sent to [facility] in [place]  Time spent with patient: 35 minutes for home health needs; dme; medications.    Ok Edwards NP [facility] Adult Medicine  Contact [phone] Monday through Friday 8am- 5pm  After hours call [phone]

## 2019-02-20 DIAGNOSIS — I10 Essential (primary) hypertension: Secondary | ICD-10-CM | POA: Diagnosis not present

## 2019-02-20 DIAGNOSIS — I69354 Hemiplegia and hemiparesis following cerebral infarction affecting left non-dominant side: Secondary | ICD-10-CM | POA: Diagnosis not present

## 2019-02-20 DIAGNOSIS — D649 Anemia, unspecified: Secondary | ICD-10-CM | POA: Diagnosis not present

## 2019-02-20 DIAGNOSIS — E1151 Type 2 diabetes mellitus with diabetic peripheral angiopathy without gangrene: Secondary | ICD-10-CM | POA: Diagnosis not present

## 2019-02-20 DIAGNOSIS — Z4781 Encounter for orthopedic aftercare following surgical amputation: Secondary | ICD-10-CM | POA: Diagnosis not present

## 2019-02-23 DIAGNOSIS — E1151 Type 2 diabetes mellitus with diabetic peripheral angiopathy without gangrene: Secondary | ICD-10-CM | POA: Diagnosis not present

## 2019-02-23 DIAGNOSIS — I69354 Hemiplegia and hemiparesis following cerebral infarction affecting left non-dominant side: Secondary | ICD-10-CM | POA: Diagnosis not present

## 2019-02-23 DIAGNOSIS — Z4781 Encounter for orthopedic aftercare following surgical amputation: Secondary | ICD-10-CM | POA: Diagnosis not present

## 2019-02-23 DIAGNOSIS — I10 Essential (primary) hypertension: Secondary | ICD-10-CM | POA: Diagnosis not present

## 2019-02-23 DIAGNOSIS — D649 Anemia, unspecified: Secondary | ICD-10-CM | POA: Diagnosis not present

## 2019-02-26 DIAGNOSIS — Z4781 Encounter for orthopedic aftercare following surgical amputation: Secondary | ICD-10-CM | POA: Diagnosis not present

## 2019-02-26 DIAGNOSIS — I69354 Hemiplegia and hemiparesis following cerebral infarction affecting left non-dominant side: Secondary | ICD-10-CM | POA: Diagnosis not present

## 2019-02-26 DIAGNOSIS — D649 Anemia, unspecified: Secondary | ICD-10-CM | POA: Diagnosis not present

## 2019-02-26 DIAGNOSIS — I10 Essential (primary) hypertension: Secondary | ICD-10-CM | POA: Diagnosis not present

## 2019-02-26 DIAGNOSIS — E1151 Type 2 diabetes mellitus with diabetic peripheral angiopathy without gangrene: Secondary | ICD-10-CM | POA: Diagnosis not present

## 2019-02-28 DIAGNOSIS — I10 Essential (primary) hypertension: Secondary | ICD-10-CM | POA: Diagnosis not present

## 2019-02-28 DIAGNOSIS — E1151 Type 2 diabetes mellitus with diabetic peripheral angiopathy without gangrene: Secondary | ICD-10-CM | POA: Diagnosis not present

## 2019-02-28 DIAGNOSIS — I69354 Hemiplegia and hemiparesis following cerebral infarction affecting left non-dominant side: Secondary | ICD-10-CM | POA: Diagnosis not present

## 2019-02-28 DIAGNOSIS — Z4781 Encounter for orthopedic aftercare following surgical amputation: Secondary | ICD-10-CM | POA: Diagnosis not present

## 2019-02-28 DIAGNOSIS — D649 Anemia, unspecified: Secondary | ICD-10-CM | POA: Diagnosis not present

## 2019-03-02 DIAGNOSIS — Z4781 Encounter for orthopedic aftercare following surgical amputation: Secondary | ICD-10-CM | POA: Diagnosis not present

## 2019-03-02 DIAGNOSIS — I10 Essential (primary) hypertension: Secondary | ICD-10-CM | POA: Diagnosis not present

## 2019-03-02 DIAGNOSIS — E1151 Type 2 diabetes mellitus with diabetic peripheral angiopathy without gangrene: Secondary | ICD-10-CM | POA: Diagnosis not present

## 2019-03-02 DIAGNOSIS — D649 Anemia, unspecified: Secondary | ICD-10-CM | POA: Diagnosis not present

## 2019-03-02 DIAGNOSIS — I69354 Hemiplegia and hemiparesis following cerebral infarction affecting left non-dominant side: Secondary | ICD-10-CM | POA: Diagnosis not present

## 2019-03-05 DIAGNOSIS — Z4781 Encounter for orthopedic aftercare following surgical amputation: Secondary | ICD-10-CM | POA: Diagnosis not present

## 2019-03-05 DIAGNOSIS — D649 Anemia, unspecified: Secondary | ICD-10-CM | POA: Diagnosis not present

## 2019-03-05 DIAGNOSIS — E1151 Type 2 diabetes mellitus with diabetic peripheral angiopathy without gangrene: Secondary | ICD-10-CM | POA: Diagnosis not present

## 2019-03-05 DIAGNOSIS — I69354 Hemiplegia and hemiparesis following cerebral infarction affecting left non-dominant side: Secondary | ICD-10-CM | POA: Diagnosis not present

## 2019-03-05 DIAGNOSIS — I10 Essential (primary) hypertension: Secondary | ICD-10-CM | POA: Diagnosis not present

## 2019-03-07 DIAGNOSIS — E1151 Type 2 diabetes mellitus with diabetic peripheral angiopathy without gangrene: Secondary | ICD-10-CM | POA: Diagnosis not present

## 2019-03-07 DIAGNOSIS — D649 Anemia, unspecified: Secondary | ICD-10-CM | POA: Diagnosis not present

## 2019-03-07 DIAGNOSIS — Z4781 Encounter for orthopedic aftercare following surgical amputation: Secondary | ICD-10-CM | POA: Diagnosis not present

## 2019-03-07 DIAGNOSIS — I10 Essential (primary) hypertension: Secondary | ICD-10-CM | POA: Diagnosis not present

## 2019-03-07 DIAGNOSIS — I69354 Hemiplegia and hemiparesis following cerebral infarction affecting left non-dominant side: Secondary | ICD-10-CM | POA: Diagnosis not present

## 2019-03-12 DIAGNOSIS — Z4781 Encounter for orthopedic aftercare following surgical amputation: Secondary | ICD-10-CM | POA: Diagnosis not present

## 2019-03-12 DIAGNOSIS — I69354 Hemiplegia and hemiparesis following cerebral infarction affecting left non-dominant side: Secondary | ICD-10-CM | POA: Diagnosis not present

## 2019-03-12 DIAGNOSIS — I10 Essential (primary) hypertension: Secondary | ICD-10-CM | POA: Diagnosis not present

## 2019-03-12 DIAGNOSIS — E1151 Type 2 diabetes mellitus with diabetic peripheral angiopathy without gangrene: Secondary | ICD-10-CM | POA: Diagnosis not present

## 2019-03-12 DIAGNOSIS — D649 Anemia, unspecified: Secondary | ICD-10-CM | POA: Diagnosis not present

## 2019-03-14 DIAGNOSIS — Z4781 Encounter for orthopedic aftercare following surgical amputation: Secondary | ICD-10-CM | POA: Diagnosis not present

## 2019-03-14 DIAGNOSIS — E1151 Type 2 diabetes mellitus with diabetic peripheral angiopathy without gangrene: Secondary | ICD-10-CM | POA: Diagnosis not present

## 2019-03-14 DIAGNOSIS — I69354 Hemiplegia and hemiparesis following cerebral infarction affecting left non-dominant side: Secondary | ICD-10-CM | POA: Diagnosis not present

## 2019-03-14 DIAGNOSIS — I10 Essential (primary) hypertension: Secondary | ICD-10-CM | POA: Diagnosis not present

## 2019-03-14 DIAGNOSIS — D649 Anemia, unspecified: Secondary | ICD-10-CM | POA: Diagnosis not present

## 2019-03-21 DIAGNOSIS — E1151 Type 2 diabetes mellitus with diabetic peripheral angiopathy without gangrene: Secondary | ICD-10-CM | POA: Diagnosis not present

## 2019-03-21 DIAGNOSIS — Z4781 Encounter for orthopedic aftercare following surgical amputation: Secondary | ICD-10-CM | POA: Diagnosis not present

## 2019-03-21 DIAGNOSIS — D649 Anemia, unspecified: Secondary | ICD-10-CM | POA: Diagnosis not present

## 2019-03-21 DIAGNOSIS — I10 Essential (primary) hypertension: Secondary | ICD-10-CM | POA: Diagnosis not present

## 2019-03-21 DIAGNOSIS — I69354 Hemiplegia and hemiparesis following cerebral infarction affecting left non-dominant side: Secondary | ICD-10-CM | POA: Diagnosis not present

## 2019-03-28 DIAGNOSIS — I69354 Hemiplegia and hemiparesis following cerebral infarction affecting left non-dominant side: Secondary | ICD-10-CM | POA: Diagnosis not present

## 2019-03-28 DIAGNOSIS — E1151 Type 2 diabetes mellitus with diabetic peripheral angiopathy without gangrene: Secondary | ICD-10-CM | POA: Diagnosis not present

## 2019-03-28 DIAGNOSIS — D649 Anemia, unspecified: Secondary | ICD-10-CM | POA: Diagnosis not present

## 2019-03-28 DIAGNOSIS — Z4781 Encounter for orthopedic aftercare following surgical amputation: Secondary | ICD-10-CM | POA: Diagnosis not present

## 2019-03-28 DIAGNOSIS — I10 Essential (primary) hypertension: Secondary | ICD-10-CM | POA: Diagnosis not present

## 2019-04-04 DIAGNOSIS — Z4781 Encounter for orthopedic aftercare following surgical amputation: Secondary | ICD-10-CM | POA: Diagnosis not present

## 2019-04-04 DIAGNOSIS — I10 Essential (primary) hypertension: Secondary | ICD-10-CM | POA: Diagnosis not present

## 2019-04-04 DIAGNOSIS — E1151 Type 2 diabetes mellitus with diabetic peripheral angiopathy without gangrene: Secondary | ICD-10-CM | POA: Diagnosis not present

## 2019-04-04 DIAGNOSIS — I69354 Hemiplegia and hemiparesis following cerebral infarction affecting left non-dominant side: Secondary | ICD-10-CM | POA: Diagnosis not present

## 2019-04-04 DIAGNOSIS — D649 Anemia, unspecified: Secondary | ICD-10-CM | POA: Diagnosis not present

## 2019-04-11 DIAGNOSIS — D649 Anemia, unspecified: Secondary | ICD-10-CM | POA: Diagnosis not present

## 2019-04-11 DIAGNOSIS — E1151 Type 2 diabetes mellitus with diabetic peripheral angiopathy without gangrene: Secondary | ICD-10-CM | POA: Diagnosis not present

## 2019-04-11 DIAGNOSIS — Z4781 Encounter for orthopedic aftercare following surgical amputation: Secondary | ICD-10-CM | POA: Diagnosis not present

## 2019-04-11 DIAGNOSIS — I69354 Hemiplegia and hemiparesis following cerebral infarction affecting left non-dominant side: Secondary | ICD-10-CM | POA: Diagnosis not present

## 2019-04-11 DIAGNOSIS — I10 Essential (primary) hypertension: Secondary | ICD-10-CM | POA: Diagnosis not present

## 2019-04-18 DIAGNOSIS — D649 Anemia, unspecified: Secondary | ICD-10-CM | POA: Diagnosis not present

## 2019-04-18 DIAGNOSIS — E1169 Type 2 diabetes mellitus with other specified complication: Secondary | ICD-10-CM | POA: Diagnosis not present

## 2019-04-18 DIAGNOSIS — I69354 Hemiplegia and hemiparesis following cerebral infarction affecting left non-dominant side: Secondary | ICD-10-CM | POA: Diagnosis not present

## 2019-04-18 DIAGNOSIS — I10 Essential (primary) hypertension: Secondary | ICD-10-CM | POA: Diagnosis not present

## 2019-04-18 DIAGNOSIS — E1151 Type 2 diabetes mellitus with diabetic peripheral angiopathy without gangrene: Secondary | ICD-10-CM | POA: Diagnosis not present

## 2019-04-25 DIAGNOSIS — D649 Anemia, unspecified: Secondary | ICD-10-CM | POA: Diagnosis not present

## 2019-04-25 DIAGNOSIS — I10 Essential (primary) hypertension: Secondary | ICD-10-CM | POA: Diagnosis not present

## 2019-04-25 DIAGNOSIS — E1151 Type 2 diabetes mellitus with diabetic peripheral angiopathy without gangrene: Secondary | ICD-10-CM | POA: Diagnosis not present

## 2019-04-25 DIAGNOSIS — E1169 Type 2 diabetes mellitus with other specified complication: Secondary | ICD-10-CM | POA: Diagnosis not present

## 2019-04-25 DIAGNOSIS — I69354 Hemiplegia and hemiparesis following cerebral infarction affecting left non-dominant side: Secondary | ICD-10-CM | POA: Diagnosis not present

## 2019-04-27 DIAGNOSIS — E1151 Type 2 diabetes mellitus with diabetic peripheral angiopathy without gangrene: Secondary | ICD-10-CM | POA: Diagnosis not present

## 2019-04-27 DIAGNOSIS — E1169 Type 2 diabetes mellitus with other specified complication: Secondary | ICD-10-CM | POA: Diagnosis not present

## 2019-04-27 DIAGNOSIS — D649 Anemia, unspecified: Secondary | ICD-10-CM | POA: Diagnosis not present

## 2019-04-27 DIAGNOSIS — I69354 Hemiplegia and hemiparesis following cerebral infarction affecting left non-dominant side: Secondary | ICD-10-CM | POA: Diagnosis not present

## 2019-04-27 DIAGNOSIS — I10 Essential (primary) hypertension: Secondary | ICD-10-CM | POA: Diagnosis not present

## 2019-04-30 DIAGNOSIS — I69354 Hemiplegia and hemiparesis following cerebral infarction affecting left non-dominant side: Secondary | ICD-10-CM | POA: Diagnosis not present

## 2019-04-30 DIAGNOSIS — E1169 Type 2 diabetes mellitus with other specified complication: Secondary | ICD-10-CM | POA: Diagnosis not present

## 2019-04-30 DIAGNOSIS — E1151 Type 2 diabetes mellitus with diabetic peripheral angiopathy without gangrene: Secondary | ICD-10-CM | POA: Diagnosis not present

## 2019-04-30 DIAGNOSIS — I10 Essential (primary) hypertension: Secondary | ICD-10-CM | POA: Diagnosis not present

## 2019-04-30 DIAGNOSIS — D649 Anemia, unspecified: Secondary | ICD-10-CM | POA: Diagnosis not present

## 2019-05-02 DIAGNOSIS — I10 Essential (primary) hypertension: Secondary | ICD-10-CM | POA: Diagnosis not present

## 2019-05-02 DIAGNOSIS — E1169 Type 2 diabetes mellitus with other specified complication: Secondary | ICD-10-CM | POA: Diagnosis not present

## 2019-05-02 DIAGNOSIS — D649 Anemia, unspecified: Secondary | ICD-10-CM | POA: Diagnosis not present

## 2019-05-02 DIAGNOSIS — I69354 Hemiplegia and hemiparesis following cerebral infarction affecting left non-dominant side: Secondary | ICD-10-CM | POA: Diagnosis not present

## 2019-05-02 DIAGNOSIS — E1151 Type 2 diabetes mellitus with diabetic peripheral angiopathy without gangrene: Secondary | ICD-10-CM | POA: Diagnosis not present

## 2019-05-08 DIAGNOSIS — E1151 Type 2 diabetes mellitus with diabetic peripheral angiopathy without gangrene: Secondary | ICD-10-CM | POA: Diagnosis not present

## 2019-05-08 DIAGNOSIS — D649 Anemia, unspecified: Secondary | ICD-10-CM | POA: Diagnosis not present

## 2019-05-08 DIAGNOSIS — I10 Essential (primary) hypertension: Secondary | ICD-10-CM | POA: Diagnosis not present

## 2019-05-08 DIAGNOSIS — I69354 Hemiplegia and hemiparesis following cerebral infarction affecting left non-dominant side: Secondary | ICD-10-CM | POA: Diagnosis not present

## 2019-05-08 DIAGNOSIS — E1169 Type 2 diabetes mellitus with other specified complication: Secondary | ICD-10-CM | POA: Diagnosis not present

## 2019-05-17 DIAGNOSIS — E1169 Type 2 diabetes mellitus with other specified complication: Secondary | ICD-10-CM | POA: Diagnosis not present

## 2019-05-17 DIAGNOSIS — D649 Anemia, unspecified: Secondary | ICD-10-CM | POA: Diagnosis not present

## 2019-05-17 DIAGNOSIS — I10 Essential (primary) hypertension: Secondary | ICD-10-CM | POA: Diagnosis not present

## 2019-05-17 DIAGNOSIS — E1151 Type 2 diabetes mellitus with diabetic peripheral angiopathy without gangrene: Secondary | ICD-10-CM | POA: Diagnosis not present

## 2019-05-17 DIAGNOSIS — I69354 Hemiplegia and hemiparesis following cerebral infarction affecting left non-dominant side: Secondary | ICD-10-CM | POA: Diagnosis not present

## 2019-05-23 DIAGNOSIS — I69354 Hemiplegia and hemiparesis following cerebral infarction affecting left non-dominant side: Secondary | ICD-10-CM | POA: Diagnosis not present

## 2019-05-23 DIAGNOSIS — E1151 Type 2 diabetes mellitus with diabetic peripheral angiopathy without gangrene: Secondary | ICD-10-CM | POA: Diagnosis not present

## 2019-05-23 DIAGNOSIS — D649 Anemia, unspecified: Secondary | ICD-10-CM | POA: Diagnosis not present

## 2019-05-23 DIAGNOSIS — E1169 Type 2 diabetes mellitus with other specified complication: Secondary | ICD-10-CM | POA: Diagnosis not present

## 2019-05-23 DIAGNOSIS — I10 Essential (primary) hypertension: Secondary | ICD-10-CM | POA: Diagnosis not present

## 2019-05-29 DIAGNOSIS — I10 Essential (primary) hypertension: Secondary | ICD-10-CM | POA: Diagnosis not present

## 2019-05-29 DIAGNOSIS — E1151 Type 2 diabetes mellitus with diabetic peripheral angiopathy without gangrene: Secondary | ICD-10-CM | POA: Diagnosis not present

## 2019-05-29 DIAGNOSIS — D649 Anemia, unspecified: Secondary | ICD-10-CM | POA: Diagnosis not present

## 2019-05-29 DIAGNOSIS — E1169 Type 2 diabetes mellitus with other specified complication: Secondary | ICD-10-CM | POA: Diagnosis not present

## 2019-05-29 DIAGNOSIS — I69354 Hemiplegia and hemiparesis following cerebral infarction affecting left non-dominant side: Secondary | ICD-10-CM | POA: Diagnosis not present

## 2019-05-31 DIAGNOSIS — E1169 Type 2 diabetes mellitus with other specified complication: Secondary | ICD-10-CM | POA: Diagnosis not present

## 2019-05-31 DIAGNOSIS — I69354 Hemiplegia and hemiparesis following cerebral infarction affecting left non-dominant side: Secondary | ICD-10-CM | POA: Diagnosis not present

## 2019-05-31 DIAGNOSIS — I10 Essential (primary) hypertension: Secondary | ICD-10-CM | POA: Diagnosis not present

## 2019-05-31 DIAGNOSIS — E1151 Type 2 diabetes mellitus with diabetic peripheral angiopathy without gangrene: Secondary | ICD-10-CM | POA: Diagnosis not present

## 2019-05-31 DIAGNOSIS — D649 Anemia, unspecified: Secondary | ICD-10-CM | POA: Diagnosis not present

## 2019-06-05 DIAGNOSIS — E1151 Type 2 diabetes mellitus with diabetic peripheral angiopathy without gangrene: Secondary | ICD-10-CM | POA: Diagnosis not present

## 2019-06-05 DIAGNOSIS — I69354 Hemiplegia and hemiparesis following cerebral infarction affecting left non-dominant side: Secondary | ICD-10-CM | POA: Diagnosis not present

## 2019-06-05 DIAGNOSIS — D649 Anemia, unspecified: Secondary | ICD-10-CM | POA: Diagnosis not present

## 2019-06-05 DIAGNOSIS — I10 Essential (primary) hypertension: Secondary | ICD-10-CM | POA: Diagnosis not present

## 2019-06-05 DIAGNOSIS — E1169 Type 2 diabetes mellitus with other specified complication: Secondary | ICD-10-CM | POA: Diagnosis not present

## 2019-06-07 DIAGNOSIS — I10 Essential (primary) hypertension: Secondary | ICD-10-CM | POA: Diagnosis not present

## 2019-06-07 DIAGNOSIS — I69354 Hemiplegia and hemiparesis following cerebral infarction affecting left non-dominant side: Secondary | ICD-10-CM | POA: Diagnosis not present

## 2019-06-07 DIAGNOSIS — D649 Anemia, unspecified: Secondary | ICD-10-CM | POA: Diagnosis not present

## 2019-06-07 DIAGNOSIS — E1151 Type 2 diabetes mellitus with diabetic peripheral angiopathy without gangrene: Secondary | ICD-10-CM | POA: Diagnosis not present

## 2019-06-07 DIAGNOSIS — E1169 Type 2 diabetes mellitus with other specified complication: Secondary | ICD-10-CM | POA: Diagnosis not present

## 2019-06-12 DIAGNOSIS — E1151 Type 2 diabetes mellitus with diabetic peripheral angiopathy without gangrene: Secondary | ICD-10-CM | POA: Diagnosis not present

## 2019-06-12 DIAGNOSIS — I10 Essential (primary) hypertension: Secondary | ICD-10-CM | POA: Diagnosis not present

## 2019-06-12 DIAGNOSIS — D649 Anemia, unspecified: Secondary | ICD-10-CM | POA: Diagnosis not present

## 2019-06-12 DIAGNOSIS — E1169 Type 2 diabetes mellitus with other specified complication: Secondary | ICD-10-CM | POA: Diagnosis not present

## 2019-06-12 DIAGNOSIS — I69354 Hemiplegia and hemiparesis following cerebral infarction affecting left non-dominant side: Secondary | ICD-10-CM | POA: Diagnosis not present

## 2019-06-14 DIAGNOSIS — E1169 Type 2 diabetes mellitus with other specified complication: Secondary | ICD-10-CM | POA: Diagnosis not present

## 2019-06-14 DIAGNOSIS — I69354 Hemiplegia and hemiparesis following cerebral infarction affecting left non-dominant side: Secondary | ICD-10-CM | POA: Diagnosis not present

## 2019-06-14 DIAGNOSIS — D649 Anemia, unspecified: Secondary | ICD-10-CM | POA: Diagnosis not present

## 2019-06-14 DIAGNOSIS — E1151 Type 2 diabetes mellitus with diabetic peripheral angiopathy without gangrene: Secondary | ICD-10-CM | POA: Diagnosis not present

## 2019-06-14 DIAGNOSIS — I10 Essential (primary) hypertension: Secondary | ICD-10-CM | POA: Diagnosis not present

## 2020-01-14 ENCOUNTER — Encounter: Payer: Self-pay | Admitting: Internal Medicine

## 2020-02-12 ENCOUNTER — Ambulatory Visit: Payer: No Typology Code available for payment source | Admitting: Internal Medicine

## 2020-03-04 ENCOUNTER — Other Ambulatory Visit: Payer: Self-pay

## 2020-03-04 ENCOUNTER — Ambulatory Visit: Payer: No Typology Code available for payment source | Admitting: Internal Medicine

## 2020-03-04 ENCOUNTER — Ambulatory Visit (INDEPENDENT_AMBULATORY_CARE_PROVIDER_SITE_OTHER): Payer: No Typology Code available for payment source | Admitting: Internal Medicine

## 2020-03-04 VITALS — BP 131/76 | HR 67 | Temp 97.3°F | Ht 70.0 in

## 2020-03-04 DIAGNOSIS — Z1211 Encounter for screening for malignant neoplasm of colon: Secondary | ICD-10-CM

## 2020-03-04 DIAGNOSIS — R197 Diarrhea, unspecified: Secondary | ICD-10-CM

## 2020-03-04 NOTE — Progress Notes (Signed)
Primary Care Physician:  E[facility] Primary Gastroenterologist:  Dr. Gala Romney Pre-Procedure History & Physical: HPI:  Wesley Ingram is a 74 y.o. male here for here in referral from the [facility] for screening colonoscopy. Patient had a negative colonoscopy in 2009.  He has been referred for average risk screening.  He has no family history of colon cancer. Patient and wife do report chronic nonbloody diarrhea.  He takes Metformin.  In fact, he was seen here last year in preparation for a colonoscopy.  However, he was admitted to [facility] with osteomyelitis and underwent a right great toe amputation.  This derailed plans for colonoscopy.  Because of chronic diarrhea at that time stool studies were planned but never got done. Patient is history of a distant CVA which has left him with very poor balance.  History of DVT about 4 years ago for which she is chronically anticoagulated with Eliquis  Past Medical History:  Diagnosis Date  . Diabetes mellitus   . DVT (deep venous thrombosis) ([place])   . Foot ulcer due to secondary DM ([place])   . High triglycerides   . Hypertension   . PVD (peripheral vascular disease) ([place])   . Renal disorder   . Stroke [facility]) 2008   left hemiparesis    Past Surgical History:  Procedure Laterality Date  . AMPUTATION Right 01/25/2019   Procedure: AMPUTATION RIGHT GREAT TOE;  Surgeon: Serafina Mitchell, MD;  Location: [facility] OR;  Service: Vascular;  Laterality: Right;  . COLONOSCOPY  2009   [place] in [place]: diverticulosis, next TCS in 07/2017  . CYSTOSCOPY WITH RETROGRADE PYELOGRAM, URETEROSCOPY AND STENT PLACEMENT Right 11/19/2016   Procedure: CYSTOSCOPY WITH RETROGRADE PYELOGRAM, URETEROSCOPY AND STENT EXCHANGE;  Surgeon: Alexis Frock, MD;  Location: [facility] ORS;  Service: Urology;  Laterality: Right;  . CYSTOSCOPY WITH STENT PLACEMENT Right 10/19/2016   Procedure: CYSTOSCOPY WITH STENT PLACEMENT;  Surgeon: Alexis Frock, MD;   Location: [facility] ORS;  Service: Urology;  Laterality: Right;  . ENDARTERECTOMY     Left popliteal artery endarterectomy with left above-knee popliteal artery to tibial peroneal trunk bypass with reverse saphenous vein  . HERNIA REPAIR     bilateral inguinal  . HOLMIUM LASER APPLICATION Right 6/0/7371   Procedure: HOLMIUM LASER APPLICATION;  Surgeon: Alexis Frock, MD;  Location: [facility] ORS;  Service: Urology;  Laterality: Right;  . TOE AMPUTATION    . TONSILLECTOMY      Prior to Admission medications   Medication Sig Start Date End Date Taking? Authorizing Provider  Alogliptin Benzoate 25 MG TABS Take by mouth as needed. Hold Metformin when he takes it.   Yes [provider]  amLODipine (NORVASC) 2.5 MG tablet Take 1 tablet (2.5 mg total) by mouth daily. Patient taking differently: Take 5 mg by mouth daily.  02/15/19  Yes Gerlene Fee, NP  apixaban (ELIQUIS) 5 MG TABS tablet Take 1 tablet (5 mg total) by mouth 2 (two) times daily. 02/15/19  Yes Gerlene Fee, NP  atorvastatin (LIPITOR) 20 MG tablet Take 1 tablet (20 mg total) by mouth every morning. Patient taking differently: Take 10 mg by mouth daily.  02/15/19  Yes Gerlene Fee, NP  b complex vitamins tablet Take 1 tablet by mouth 2 (two) times daily.   Yes [provider]  glipiZIDE (GLUCOTROL) 5 MG tablet Take 0.5 tablets (2.5 mg total) by mouth 2 (two) times daily before a meal. 02/15/19  Yes Ok Edwards  S, NP  losartan (COZAAR) 100 MG tablet Take 1 tablet (100 mg total) by mouth daily. 02/15/19  Yes Gerlene Fee, NP  metFORMIN (GLUCOPHAGE) 1000 MG tablet Take 1 tablet (1,000 mg total) by mouth 2 (two) times daily with a meal. 02/15/19  Yes Gerlene Fee, NP  metoprolol succinate (TOPROL-XL) 25 MG 24 hr tablet Take 0.5 tablets (12.5 mg total) by mouth daily. Patient taking differently: Take 50 mg by mouth daily. Takes 1/2 tablet daily. 02/15/19  Yes Gerlene Fee, NP  Polyvinyl Alcohol-Povidone PF 1.4-0.6 % SOLN  Place 1 drop into both eyes 2 (two) times daily. For dry eyes   Yes [provider]  feeding supplement, ENSURE ENLIVE, (ENSURE ENLIVE) LIQD Take 237 mLs by mouth 2 (two) times daily between meals. Due to insufficient meal intake Patient not taking: Reported on 03/04/2020    [provider]  Lactobacillus (ACIDOPHILUS) 0.5 MG TABS Take 2 tablets by mouth 2 (two) times daily.  Patient not taking: Reported on 03/04/2020    [provider]  loperamide (LOPERAMIDE A-D) 2 MG tablet Take 2 mg by mouth daily as needed for diarrhea or loose stools.  Patient not taking: Reported on 03/04/2020    [provider]  Melatonin 3 MG TABS Take 3 mg by mouth at bedtime. Patient not taking: Reported on 03/04/2020    [provider]  NON FORMULARY Diet Type: Regular, NAS, Consistent Carbohydrate Patient not taking: Reported on 03/04/2020 01/29/19   [provider]    Allergies as of 03/04/2020  . (No Known Allergies)    Family History  Problem Relation Age of Onset  . Multiple myeloma Mother 52  . Colon cancer Neg Hx     Social History   Socioeconomic History  . Marital status: Married    Spouse name: Not on file  . Number of children: Not on file  . Years of education: Not on file  . Highest education level: Not on file  Occupational History  . Occupation: disabled  Tobacco Use  . Smoking status: Never Smoker  . Smokeless tobacco: Never Used  Substance and Sexual Activity  . Alcohol use: No  . Drug use: No  . Sexual activity: Not on file  Other Topics Concern  . Not on file  Social History Narrative  . Not on file   Social Determinants of Health   Financial Resource Strain:   . Difficulty of Paying Living Expenses: Not on file  Food Insecurity:   . Worried About Charity fundraiser in the Last Year: Not on file  . Ran Out of Food in the Last Year: Not on file  Transportation Needs:   . Lack of Transportation (Medical): Not on file  .  Lack of Transportation (Non-Medical): Not on file  Physical Activity:   . Days of Exercise per Week: Not on file  . Minutes of Exercise per Session: Not on file  Stress:   . Feeling of Stress : Not on file  Social Connections:   . Frequency of Communication with Friends and Family: Not on file  . Frequency of Social Gatherings with Friends and Family: Not on file  . Attends Religious Services: Not on file  . Active Member of Clubs or Organizations: Not on file  . Attends Archivist Meetings: Not on file  . Marital Status: Not on file  Intimate Partner Violence:   . Fear of Current or Ex-Partner: Not on file  . Emotionally Abused:  Not on file  . Physically Abused: Not on file  . Sexually Abused: Not on file    Review of Systems: See HPI, otherwise negative ROS  Physical Exam: BP 131/76   Pulse 67   Temp (!) 97.3 F (36.3 C) (Oral)   Ht _0  (1.778 m)   BMI 22.76 kg/m  General:   Alert, conversant.  In a wheelchair accompanied by his spouse.  Skin:  Intact without significant lesions or rashes. Neck:  Supple; no masses or thyromegaly. No significant cervical adenopathy. Lungs:  Clear throughout to auscultation.   No wheezes, crackles, or rhonchi. No acute distress. Heart:  Regular rate and rhythm; no murmurs, clicks, rubs,  or gallops. Abdomen: Non-distended, normal bowel sounds.  Soft and nontender without appreciable mass or hepatosplenomegaly.  Pulses:  Normal pulses noted. Extremities:  Without clubbing or edema. Rectal: Deferred till time of colonoscopy  Impression: 74 year old gentleman referred for average rescreening colonoscopy.  Has chronic intermittent nonbloody diarrhea.  He does take Metformin.  History of debilitation and poor ambulation secondary to a CVA.  More recently status post right great toe amputation further impedes balance and mobility.  Chronically anticoagulated.  He has had Eliquis interrupted briefly for procedures previously and is  done fine.  Recommendations:  I have offered the patient a screening colonoscopy in the near future.  We will set her further evaluation of diarrhea that time via segmental biopsies and stool sampling as appropriate.  We will have him stop Eliquis 2 days prior to the procedure.  Because of his limited mobility, we will have him patient on observation status in the hospital the day before the procedure to facilitate preparation.  The risks, benefits, limitations, alternatives and imponderables have been reviewed with the patient. Questions have been answered. All parties are agreeable.   Further recommendations to follow.    Notice: This dictation was prepared with Dragon dictation along with smaller phrase technology. Any transcriptional errors that result from this process are unintentional and may not be corrected upon review.

## 2020-03-04 NOTE — Patient Instructions (Addendum)
Schedule a screening colonoscopy-propofol-ASA 3  Hold Eliquis for 2 days prior to the procedure  Patient will need to be put in the hospital the day before to assist with colon prep  Hold glipizide and met Shanda Bumps the day before and day of the procedure.  We will need to start a clear liquid diet 2 days prior to the procedure.  Further recommendations to follow pending findings and colonoscopy

## 2020-04-08 ENCOUNTER — Telehealth: Payer: Self-pay | Admitting: *Deleted

## 2020-04-08 NOTE — Telephone Encounter (Signed)
Per OV note 02/2020 patient needs to be admitted for screening colonoscopy, ASA 3, propofol with Dr. Gala Romney. We are still unable to admit patients Dr. Gala Romney. Please advise, thanks

## 2020-04-10 ENCOUNTER — Encounter: Payer: Self-pay | Admitting: Internal Medicine

## 2020-04-10 NOTE — Telephone Encounter (Signed)
PATIENT SCHEDULED  °

## 2020-04-10 NOTE — Telephone Encounter (Signed)
Okay let us reevaluate within the next 3 months

## 2020-04-10 NOTE — Telephone Encounter (Signed)
Please schedule OV and mail letter. thanks

## 2020-04-12 IMAGING — DX RIGHT FOOT COMPLETE - 3+ VIEW
3 series · 3 of 3 positions shown · non-contrast
Comparison: Radiographs 12/30/2018

CLINICAL DATA: Swelling and drainage from right great toe. History
of diabetes.

EXAM:
RIGHT FOOT COMPLETE - 3+ VIEW

[foot ap]
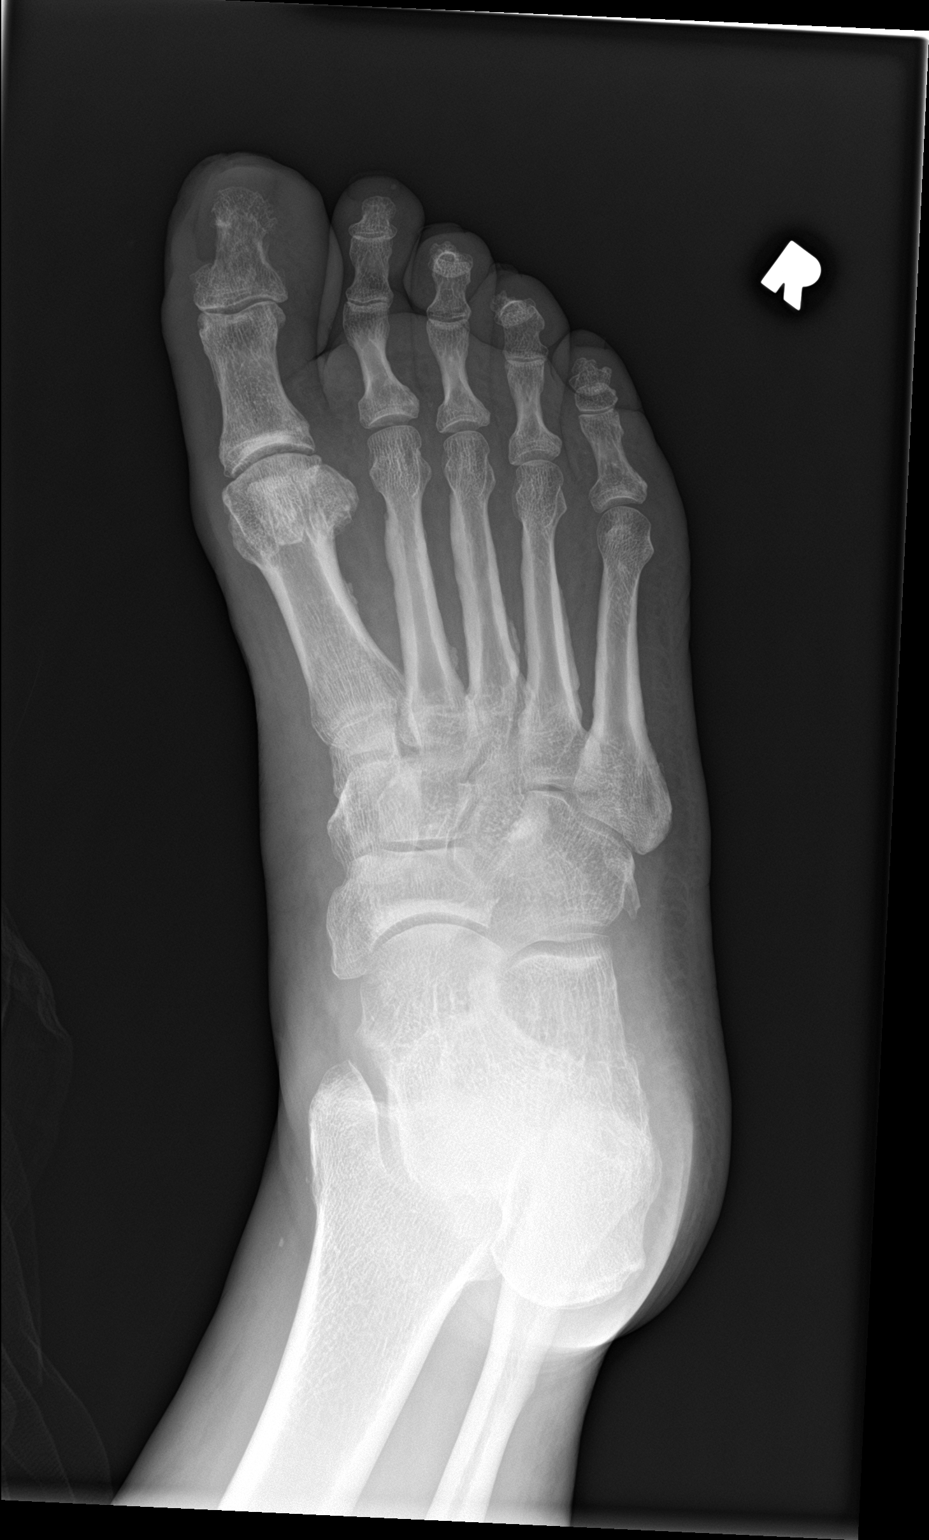

[foot obl]
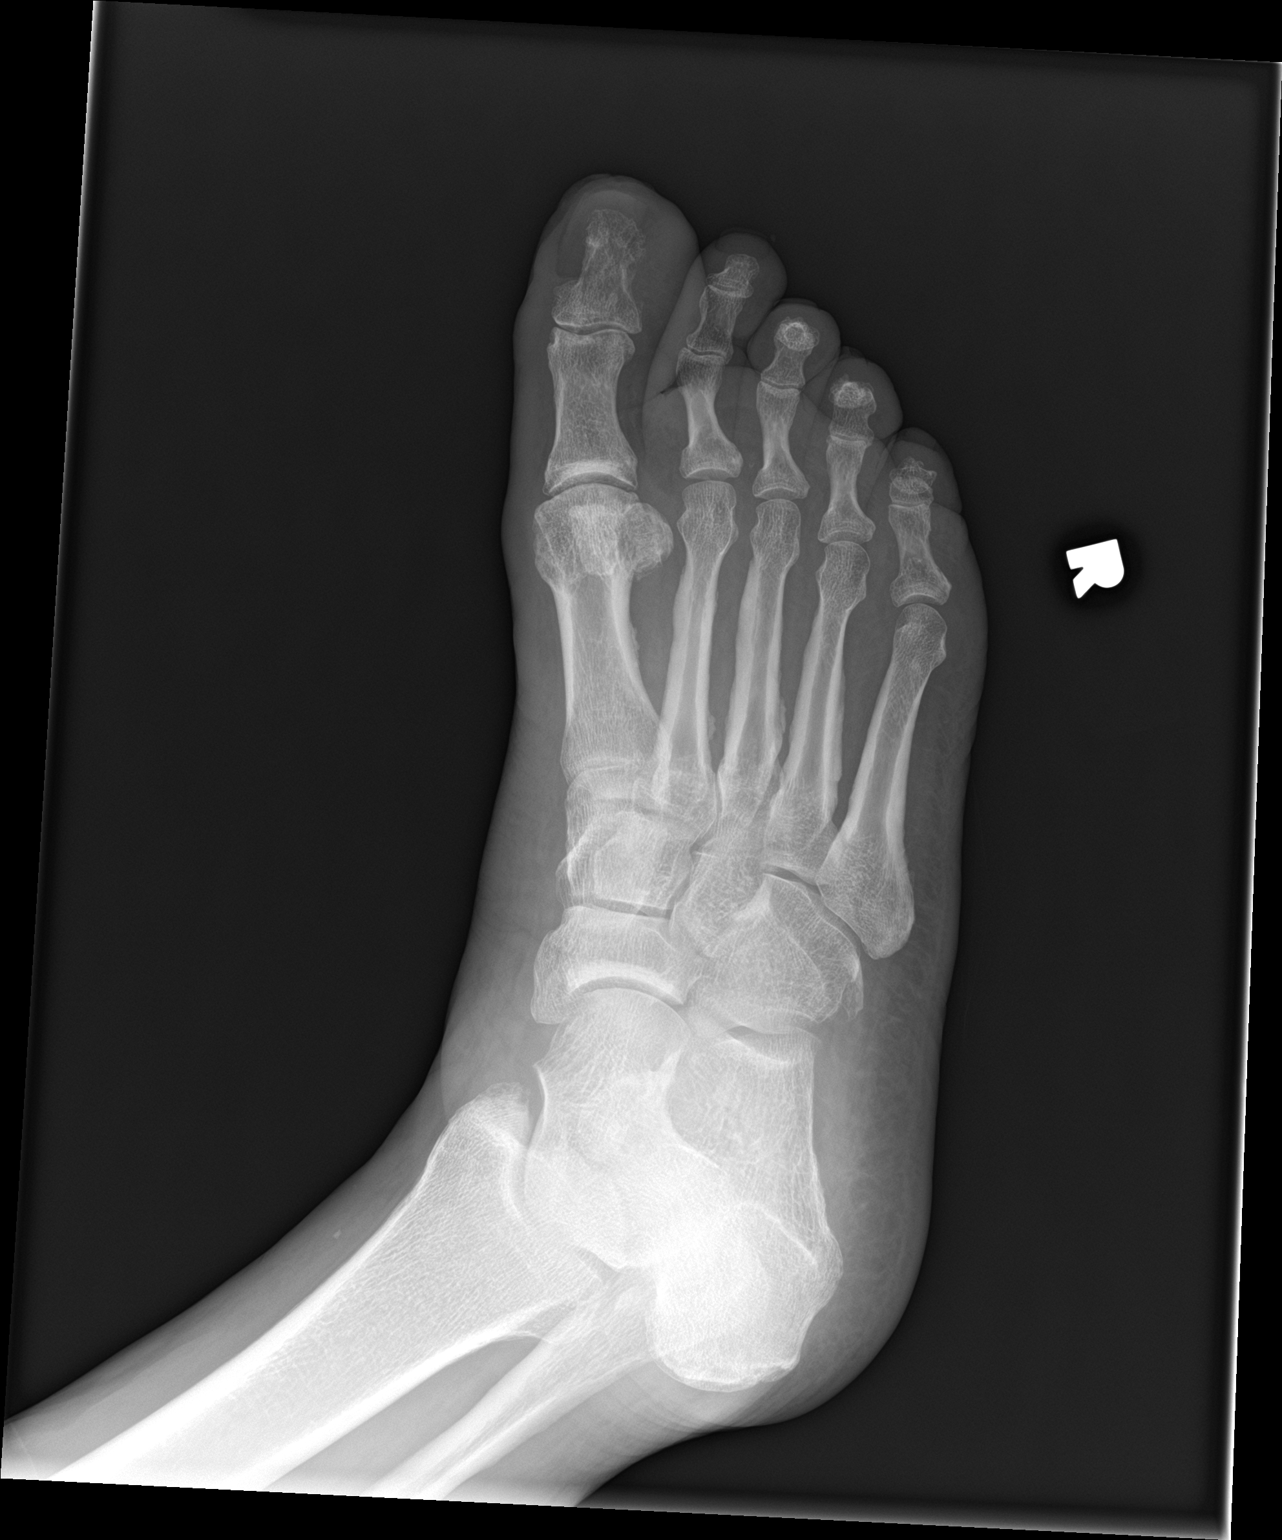

[foot lat]
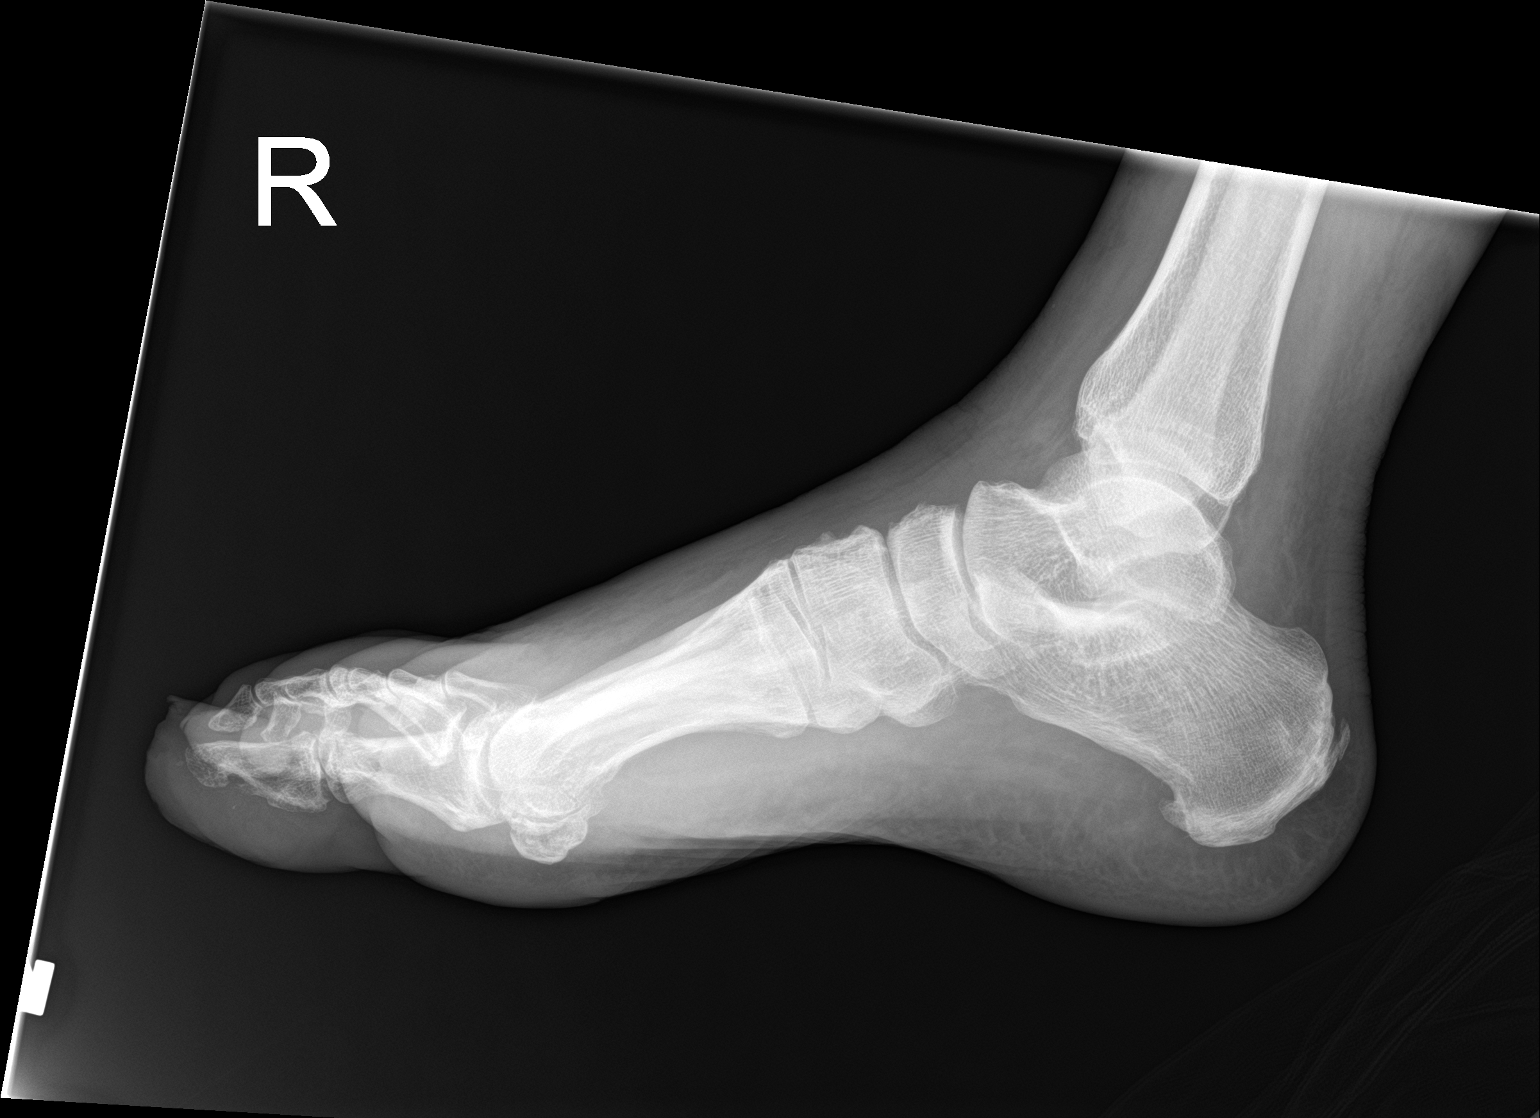

[3 of 3 positions shown; findings below may reference images not displayed]

FINDINGS: No acute fracture or traumatic malalignment. There is soft tissue
swelling and ulceration at the tip of the first digit. No
subcutaneous gas few punctate radiodensities are present within the
soft tissues which are new from prior exam and could reflect debris
or foreign body. No bony erosion, destructive change or immature
periostitis to suggest early radiographic features of osteomyelitis.
There is diffuse metatarsophalangeal and interphalangeal arthrosis.
Hammertoe deformities of the third and fourth digits. May mild mid
foot arthrosis is present as well. Please note alignment of the mid
and hindfoot is incompletely assessed on nonweightbearing
radiographs. Calcaneal spurs are present at the Achilles and plantar
ligament insertions.
IMPRESSION: 1. Soft tissue swelling and ulceration at the tip of the first
digit. Few punctate radiodensities within the soft tissues are new
from prior exam and could reflect debris or foreign body and should
be correlated with visual inspection.
2. No radiographic evidence of osteomyelitis. MRI is more sensitive
and specific for early findings of osteomyelitis.

## 2020-04-21 ENCOUNTER — Telehealth: Payer: Self-pay | Admitting: Internal Medicine

## 2020-04-21 NOTE — Telephone Encounter (Signed)
Called spouse and made aware we are not admitting patients for procedures at the hospital at this time

## 2020-04-21 NOTE — Telephone Encounter (Signed)
Pt's wife called to say patient was already seen in office in September and wants to know why patient can't go ahead and be scheduled his colonoscopy instead of coming back to the office in January. Please advise. [phone]

## 2020-04-23 DIAGNOSIS — W19XXXA Unspecified fall, initial encounter: Secondary | ICD-10-CM | POA: Diagnosis not present

## 2020-04-23 DIAGNOSIS — R5381 Other malaise: Secondary | ICD-10-CM | POA: Diagnosis not present

## 2020-04-23 DIAGNOSIS — Z743 Need for continuous supervision: Secondary | ICD-10-CM | POA: Diagnosis not present

## 2020-04-23 DIAGNOSIS — R531 Weakness: Secondary | ICD-10-CM | POA: Diagnosis not present

## 2020-06-20 ENCOUNTER — Ambulatory Visit: Payer: No Typology Code available for payment source | Admitting: Gastroenterology

## 2020-07-19 DIAGNOSIS — R404 Transient alteration of awareness: Secondary | ICD-10-CM | POA: Diagnosis not present

## 2020-07-19 DIAGNOSIS — Z743 Need for continuous supervision: Secondary | ICD-10-CM | POA: Diagnosis not present

## 2020-09-09 DIAGNOSIS — R531 Weakness: Secondary | ICD-10-CM | POA: Diagnosis not present

## 2020-09-09 DIAGNOSIS — R5381 Other malaise: Secondary | ICD-10-CM | POA: Diagnosis not present

## 2020-09-09 DIAGNOSIS — Z743 Need for continuous supervision: Secondary | ICD-10-CM | POA: Diagnosis not present

## 2020-09-09 DIAGNOSIS — W19XXXA Unspecified fall, initial encounter: Secondary | ICD-10-CM | POA: Diagnosis not present

## 2021-09-22 DIAGNOSIS — R5381 Other malaise: Secondary | ICD-10-CM | POA: Diagnosis not present

## 2021-09-22 DIAGNOSIS — T1490XA Injury, unspecified, initial encounter: Secondary | ICD-10-CM | POA: Diagnosis not present

## 2022-01-10 DIAGNOSIS — R5381 Other malaise: Secondary | ICD-10-CM | POA: Diagnosis not present

## 2022-01-10 DIAGNOSIS — Z743 Need for continuous supervision: Secondary | ICD-10-CM | POA: Diagnosis not present

## 2022-01-10 DIAGNOSIS — R69 Illness, unspecified: Secondary | ICD-10-CM | POA: Diagnosis not present

## 2022-05-02 DIAGNOSIS — R52 Pain, unspecified: Secondary | ICD-10-CM | POA: Diagnosis not present

## 2022-11-12 DIAGNOSIS — T148XXA Other injury of unspecified body region, initial encounter: Secondary | ICD-10-CM | POA: Diagnosis not present

## 2022-11-12 DIAGNOSIS — Z743 Need for continuous supervision: Secondary | ICD-10-CM | POA: Diagnosis not present

## 2022-11-12 DIAGNOSIS — R5381 Other malaise: Secondary | ICD-10-CM | POA: Diagnosis not present

## 2022-11-12 DIAGNOSIS — W19XXXA Unspecified fall, initial encounter: Secondary | ICD-10-CM | POA: Diagnosis not present

## 2022-11-13 DIAGNOSIS — G319 Degenerative disease of nervous system, unspecified: Secondary | ICD-10-CM | POA: Diagnosis not present

## 2022-11-13 DIAGNOSIS — I2510 Atherosclerotic heart disease of native coronary artery without angina pectoris: Secondary | ICD-10-CM | POA: Diagnosis not present

## 2022-11-13 DIAGNOSIS — I6782 Cerebral ischemia: Secondary | ICD-10-CM | POA: Diagnosis not present

## 2022-11-13 DIAGNOSIS — D3502 Benign neoplasm of left adrenal gland: Secondary | ICD-10-CM | POA: Diagnosis not present

## 2022-11-13 DIAGNOSIS — J9811 Atelectasis: Secondary | ICD-10-CM | POA: Diagnosis not present

## 2022-11-13 DIAGNOSIS — N1330 Unspecified hydronephrosis: Secondary | ICD-10-CM | POA: Diagnosis not present

## 2022-11-15 DIAGNOSIS — R059 Cough, unspecified: Secondary | ICD-10-CM | POA: Diagnosis not present

## 2022-11-15 DIAGNOSIS — R1310 Dysphagia, unspecified: Secondary | ICD-10-CM | POA: Diagnosis not present

## 2022-11-15 DIAGNOSIS — I6381 Other cerebral infarction due to occlusion or stenosis of small artery: Secondary | ICD-10-CM | POA: Diagnosis not present

## 2022-11-18 DIAGNOSIS — G459 Transient cerebral ischemic attack, unspecified: Secondary | ICD-10-CM | POA: Diagnosis not present

## 2022-11-18 DIAGNOSIS — R0902 Hypoxemia: Secondary | ICD-10-CM | POA: Diagnosis not present

## 2022-11-18 DIAGNOSIS — Z743 Need for continuous supervision: Secondary | ICD-10-CM | POA: Diagnosis not present

## 2022-11-27 DIAGNOSIS — R471 Dysarthria and anarthria: Secondary | ICD-10-CM | POA: Diagnosis not present

## 2022-11-27 DIAGNOSIS — R1312 Dysphagia, oropharyngeal phase: Secondary | ICD-10-CM | POA: Diagnosis not present

## 2022-11-27 DIAGNOSIS — I69398 Other sequelae of cerebral infarction: Secondary | ICD-10-CM | POA: Diagnosis not present

## 2022-11-27 DIAGNOSIS — R260 Ataxic gait: Secondary | ICD-10-CM | POA: Diagnosis not present

## 2022-11-28 DIAGNOSIS — I69398 Other sequelae of cerebral infarction: Secondary | ICD-10-CM | POA: Diagnosis not present

## 2022-11-28 DIAGNOSIS — R471 Dysarthria and anarthria: Secondary | ICD-10-CM | POA: Diagnosis not present

## 2022-11-28 DIAGNOSIS — R1312 Dysphagia, oropharyngeal phase: Secondary | ICD-10-CM | POA: Diagnosis not present

## 2022-11-28 DIAGNOSIS — R260 Ataxic gait: Secondary | ICD-10-CM | POA: Diagnosis not present

## 2022-11-29 DIAGNOSIS — I69354 Hemiplegia and hemiparesis following cerebral infarction affecting left non-dominant side: Secondary | ICD-10-CM | POA: Diagnosis not present

## 2022-11-29 DIAGNOSIS — I82509 Chronic embolism and thrombosis of unspecified deep veins of unspecified lower extremity: Secondary | ICD-10-CM | POA: Diagnosis not present

## 2022-11-29 DIAGNOSIS — N139 Obstructive and reflux uropathy, unspecified: Secondary | ICD-10-CM | POA: Diagnosis not present

## 2022-11-29 DIAGNOSIS — I69391 Dysphagia following cerebral infarction: Secondary | ICD-10-CM | POA: Diagnosis not present

## 2022-12-01 DIAGNOSIS — R051 Acute cough: Secondary | ICD-10-CM | POA: Diagnosis not present

## 2022-12-03 DIAGNOSIS — E1149 Type 2 diabetes mellitus with other diabetic neurological complication: Secondary | ICD-10-CM | POA: Diagnosis not present

## 2022-12-03 DIAGNOSIS — I69391 Dysphagia following cerebral infarction: Secondary | ICD-10-CM | POA: Diagnosis not present

## 2022-12-03 DIAGNOSIS — I82509 Chronic embolism and thrombosis of unspecified deep veins of unspecified lower extremity: Secondary | ICD-10-CM | POA: Diagnosis not present

## 2022-12-04 DIAGNOSIS — R471 Dysarthria and anarthria: Secondary | ICD-10-CM | POA: Diagnosis not present

## 2022-12-04 DIAGNOSIS — I69398 Other sequelae of cerebral infarction: Secondary | ICD-10-CM | POA: Diagnosis not present

## 2022-12-04 DIAGNOSIS — R1312 Dysphagia, oropharyngeal phase: Secondary | ICD-10-CM | POA: Diagnosis not present

## 2022-12-04 DIAGNOSIS — R260 Ataxic gait: Secondary | ICD-10-CM | POA: Diagnosis not present

## 2022-12-05 DIAGNOSIS — I69398 Other sequelae of cerebral infarction: Secondary | ICD-10-CM | POA: Diagnosis not present

## 2022-12-05 DIAGNOSIS — R471 Dysarthria and anarthria: Secondary | ICD-10-CM | POA: Diagnosis not present

## 2022-12-05 DIAGNOSIS — R1312 Dysphagia, oropharyngeal phase: Secondary | ICD-10-CM | POA: Diagnosis not present

## 2022-12-05 DIAGNOSIS — R260 Ataxic gait: Secondary | ICD-10-CM | POA: Diagnosis not present

## 2022-12-06 DIAGNOSIS — R1310 Dysphagia, unspecified: Secondary | ICD-10-CM | POA: Diagnosis not present

## 2022-12-06 DIAGNOSIS — R059 Cough, unspecified: Secondary | ICD-10-CM | POA: Diagnosis not present

## 2023-02-18 DIAGNOSIS — N390 Urinary tract infection, site not specified: Secondary | ICD-10-CM | POA: Diagnosis not present

## 2023-05-01 ENCOUNTER — Emergency Department (HOSPITAL_COMMUNITY): Payer: No Typology Code available for payment source

## 2023-05-01 ENCOUNTER — Other Ambulatory Visit: Payer: Self-pay

## 2023-05-01 ENCOUNTER — Inpatient Hospital Stay (HOSPITAL_COMMUNITY)
Admission: EM | Admit: 2023-05-01 | Discharge: 2023-05-03 | DRG: 699 | Disposition: A | Discharge status: Home Health | Payer: No Typology Code available for payment source | Attending: Family Medicine | Admitting: Family Medicine

## 2023-05-01 DIAGNOSIS — I152 Hypertension secondary to endocrine disorders: Secondary | ICD-10-CM | POA: Diagnosis present

## 2023-05-01 DIAGNOSIS — Z7984 Long term (current) use of oral hypoglycemic drugs: Secondary | ICD-10-CM | POA: Diagnosis not present

## 2023-05-01 DIAGNOSIS — Z8673 Personal history of transient ischemic attack (TIA), and cerebral infarction without residual deficits: Secondary | ICD-10-CM

## 2023-05-01 DIAGNOSIS — E1159 Type 2 diabetes mellitus with other circulatory complications: Secondary | ICD-10-CM

## 2023-05-01 DIAGNOSIS — E1169 Type 2 diabetes mellitus with other specified complication: Secondary | ICD-10-CM | POA: Diagnosis present

## 2023-05-01 DIAGNOSIS — Z7901 Long term (current) use of anticoagulants: Secondary | ICD-10-CM | POA: Diagnosis not present

## 2023-05-01 DIAGNOSIS — E119 Type 2 diabetes mellitus without complications: Secondary | ICD-10-CM

## 2023-05-01 DIAGNOSIS — Z89411 Acquired absence of right great toe: Secondary | ICD-10-CM | POA: Diagnosis not present

## 2023-05-01 DIAGNOSIS — N183 Chronic kidney disease, stage 3 unspecified: Secondary | ICD-10-CM | POA: Diagnosis present

## 2023-05-01 DIAGNOSIS — Y846 Urinary catheterization as the cause of abnormal reaction of the patient, or of later complication, without mention of misadventure at the time of the procedure: Secondary | ICD-10-CM | POA: Diagnosis present

## 2023-05-01 DIAGNOSIS — T83511A Infection and inflammatory reaction due to indwelling urethral catheter, initial encounter: Principal | ICD-10-CM | POA: Diagnosis present

## 2023-05-01 DIAGNOSIS — N1832 Chronic kidney disease, stage 3b: Secondary | ICD-10-CM | POA: Diagnosis present

## 2023-05-01 DIAGNOSIS — W19XXXA Unspecified fall, initial encounter: Secondary | ICD-10-CM | POA: Diagnosis not present

## 2023-05-01 DIAGNOSIS — N390 Urinary tract infection, site not specified: Secondary | ICD-10-CM | POA: Diagnosis present

## 2023-05-01 DIAGNOSIS — I69354 Hemiplegia and hemiparesis following cerebral infarction affecting left non-dominant side: Secondary | ICD-10-CM

## 2023-05-01 DIAGNOSIS — Z86718 Personal history of other venous thrombosis and embolism: Secondary | ICD-10-CM | POA: Diagnosis not present

## 2023-05-01 DIAGNOSIS — E1151 Type 2 diabetes mellitus with diabetic peripheral angiopathy without gangrene: Secondary | ICD-10-CM | POA: Diagnosis present

## 2023-05-01 DIAGNOSIS — Z743 Need for continuous supervision: Secondary | ICD-10-CM | POA: Diagnosis not present

## 2023-05-01 DIAGNOSIS — Z79899 Other long term (current) drug therapy: Secondary | ICD-10-CM | POA: Diagnosis not present

## 2023-05-01 DIAGNOSIS — E1122 Type 2 diabetes mellitus with diabetic chronic kidney disease: Secondary | ICD-10-CM | POA: Diagnosis present

## 2023-05-01 DIAGNOSIS — R739 Hyperglycemia, unspecified: Secondary | ICD-10-CM | POA: Diagnosis not present

## 2023-05-01 LAB — URINALYSIS, ROUTINE W REFLEX MICROSCOPIC
Bacteria, UA: NONE SEEN
Bilirubin Urine: NEGATIVE
Glucose, UA: >=500 mg/dL — AB
Ketones, ur: NEGATIVE mg/dL
Nitrite: NEGATIVE
Protein, ur: 100 mg/dL — AB
Specific Gravity, Urine: 1.014 (ref 1.005–1.030)
WBC, UA: >50 WBC/hpf (ref 0–5)
pH: 6 (ref 5.0–8.0)

## 2023-05-01 LAB — BASIC METABOLIC PANEL
Anion gap: 9 (ref 5–15)
BUN: 39 mg/dL — ABNORMAL HIGH (ref 8–23)
CO2: 22 mmol/L (ref 22–32)
Calcium: 8.9 mg/dL (ref 8.9–10.3)
Chloride: 102 mmol/L (ref 98–111)
Creatinine, Ser: 1.82 mg/dL — ABNORMAL HIGH (ref 0.61–1.24)
GFR, Estimated: 38 mL/min — ABNORMAL LOW (ref >60)
Glucose, Bld: 220 mg/dL — ABNORMAL HIGH (ref 70–99)
Potassium: 3.7 mmol/L (ref 3.5–5.1)
Sodium: 133 mmol/L — ABNORMAL LOW (ref 135–145)

## 2023-05-01 LAB — CBC
HCT: 42.4 % (ref 39.0–52.0)
Hemoglobin: 13.6 g/dL (ref 13.0–17.0)
MCH: 27.3 pg (ref 26.0–34.0)
MCHC: 32.1 g/dL (ref 30.0–36.0)
MCV: 85 fL (ref 80.0–100.0)
Platelets: 247 10*3/uL (ref 150–400)
RBC: 4.99 MIL/uL (ref 4.22–5.81)
RDW: 15.5 % (ref 11.5–15.5)
WBC: 8.9 10*3/uL (ref 4.0–10.5)
nRBC: 0 % (ref 0.0–0.2)

## 2023-05-01 LAB — GLUCOSE, CAPILLARY: Glucose-Capillary: 182 mg/dL — ABNORMAL HIGH (ref 70–99)

## 2023-05-01 MED ORDER — INSULIN ASPART 100 UNIT/ML IJ SOLN: 0.0000 [IU] | Freq: Every day | INTRAMUSCULAR | Status: DC

## 2023-05-01 MED ORDER — SODIUM CHLORIDE 0.9 % IV SOLN: INTRAVENOUS | Status: DC

## 2023-05-01 MED ORDER — POLYETHYLENE GLYCOL 3350 17 G PO PACK: 17.0000 g | PACK | Freq: Every day | ORAL | Status: DC | PRN

## 2023-05-01 MED ORDER — ACETAMINOPHEN 325 MG PO TABS
650.0000 mg | ORAL_TABLET | Freq: Four times a day (QID) | ORAL | Status: DC | PRN
  Administered 2023-05-02: 650 mg via ORAL
  Filled 2023-05-01: qty 2

## 2023-05-01 MED ORDER — HEPARIN SODIUM (PORCINE) 5000 UNIT/ML IJ SOLN
5000.0000 [IU] | Freq: Three times a day (TID) | INTRAMUSCULAR | Status: DC
  Administered 2023-05-01 – 2023-05-03 (×5): 5000 [IU] via SUBCUTANEOUS
  Filled 2023-05-01 (×5): qty 1

## 2023-05-01 MED ORDER — SODIUM CHLORIDE 0.9 % IV SOLN: 1.0000 g | Freq: Once | INTRAVENOUS | Status: DC

## 2023-05-01 MED ORDER — ONDANSETRON HCL 4 MG/2ML IJ SOLN
4.0000 mg | Freq: Four times a day (QID) | INTRAMUSCULAR | Status: DC | PRN
  Administered 2023-05-01: 4 mg via INTRAVENOUS
  Filled 2023-05-01: qty 2

## 2023-05-01 MED ORDER — ACETAMINOPHEN 650 MG RE SUPP: 650.0000 mg | Freq: Four times a day (QID) | RECTAL | Status: DC | PRN

## 2023-05-01 MED ORDER — INSULIN ASPART 100 UNIT/ML IJ SOLN
0.0000 [IU] | Freq: Three times a day (TID) | INTRAMUSCULAR | Status: DC
  Administered 2023-05-02: 1 [IU] via SUBCUTANEOUS

## 2023-05-01 MED ORDER — ONDANSETRON HCL 4 MG PO TABS: 4.0000 mg | ORAL_TABLET | Freq: Four times a day (QID) | ORAL | Status: DC | PRN

## 2023-05-01 MED ORDER — SODIUM CHLORIDE 0.9 % IV SOLN
1.0000 g | Freq: Once | INTRAVENOUS | Status: AC
  Administered 2023-05-01: 1 g via INTRAVENOUS
  Filled 2023-05-01: qty 10

## 2023-05-01 MED ORDER — SODIUM CHLORIDE 0.9 % IV SOLN: INTRAVENOUS | Status: AC

## 2023-05-01 NOTE — ED Provider Notes (Signed)
[facility] AT [facility] Provider Note   CSN: [id number] Arrival date & time: 05/01/23  1438     History { Chief Complaint  Patient presents with   Fatigue    PETTER SWARD is a 77 y.o. male.  Patient presents to the ED today complaining of a 2-day history of cloudy urine and left-sided weakness.  Patient has indwelling urinary catheter since July. 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  Patient denies any abdominal pain, chest pain, shortness of breath, penile/suprapubic pain.   Patient also reports falling from the couch and hitting his head on the ground while reaching for remote.  He is on Eliquis 5 mg.  Endorses neck soreness.  Denies headache, vision changes.   Patient's wife was also called to corroborate story.  She endorsed fall happening earlier today.  Which was the main reason for him coming in.  She states that his Foley catheter had not been replaced in over a month with last placement being October 15.  She also reported the cloudy urine and wanting that to be evaluated as well. HPI     Home Medications Prior to Admission medications   Medication Sig Start Date End Date Taking? Authorizing Provider  Alogliptin Benzoate 25 MG TABS Take by mouth as needed. Hold Metformin when he takes it.    [provider]  amLODipine (NORVASC) 2.5 MG tablet Take 1 tablet (2.5 mg total) by mouth daily. Patient taking differently: Take 5 mg by mouth daily.  02/15/19   Sharee Holster, NP  apixaban (ELIQUIS) 5 MG TABS tablet Take 1 tablet (5 mg total) by mouth 2 (two) times daily. 02/15/19   Sharee Holster, NP  atorvastatin (LIPITOR) 20 MG tablet Take 1 tablet (20 mg total) by mouth every morning. Patient taking differently: Take 10 mg by mouth daily.  02/15/19   Sharee Holster, NP  b complex vitamins tablet Take 1 tablet by mouth 2 (two) times daily.    [provider]  feeding supplement, ENSURE ENLIVE, (ENSURE ENLIVE)  LIQD Take 237 mLs by mouth 2 (two) times daily between meals. Due to insufficient meal intake Patient not taking: Reported on 03/04/2020    [provider]  glipiZIDE (GLUCOTROL) 5 MG tablet Take 0.5 tablets (2.5 mg total) by mouth 2 (two) times daily before a meal. 02/15/19   Chilton Si, Chong Sicilian, NP  Lactobacillus (ACIDOPHILUS) 0.5 MG TABS Take 2 tablets by mouth 2 (two) times daily.  Patient not taking: Reported on 03/04/2020    [provider]  loperamide (LOPERAMIDE A-D) 2 MG tablet Take 2 mg by mouth daily as needed for diarrhea or loose stools.  Patient not taking: Reported on 03/04/2020    [provider]  losartan (COZAAR) 100 MG tablet Take 1 tablet (100 mg total) by mouth daily. 02/15/19   Sharee Holster, NP  Melatonin 3 MG TABS Take 3 mg by mouth at bedtime. Patient not taking: Reported on 03/04/2020    [provider]  metFORMIN (GLUCOPHAGE) 1000 MG tablet Take 1 tablet (1,000 mg total) by mouth 2 (two) times daily with a meal. 02/15/19   Chilton Si, Chong Sicilian, NP  metoprolol succinate (TOPROL-XL) 25 MG 24 hr tablet Take 0.5 tablets (12.5 mg total) by mouth daily. Patient taking differently: Take 50 mg by mouth daily. Takes 1/2 tablet daily. 02/15/19   Sharee Holster, NP  NON FORMULARY Diet Type: Regular, NAS, Consistent Carbohydrate Patient not taking: Reported  on 03/04/2020 01/29/19   [provider]  Polyvinyl Alcohol-Povidone PF 1.4-0.6 % SOLN Place 1 drop into both eyes 2 (two) times daily. For dry eyes    [provider]      Allergies    Patient has no known allergies.    Review of Systems   Review of Systems  Neurological:  Positive for speech difficulty and weakness.  All other systems reviewed and are negative.   Physical Exam Updated Vital Signs BP 118/70   Pulse 94   Temp 99.2 F (37.3 C) (Rectal)   Resp 17   Ht 5\' 7"  (1.702 m)   Wt 74.8 kg   SpO2 100%   BMI 25.84 kg/m  Physical Exam Vitals and nursing note reviewed.   Constitutional:      Appearance: Normal appearance.  HENT:     Head: Normocephalic.     Comments: Bruising noted on the frontal aspects of his forehead.  Eyes:     Extraocular Movements: Extraocular movements intact.     Conjunctiva/sclera: Conjunctivae normal.     Pupils: Pupils are equal, round, and reactive to light.  Cardiovascular:     Rate and Rhythm: Regular rhythm. Tachycardia present.     Pulses: Normal pulses.     Heart sounds: Normal heart sounds.  Pulmonary:     Effort: Pulmonary effort is normal. No respiratory distress.     Breath sounds: Normal breath sounds.  Abdominal:     General: Abdomen is flat. There is no distension.     Palpations: Abdomen is soft. There is no mass.     Tenderness: There is no abdominal tenderness.  Musculoskeletal:     Cervical back: Normal range of motion. No tenderness.  Neurological:     Mental Status: He is alert and oriented to person, place, and time. Mental status is at baseline.     Comments: Left-sided weakness noted, patient states it is from previous CVA.  Psychiatric:        Mood and Affect: Mood normal.        Behavior: Behavior normal.     ED Results / Procedures / Treatments   Labs (all labs ordered are listed, but only abnormal results are displayed) Labs Reviewed  BASIC METABOLIC PANEL - Abnormal; Notable for the following components:      Result Value   Sodium 133 (*)    Glucose, Bld 220 (*)    BUN 39 (*)    Creatinine, Ser 1.82 (*)    GFR, Estimated 38 (*)    All other components within normal limits  URINALYSIS, ROUTINE W REFLEX MICROSCOPIC - Abnormal; Notable for the following components:   APPearance CLOUDY (*)    Glucose, UA >=500 (*)    Hgb urine dipstick MODERATE (*)    Protein, ur 100 (*)    Leukocytes,Ua LARGE (*)    All other components within normal limits  URINE CULTURE  CBC  CBG MONITORING, ED    EKG EKG Interpretation Date/Time:  Sunday May 01 2023 15:29:53 EST Ventricular Rate:   98 PR Interval:    QRS Duration:  142 QT Interval:  367 QTC Calculation: 469 R Axis:   -47  Text Interpretation: RBBB and LAFB Artifact in lead(s) I III aVR aVL V2 Confirmed by Eber Hong ([id number]) on 05/01/2023 4:13:45 PM  Radiology CT Head Wo Contrast  Result Date: 05/01/2023 CLINICAL DATA:  Head trauma, minor (Age >= 65y); Neck trauma (Age >= 65y). Weakness. Fall. Forehead abrasion. EXAM: CT  HEAD WITHOUT CONTRAST CT CERVICAL SPINE WITHOUT CONTRAST TECHNIQUE: Multidetector CT imaging of the head and cervical spine was performed following the standard protocol without intravenous contrast. Multiplanar CT image reconstructions of the cervical spine were also generated. RADIATION DOSE REDUCTION: This exam was performed according to the departmental dose-optimization program which includes automated exposure control, adjustment of the mA and/or kV according to patient size and/or use of iterative reconstruction technique. COMPARISON:  Head MRI 12/16/2017 FINDINGS: CT HEAD FINDINGS Brain: There is no evidence of an acute infarct, intracranial hemorrhage, mass, midline shift, or extra-axial fluid collection. There is mild cerebral and cerebellar atrophy. Cerebral white matter hypodensities are nonspecific but compatible with moderate chronic small vessel ischemic disease. A chronic deep white matter insult is again noted involving the right centrum semiovale and corona radiata with gliosis extending into the posterior internal capsule and with associated ex vacuo dilatation of the right lateral ventricle. A chronic infarct is also again noted in the ventral medulla. Vascular: Calcified atherosclerosis at the skull base. Skull: No acute fracture or suspicious osseous lesion. Sinuses/Orbits: The visualized paranasal sinuses and mastoid air cells are largely clear. Unremarkable orbits. Other: None. CT CERVICAL SPINE FINDINGS Alignment: Cervical spine straightening. Mild right convex curvature of the cervical  spine. Trace retrolisthesis of C3 on C4 and C4 on C5. Skull base and vertebrae: No acute fracture or destructive lesion. Soft tissues and spinal canal: No prevertebral fluid or swelling. No visible canal hematoma. Disc levels: Advanced disc degeneration from C4-5 through C6-7 with at least moderate neural foraminal stenosis at these levels, mild spinal stenosis at C6-7, and interbody ankylosis at C5-6 and C6-7. Upper chest: No mass or consolidation in the included lung apices. Other: 1.8 cm left thyroid nodule. Prominent atherosclerotic calcification of the carotid bifurcations. IMPRESSION: 1. No evidence of acute intracranial abnormality or cervical spine fracture. 2. Moderate chronic small vessel ischemic disease. 3. 1.8 cm left thyroid nodule. Recommend non-emergent thyroid ultrasound. Reference: J Am Coll Radiol. 2015 Feb;12(2): 143-50 Electronically Signed   By: Sebastian Ache M.D.   On: 05/01/2023 16:02   CT Cervical Spine Wo Contrast  Result Date: 05/01/2023 CLINICAL DATA:  Head trauma, minor (Age >= 65y); Neck trauma (Age >= 65y). Weakness. Fall. Forehead abrasion. EXAM: CT HEAD WITHOUT CONTRAST CT CERVICAL SPINE WITHOUT CONTRAST TECHNIQUE: Multidetector CT imaging of the head and cervical spine was performed following the standard protocol without intravenous contrast. Multiplanar CT image reconstructions of the cervical spine were also generated. RADIATION DOSE REDUCTION: This exam was performed according to the departmental dose-optimization program which includes automated exposure control, adjustment of the mA and/or kV according to patient size and/or use of iterative reconstruction technique. COMPARISON:  Head MRI 12/16/2017 FINDINGS: CT HEAD FINDINGS Brain: There is no evidence of an acute infarct, intracranial hemorrhage, mass, midline shift, or extra-axial fluid collection. There is mild cerebral and cerebellar atrophy. Cerebral white matter hypodensities are nonspecific but compatible with  moderate chronic small vessel ischemic disease. A chronic deep white matter insult is again noted involving the right centrum semiovale and corona radiata with gliosis extending into the posterior internal capsule and with associated ex vacuo dilatation of the right lateral ventricle. A chronic infarct is also again noted in the ventral medulla. Vascular: Calcified atherosclerosis at the skull base. Skull: No acute fracture or suspicious osseous lesion. Sinuses/Orbits: The visualized paranasal sinuses and mastoid air cells are largely clear. Unremarkable orbits. Other: None. CT CERVICAL SPINE FINDINGS Alignment: Cervical spine straightening. Mild right convex curvature of  the cervical spine. Trace retrolisthesis of C3 on C4 and C4 on C5. Skull base and vertebrae: No acute fracture or destructive lesion. Soft tissues and spinal canal: No prevertebral fluid or swelling. No visible canal hematoma. Disc levels: Advanced disc degeneration from C4-5 through C6-7 with at least moderate neural foraminal stenosis at these levels, mild spinal stenosis at C6-7, and interbody ankylosis at C5-6 and C6-7. Upper chest: No mass or consolidation in the included lung apices. Other: 1.8 cm left thyroid nodule. Prominent atherosclerotic calcification of the carotid bifurcations. IMPRESSION: 1. No evidence of acute intracranial abnormality or cervical spine fracture. 2. Moderate chronic small vessel ischemic disease. 3. 1.8 cm left thyroid nodule. Recommend non-emergent thyroid ultrasound. Reference: J Am Coll Radiol. 2015 Feb;12(2): 143-50 Electronically Signed   By: Sebastian Ache M.D.   On: 05/01/2023 16:02    Procedures Procedures    Medications Ordered in ED Medications  cefTRIAXone (ROCEPHIN) 1 g in sodium chloride 0.9 % 100 mL IVPB (0 g Intravenous Stopped 05/01/23 1909)    ED Course/ Medical Decision Making/ A&P                                 Medical Decision Making Amount and/or Complexity of Data Reviewed Labs:  ordered. Radiology: ordered.     Patient presents to the ED for concern of cloudy urine and left-sided weakness, this involves an extensive number of treatment options, and is a complaint that carries with it a high risk of complications and morbidity.  The differential diagnosis includes UTI, pyelonephrosis, CVA, epidural hemorrhage, subdural hemorrhage.    Co morbidities that complicate the patient evaluation  On blood thinners Previous CVA Type 2 diabetes   Additional history obtained:  Additional history obtained from  Nursing, Outside Medical Records, and Past Admission   External records from outside source obtained and reviewed including previous admission notes and discharge notes.   Lab Tests:  I Ordered, and personally interpreted labs.  The pertinent results include:      Imaging Studies ordered:  I ordered imaging studies including CTA head and neck without contrast.  I independently visualized and interpreted imaging which showed no emergent pathology.  A 1.8 cm nodule was noted on left side of the thyroid. I agree with the radiologist interpretation   Cardiac Monitoring:  Cardiac monitoring was done for this visit.   Medicines ordered and prescription drug management:  I ordered medication including nitrofurantoin  for UTI   Reevaluation of the patient after these medicines showed that the patient worsened patient began vomiting within a few hours of antibiotics given . I have reviewed the patients home medicines and have made adjustments as needed   Test Considered:  CT abdomen --considered if experiencing abdominal pain abnormality    Critical Interventions:  1 g of Rocephin given in the ER for complicated UTI.   Consultations Obtained:  I requested consultation with attending and hospitalist,  and discussed lab and imaging findings as well as pertinent plan - they recommend: Admission due to complicated UTI, failing p.o. challenge,  worsening left-sided weakness, and tachycardia.  Patient is needing to undergo further evaluation and treatment.   Problem List / ED Course:  Mechanical Fall --patient presented to the ED today after falling face first off the couch reaching for remote hitting his head.  He is on blood thinners. No LOC.  Presented with small injuries on the frontal aspect of his forehead.  Patient was alert and oriented x 3 able to recount events.  However wife was called to collaborate story.  She also said that he had fallen while reaching for a remote.  UTI --patient also had indwelling UTI placed in July.  Last changed on October 15 according to wife.  She states that she has been trying to get it changed and has been unable to coordinate with his care team to get it changed.  She noticed his urine has changed to a cloudy milky color 2 days ago.  Which was also accompanied by increased left-sided weakness.  However patient does have a history of CVA with left-sided deficits.  Patient was given 1 g of Rocephin in the ER for the UTI and internal indwelling catheter was changed.  However still had residual tachycardia.  Then failed a p.o. challenge by vomiting 200 mL of green fluid.  Attending and hospitalist both agreed for patient admission due to UTI, increased left-sided weakness, failed p.o. challenge, tachycardia for further evaluation and treatment.   Reevaluation:  After the interventions noted above, I reevaluated the patient and found that they have :worsened   Social Determinants of Health:  Foley catheter   Dispostion:  After consideration of the diagnostic results and the patients response to treatment, I feel that the patent would benefit from admission due to recent listed above.    Final Clinical Impression(s) / ED Diagnoses Final diagnoses:  Urinary tract infection associated with indwelling urethral catheter, initial encounter [facility])  Accident due to mechanical fall without injury,  initial encounter    Rx / DC Orders ED Discharge Orders     None         Lavonia Drafts 05/01/23 2124    Eber Hong, MD 05/02/23 1400

## 2023-05-01 NOTE — Assessment & Plan Note (Addendum)
UA suggestive of UTI.  Rules out for sepsis.  Tmax 99.2.  Heart rate 90s.  WBC 8.9.  UTI complicated by the presence of chronic indwelling Foley catheter.  Follows with urology at the [place].  Most recent urine cultures -02/18/2023 per Care Everywhere grew E. coli, resistant to quinolones and tetracycline otherwise pansensitive. -Consider changing Foley catheter prior to discharge -Continue IV ceftriaxone 1 g daily - N/s 75cc/hr x 20hrs -With vomiting, clear liquid diet for now

## 2023-05-01 NOTE — Assessment & Plan Note (Signed)
CKD stage IIIb.  Creatinine 1.8, per Care Everywhere, creatinine last checked 11/18/2022 was 2.

## 2023-05-01 NOTE — ED Triage Notes (Signed)
Pt arrived [facility] for c/o weakness and milk colored urine in his foley cath x 2 days.

## 2023-05-01 NOTE — H&P (Signed)
History and Physical    Wesley Ingram UJW:[id number] DOB: 31-Jul-1945 DOA: 05/01/2023  PCP: [facility]   Patient coming from: Home  I have personally briefly reviewed patient's old medical records in [facility]  Chief Complaint:  Weakness, change in urine colour  HPI: Wesley Ingram is a 77 y.o. male with medical history significant for diabetes mellitus, hypertension, stroke with left-sided hemiparesis, DVT on chronic anticoagulation. Patient was brought to the ED from home with reports of generalized weakness over the past 2 days.  Family also noticed urine draining from Foley was milky colored.  No abdominal pain or flank pain.  Reports 1 episode of vomiting here in the ED.  Reports no change in his left-sided weakness.  No cough no difficulty breathing.  Reports falls, and hitting his head. Reports has had an indwelling chronic Foley catheter since July this year, and his urologist is with the [place].  ED Course: Tmax 99.2 heart rate 91-98.  Respiratory rate 14-21.  Blood pressure systolic 118-147.  O2 sat 100% on room air. UA suggestive of UTI. Creatinine close to baseline at 1.8-Per Care Everywhere. CT head, CT cervical spine negative for acute abnormality. IV ceftriaxone 1 g given. Patient became nauseous in the ED and vomited, hence hospitalization requested.  Review of Systems: As per HPI all other systems reviewed and negative.  Past Medical History:  Diagnosis Date   Diabetes mellitus    DVT (deep venous thrombosis) (HCC)    Foot ulcer due to secondary DM (HCC)    High triglycerides    Hypertension    PVD (peripheral vascular disease) (HCC)    Renal disorder    Stroke [facility]) 2008   left hemiparesis    Past Surgical History:  Procedure Laterality Date   AMPUTATION Right 01/25/2019   Procedure: AMPUTATION RIGHT GREAT TOE;  Surgeon: Nada Libman, MD;  Location: [facility] OR;  Service: Vascular;  Laterality: Right;   COLONOSCOPY  2009   [facility] in [place]: diverticulosis, next TCS in 07/2017   CYSTOSCOPY WITH RETROGRADE PYELOGRAM, URETEROSCOPY AND STENT PLACEMENT Right 11/19/2016   Procedure: CYSTOSCOPY WITH RETROGRADE PYELOGRAM, URETEROSCOPY AND STENT EXCHANGE;  Surgeon: Sebastian Ache, MD;  Location: [facility] ORS;  Service: Urology;  Laterality: Right;   CYSTOSCOPY WITH STENT PLACEMENT Right 10/19/2016   Procedure: CYSTOSCOPY WITH STENT PLACEMENT;  Surgeon: Sebastian Ache, MD;  Location: [facility]S;  Service: Urology;  Laterality: Right;   ENDARTERECTOMY     Left popliteal artery endarterectomy with left above-knee popliteal artery to tibial peroneal trunk bypass with reverse saphenous vein   HERNIA REPAIR     bilateral inguinal   HOLMIUM LASER APPLICATION Right 11/19/2016   Procedure: HOLMIUM LASER APPLICATION;  Surgeon: Sebastian Ache, MD;  Location: [facility]S;  Service: Urology;  Laterality: Right;   TOE AMPUTATION     TONSILLECTOMY       reports that he has never smoked. He has never used smokeless tobacco. He reports that he does not drink alcohol and does not use drugs.  No Known Allergies  Family History  Problem Relation Age of Onset   Multiple myeloma Mother 27   Colon cancer Neg Hx    Prior to Admission medications   Medication Sig Start Date End Date Taking? Authorizing Provider  Alogliptin Benzoate 25 MG TABS Take by mouth as needed. Hold Metformin when he takes it.    [provider]  amLODipine (NORVASC) 2.5 MG tablet Take 1 tablet (2.5 mg total)  by mouth daily. Patient taking differently: Take 5 mg by mouth daily.  02/15/19   Sharee Holster, NP  apixaban (ELIQUIS) 5 MG TABS tablet Take 1 tablet (5 mg total) by mouth 2 (two) times daily. 02/15/19   Sharee Holster, NP  atorvastatin (LIPITOR) 20 MG tablet Take 1 tablet (20 mg total) by mouth every morning. Patient taking differently: Take 10 mg by mouth daily.  02/15/19   Sharee Holster, NP  b complex vitamins tablet Take 1 tablet by mouth 2 (two) times daily.     [provider]  feeding supplement, ENSURE ENLIVE, (ENSURE ENLIVE) LIQD Take 237 mLs by mouth 2 (two) times daily between meals. Due to insufficient meal intake Patient not taking: Reported on 03/04/2020    [provider]  glipiZIDE (GLUCOTROL) 5 MG tablet Take 0.5 tablets (2.5 mg total) by mouth 2 (two) times daily before a meal. 02/15/19   Chilton Si, Chong Sicilian, NP  Lactobacillus (ACIDOPHILUS) 0.5 MG TABS Take 2 tablets by mouth 2 (two) times daily.  Patient not taking: Reported on 03/04/2020    [provider]  loperamide (LOPERAMIDE A-D) 2 MG tablet Take 2 mg by mouth daily as needed for diarrhea or loose stools.  Patient not taking: Reported on 03/04/2020    [provider]  losartan (COZAAR) 100 MG tablet Take 1 tablet (100 mg total) by mouth daily. 02/15/19   Sharee Holster, NP  Melatonin 3 MG TABS Take 3 mg by mouth at bedtime. Patient not taking: Reported on 03/04/2020    [provider]  metFORMIN (GLUCOPHAGE) 1000 MG tablet Take 1 tablet (1,000 mg total) by mouth 2 (two) times daily with a meal. 02/15/19   Chilton Si, Chong Sicilian, NP  metoprolol succinate (TOPROL-XL) 25 MG 24 hr tablet Take 0.5 tablets (12.5 mg total) by mouth daily. Patient taking differently: Take 50 mg by mouth daily. Takes 1/2 tablet daily. 02/15/19   Sharee Holster, NP  NON FORMULARY Diet Type: Regular, NAS, Consistent Carbohydrate Patient not taking: Reported on 03/04/2020 01/29/19   [provider]  Polyvinyl Alcohol-Povidone PF 1.4-0.6 % SOLN Place 1 drop into both eyes 2 (two) times daily. For dry eyes    [provider]    Physical Exam: Vitals:   05/01/23 1842 05/01/23 1900 05/01/23 1906 05/01/23 1930  BP:  124/70  118/70  Pulse:      Resp:  14  17  Temp: 99.2 F (37.3 C)     TempSrc: Rectal     SpO2:   100%   Weight:      Height:        Constitutional: NAD, calm, comfortable Vitals:   05/01/23 1842 05/01/23 1900 05/01/23 1906 05/01/23 1930  BP:   124/70  118/70  Pulse:      Resp:  14  17  Temp: 99.2 F (37.3 C)     TempSrc: Rectal     SpO2:   100%   Weight:      Height:       Eyes: PERRL, lids and conjunctivae normal ENMT: Mucous membranes are moist.  Neck: normal, supple, no masses, no thyromegaly Respiratory: clear to auscultation bilaterally, no wheezing, no crackles. Normal respiratory effort. No accessory muscle use.  Cardiovascular: Regular rate and rhythm, no murmurs / rubs / gallops. No extremity edema.  Extremities warm. Abdomen: no tenderness, no masses palpated. No hepatosplenomegaly. Bowel sounds positive.  Chronic indwelling Foley catheter present. Musculoskeletal: no clubbing / cyanosis. No joint  deformity upper and lower extremities.  Skin: no rashes, lesions, ulcers. No induration Neurologic: No facial asymmetry, speech slow but fluent, chronic weakness to left side.  Generalized weakness, but able to move bilateral lower extremity, unable to grip with his left UE-baseline. Psychiatric: Normal judgment and insight. Alert and oriented x 3. Normal mood.   Labs on Admission: I have personally reviewed following labs and imaging studies  CBC: Recent Labs  Lab 05/01/23 1504  WBC 8.9  HGB 13.6  HCT 42.4  MCV 85.0  PLT 247   Basic Metabolic Panel: Recent Labs  Lab 05/01/23 1504  NA 133*  K 3.7  CL 102  CO2 22  GLUCOSE 220*  BUN 39*  CREATININE 1.82*  CALCIUM 8.9   Urine analysis:    Component Value Date/Time   COLORURINE YELLOW 05/01/2023 1636   APPEARANCEUR CLOUDY (A) 05/01/2023 1636   LABSPEC 1.014 05/01/2023 1636   PHURINE 6.0 05/01/2023 1636   GLUCOSEU >=500 (A) 05/01/2023 1636   HGBUR MODERATE (A) 05/01/2023 1636   BILIRUBINUR NEGATIVE 05/01/2023 1636   KETONESUR NEGATIVE 05/01/2023 1636   PROTEINUR 100 (A) 05/01/2023 1636   NITRITE NEGATIVE 05/01/2023 1636   LEUKOCYTESUR LARGE (A) 05/01/2023 1636    Radiological Exams on Admission: CT Head Wo Contrast  Result Date:  05/01/2023 CLINICAL DATA:  Head trauma, minor (Age >= 65y); Neck trauma (Age >= 65y). Weakness. Fall. Forehead abrasion. EXAM: CT HEAD WITHOUT CONTRAST CT CERVICAL SPINE WITHOUT CONTRAST TECHNIQUE: Multidetector CT imaging of the head and cervical spine was performed following the standard protocol without intravenous contrast. Multiplanar CT image reconstructions of the cervical spine were also generated. RADIATION DOSE REDUCTION: This exam was performed according to the departmental dose-optimization program which includes automated exposure control, adjustment of the mA and/or kV according to patient size and/or use of iterative reconstruction technique. COMPARISON:  Head MRI 12/16/2017 FINDINGS: CT HEAD FINDINGS Brain: There is no evidence of an acute infarct, intracranial hemorrhage, mass, midline shift, or extra-axial fluid collection. There is mild cerebral and cerebellar atrophy. Cerebral white matter hypodensities are nonspecific but compatible with moderate chronic small vessel ischemic disease. A chronic deep white matter insult is again noted involving the right centrum semiovale and corona radiata with gliosis extending into the posterior internal capsule and with associated ex vacuo dilatation of the right lateral ventricle. A chronic infarct is also again noted in the ventral medulla. Vascular: Calcified atherosclerosis at the skull base. Skull: No acute fracture or suspicious osseous lesion. Sinuses/Orbits: The visualized paranasal sinuses and mastoid air cells are largely clear. Unremarkable orbits. Other: None. CT CERVICAL SPINE FINDINGS Alignment: Cervical spine straightening. Mild right convex curvature of the cervical spine. Trace retrolisthesis of C3 on C4 and C4 on C5. Skull base and vertebrae: No acute fracture or destructive lesion. Soft tissues and spinal canal: No prevertebral fluid or swelling. No visible canal hematoma. Disc levels: Advanced disc degeneration from C4-5 through C6-7 with  at least moderate neural foraminal stenosis at these levels, mild spinal stenosis at C6-7, and interbody ankylosis at C5-6 and C6-7. Upper chest: No mass or consolidation in the included lung apices. Other: 1.8 cm left thyroid nodule. Prominent atherosclerotic calcification of the carotid bifurcations. IMPRESSION: 1. No evidence of acute intracranial abnormality or cervical spine fracture. 2. Moderate chronic small vessel ischemic disease. 3. 1.8 cm left thyroid nodule. Recommend non-emergent thyroid ultrasound. Reference: J Am Coll Radiol. 2015 Feb;12(2): 143-50 Electronically Signed   By: Sebastian Ache M.D.   On: 05/01/2023 16:02  CT Cervical Spine Wo Contrast  Result Date: 05/01/2023 CLINICAL DATA:  Head trauma, minor (Age >= 65y); Neck trauma (Age >= 65y). Weakness. Fall. Forehead abrasion. EXAM: CT HEAD WITHOUT CONTRAST CT CERVICAL SPINE WITHOUT CONTRAST TECHNIQUE: Multidetector CT imaging of the head and cervical spine was performed following the standard protocol without intravenous contrast. Multiplanar CT image reconstructions of the cervical spine were also generated. RADIATION DOSE REDUCTION: This exam was performed according to the departmental dose-optimization program which includes automated exposure control, adjustment of the mA and/or kV according to patient size and/or use of iterative reconstruction technique. COMPARISON:  Head MRI 12/16/2017 FINDINGS: CT HEAD FINDINGS Brain: There is no evidence of an acute infarct, intracranial hemorrhage, mass, midline shift, or extra-axial fluid collection. There is mild cerebral and cerebellar atrophy. Cerebral white matter hypodensities are nonspecific but compatible with moderate chronic small vessel ischemic disease. A chronic deep white matter insult is again noted involving the right centrum semiovale and corona radiata with gliosis extending into the posterior internal capsule and with associated ex vacuo dilatation of the right lateral ventricle.  A chronic infarct is also again noted in the ventral medulla. Vascular: Calcified atherosclerosis at the skull base. Skull: No acute fracture or suspicious osseous lesion. Sinuses/Orbits: The visualized paranasal sinuses and mastoid air cells are largely clear. Unremarkable orbits. Other: None. CT CERVICAL SPINE FINDINGS Alignment: Cervical spine straightening. Mild right convex curvature of the cervical spine. Trace retrolisthesis of C3 on C4 and C4 on C5. Skull base and vertebrae: No acute fracture or destructive lesion. Soft tissues and spinal canal: No prevertebral fluid or swelling. No visible canal hematoma. Disc levels: Advanced disc degeneration from C4-5 through C6-7 with at least moderate neural foraminal stenosis at these levels, mild spinal stenosis at C6-7, and interbody ankylosis at C5-6 and C6-7. Upper chest: No mass or consolidation in the included lung apices. Other: 1.8 cm left thyroid nodule. Prominent atherosclerotic calcification of the carotid bifurcations. IMPRESSION: 1. No evidence of acute intracranial abnormality or cervical spine fracture. 2. Moderate chronic small vessel ischemic disease. 3. 1.8 cm left thyroid nodule. Recommend non-emergent thyroid ultrasound. Reference: J Am Coll Radiol. 2015 Feb;12(2): 143-50 Electronically Signed   By: Sebastian Ache M.D.   On: 05/01/2023 16:02    EKG: Independently reviewed.  Sinus rhythm, rate 98, QTc 469.  Artifacts present.  RBBB, LAFB.  Assessment/Plan Principal Problem:   Complicated UTI (urinary tract infection) Active Problems:   Type 2 diabetes mellitus (HCC)   Hypertension associated with type 2 diabetes mellitus (HCC)   History of CVA (cerebrovascular accident)   Chronic anticoagulation   CKD (chronic kidney disease) stage 3, GFR 30-59 ml/min (HCC)   Assessment and Plan: * Complicated UTI (urinary tract infection) UA suggestive of UTI.  Rules out for sepsis.  Tmax 99.2.  Heart rate 90s.  WBC 8.9.  UTI complicated by the  presence of chronic indwelling Foley catheter.  Follows with urology at the [place].  Most recent urine cultures -02/18/2023 per Care Everywhere grew E. coli, resistant to quinolones and tetracycline otherwise pansensitive. -Consider changing Foley catheter prior to discharge -Continue IV ceftriaxone 1 g daily - N/s 75cc/hr x 20hrs -With vomiting, clear liquid diet for now  CKD (chronic kidney disease) stage 3, GFR 30-59 ml/min (HCC) CKD stage IIIb.  Creatinine 1.8, per Care Everywhere, creatinine last checked 11/18/2022 was 2.   Chronic anticoagulation Per care everywhere- on discharge from then hospital 02/2023- Eliquis was decreased from 5mg  bid to 2.5mg  bid given addition  of aspirin. -Med rec pending, he is not sure if he is taking Eliquis, -DVT prophylaxis with heparin for now   History of CVA (cerebrovascular accident) CVA with chronic left-sided deficits. -TOC consult, rule out abuse, please see signout.  Hypertension associated with type 2 diabetes mellitus (HCC) Stable. On Norvasc, losartan, metoprolol. - Resume home meds pending med rec  Type 2 diabetes mellitus (HCC) - HgbA1c -Hold glipizide and metformin - SSI- S   DVT prophylaxis: heparin Code Status: FULL code Family Communication: None at bedside Disposition Plan: ~ 2days Consults called:None Admission status: Inpt Medsurg I certify that at the point of admission it is my clinical judgment that the patient will require inpatient hospital care spanning beyond 2 midnights from the point of admission due to high intensity of service, high risk for further deterioration and high frequency of surveillance required.  Author: Onnie Boer, MD 05/01/2023 10:01 PM  For on call review [email or web address].

## 2023-05-01 NOTE — Assessment & Plan Note (Addendum)
Per care everywhere- on discharge from then hospital 02/2023- Eliquis was decreased from 5mg  bid to 2.5mg  bid given addition of aspirin. -Med rec pending, he is not sure if he is taking Eliquis, -DVT prophylaxis with heparin for now

## 2023-05-01 NOTE — Assessment & Plan Note (Addendum)
CVA with chronic left-sided deficits. -TOC consult, rule out abuse, please see signout.

## 2023-05-01 NOTE — Assessment & Plan Note (Addendum)
Stable. On Norvasc, losartan, metoprolol. - Resume home meds pending med rec

## 2023-05-01 NOTE — Assessment & Plan Note (Addendum)
-   HgbA1c -Hold glipizide and metformin - SSI- S

## 2023-05-02 ENCOUNTER — Encounter (HOSPITAL_COMMUNITY): Payer: Self-pay | Admitting: Internal Medicine

## 2023-05-02 DIAGNOSIS — N390 Urinary tract infection, site not specified: Secondary | ICD-10-CM | POA: Diagnosis not present

## 2023-05-02 LAB — CBC
HCT: 42.6 % (ref 39.0–52.0)
Hemoglobin: 13.6 g/dL (ref 13.0–17.0)
MCH: 26.9 pg (ref 26.0–34.0)
MCHC: 31.9 g/dL (ref 30.0–36.0)
MCV: 84.4 fL (ref 80.0–100.0)
Platelets: 250 10*3/uL (ref 150–400)
RBC: 5.05 MIL/uL (ref 4.22–5.81)
RDW: 15.4 % (ref 11.5–15.5)
WBC: 10.6 10*3/uL — ABNORMAL HIGH (ref 4.0–10.5)
nRBC: 0 % (ref 0.0–0.2)

## 2023-05-02 LAB — BASIC METABOLIC PANEL
Anion gap: 13 (ref 5–15)
BUN: 31 mg/dL — ABNORMAL HIGH (ref 8–23)
CO2: 20 mmol/L — ABNORMAL LOW (ref 22–32)
Calcium: 9 mg/dL (ref 8.9–10.3)
Chloride: 103 mmol/L (ref 98–111)
Creatinine, Ser: 1.46 mg/dL — ABNORMAL HIGH (ref 0.61–1.24)
GFR, Estimated: 50 mL/min — ABNORMAL LOW (ref >60)
Glucose, Bld: 149 mg/dL — ABNORMAL HIGH (ref 70–99)
Potassium: 3.8 mmol/L (ref 3.5–5.1)
Sodium: 136 mmol/L (ref 135–145)

## 2023-05-02 LAB — HEMOGLOBIN A1C
Hgb A1c MFr Bld: 6.5 % — ABNORMAL HIGH (ref 4.8–5.6)
Mean Plasma Glucose: 139.85 mg/dL

## 2023-05-02 LAB — GLUCOSE, CAPILLARY
Glucose-Capillary: 138 mg/dL — ABNORMAL HIGH (ref 70–99)
Glucose-Capillary: 134 mg/dL — ABNORMAL HIGH (ref 70–99)
Glucose-Capillary: 189 mg/dL — ABNORMAL HIGH (ref 70–99)

## 2023-05-02 MED ORDER — LABETALOL HCL 5 MG/ML IV SOLN: 10.0000 mg | INTRAVENOUS | Status: DC | PRN

## 2023-05-02 MED ORDER — SODIUM CHLORIDE 0.9 % IV SOLN
1.0000 g | INTRAVENOUS | Status: DC
  Administered 2023-05-02 – 2023-05-03 (×2): 1 g via INTRAVENOUS
  Filled 2023-05-02 (×3): qty 10

## 2023-05-02 MED ORDER — ASPIRIN 81 MG PO TBEC
81.0000 mg | DELAYED_RELEASE_TABLET | Freq: Every day | ORAL | Status: DC
  Administered 2023-05-03: 81 mg via ORAL
  Filled 2023-05-02: qty 1

## 2023-05-02 NOTE — Progress Notes (Signed)
Pt has complained of neck pain despite heat pack, position change, and tylenol. He did not sleep overnight. Kellogg RN
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
## 2023-05-02 NOTE — Plan of Care (Signed)
  Problem: Acute Rehab PT Goals(only PT should resolve) Goal: Pt Will Go Supine/Side To Sit Outcome: Progressing Flowsheets (Taken 05/02/2023 1528) Pt will go Supine/Side to Sit:  with moderate assist  with minimal assist Goal: Patient Will Transfer Sit To/From Stand Outcome: Progressing Flowsheets (Taken 05/02/2023 1528) Patient will transfer sit to/from stand:  with moderate assist  with minimal assist Goal: Pt Will Transfer Bed To Chair/Chair To Bed Outcome: Progressing Flowsheets (Taken 05/02/2023 1528) Pt will Transfer Bed to Chair/Chair to Bed:  with mod assist  with min assist   3:28 PM, 05/02/23 Ocie Bob, MPT Physical Therapist with [facility] 336 [phone] office [phone] mobile phone

## 2023-05-02 NOTE — Plan of Care (Signed)
  Problem: Education: Goal: Knowledge of General Education information will improve Description Including pain rating scale, medication(s)/side effects and non-pharmacologic comfort measures Outcome: Progressing   

## 2023-05-02 NOTE — Progress Notes (Signed)
PROGRESS NOTE  Wesley Ingram, is a 77 y.o. male, DOB - [birth year], ZDG:[id number]  Admit date - 05/01/2023   Admitting Physician Onnie Boer, MD  Outpatient Primary MD for the patient is [facility]  LOS - 1  Chief Complaint  Patient presents with   Fatigue      Brief Narrative:  77 y.o. male with medical history significant for diabetes mellitus, hypertension, stroke with left-sided hemiparesis, DVT on chronic anticoagulation admitted on 05/01/2023 with complicated UTI in the setting of chronic indwelling Foley    -Assessment and Plan: 1)Complicated UTI (urinary tract infection) - UTI complicated by the presence of chronic indwelling Foley catheter.   -Follows with urology at the [facility].  Most recent urine cultures -02/18/2023 per Care Everywhere grew E. coli, resistant to quinolones and tetracycline otherwise pansensitive. WBC 8.9 >> 10.6 -Continue IV ceftriaxone 1 g daily pending culture data -Some nausea but no further emesis at this time  2)CKD stage IIIb-- -creatinine was 2.0 on 11/18/2022 as per Care Everywhere data from [facility] -Creatinine is currently lower than recent baseline - renally adjust medications, avoid nephrotoxic agents / dehydration  / hypotension  3)Chronic anticoagulation Per care everywhere- on discharge from then hospital 02/2023- Eliquis was decreased from 5mg  bid to 2.5mg  bid given addition of aspirin. -Med rec pending, he is not sure if he is taking Eliquis, -DVT prophylaxis with heparin for now   4)History of CVA (cerebrovascular accident) CVA with chronic left-sided deficits. --Please see antiplatelet and anticoagulation discussion in #3 above  5)Hypertension associated with type 2 diabetes mellitus (HCC) Hold On Norvasc, losartan, metoprolol. - -IV labetalol as needed elevated BP  6)DM2-- A1c 6.5 reflecting good diabetic control PTA. -Hold glipizide and metformin -Use Novolog/Humalog Sliding scale insulin with  Accu-Cheks/Fingersticks as ordered   7)Social/Ethics--social work/TOC team looking into having situation to see if patient can return home safely postdischarge -Patient remains a full code  Status is: Inpatient   Disposition: The patient is from: Home              Anticipated d/c is to: Home              Anticipated d/c date is: 2 days              Patient currently is not medically stable to d/c. Barriers: Not Clinically Stable-   Code Status : -  Code Status: Full Code   Family Communication:    NA (patient is alert, awake and coherent)   DVT Prophylaxis  :   - SCDs  heparin injection 5,000 Units Start: 05/01/23 2200   Lab Results  Component Value Date   PLT 250 05/02/2023   Inpatient Medications  Scheduled Meds:  heparin injection (subcutaneous)  5,000 Units Subcutaneous Q8H   insulin aspart  0-5 Units Subcutaneous QHS   insulin aspart  0-9 Units Subcutaneous TID WC   Continuous Infusions:  sodium chloride 75 mL/hr at 05/01/23 2202   cefTRIAXone (ROCEPHIN)  IV 1 g (05/02/23 1006)   PRN Meds:.acetaminophen **OR** acetaminophen, ondansetron **OR** ondansetron (ZOFRAN) IV, polyethylene glycol   Anti-infectives (From admission, onward)    Start     Dose/Rate Route Frequency Ordered Stop   05/02/23 1500  cefTRIAXone (ROCEPHIN) 1 g in sodium chloride 0.9 % 100 mL IVPB  Status:  Discontinued        1 g 200 mL/hr over 30 Minutes Intravenous  Once 05/01/23 2146 05/02/23 0759   05/02/23 1000  cefTRIAXone (ROCEPHIN) 1 g  in sodium chloride 0.9 % 100 mL IVPB        1 g 200 mL/hr over 30 Minutes Intravenous Every 24 hours 05/02/23 0759 05/06/23 0959   05/01/23 1800  cefTRIAXone (ROCEPHIN) 1 g in sodium chloride 0.9 % 100 mL IVPB        1 g 200 mL/hr over 30 Minutes Intravenous  Once 05/01/23 1759 05/01/23 1909      Subjective: Wesley Ingram today has no fevers,   No chest pain,   Nausea but no emesis, -He would like to have his diet advanced from clear  liquids  Objective: Vitals:   05/01/23 1930 05/01/23 2202 05/02/23 0040 05/02/23 0414  BP: 118/70 (!) 168/68 137/72 120/67  Pulse:  (!) 118 (!) 108 (!) 105  Resp: 17 16 18 18   Temp:  97.6 F (36.4 C) 98.2 F (36.8 C) 98 F (36.7 C)  TempSrc:  Axillary Axillary Oral  SpO2:  96% 96% 99%  Weight:      Height:        Intake/Output Summary (Last 24 hours) at 05/02/2023 1250 Last data filed at 05/02/2023 0900 Gross per 24 hour  Intake 120.71 ml  Output 1350 ml  Net -1229.29 ml   Filed Weights   05/01/23 1451  Weight: 74.8 kg   Physical Exam Gen:- Awake Alert, in no acute distress HEENT:- [place].AT, No sclera icterus Neck-Supple Neck,No JVD,.  Lungs-  CTAB , fair symmetrical air movement CV- S1, S2 normal, regular  Abd-  +ve B.Sounds, Abd Soft, No tenderness,    Extremity/Skin:- No  edema, pedal pulses present  Psych-affect is appropriate, oriented x3 Neuro-no new focal deficits, no tremors GU-Foley with cloudy urine--Foley changed in ED on 05/01/2023  Data Reviewed: I have personally reviewed following labs and imaging studies  CBC: Recent Labs  Lab 05/01/23 1504 05/02/23 0412  WBC 8.9 10.6*  HGB 13.6 13.6  HCT 42.4 42.6  MCV 85.0 84.4  PLT 247 250   Basic Metabolic Panel: Recent Labs  Lab 05/01/23 1504 05/02/23 0412  NA 133* 136  K 3.7 3.8  CL 102 103  CO2 22 20*  GLUCOSE 220* 149*  BUN 39* 31*  CREATININE 1.82* 1.46*  CALCIUM 8.9 9.0   GFR: Estimated Creatinine Clearance: 40.9 mL/min (A) (by C-G formula based on SCr of 1.46 mg/dL (H)).  HbA1C: Recent Labs    05/02/23 0412  HGBA1C 6.5*    Radiology Studies: CT Head Wo Contrast  Result Date: 05/01/2023 CLINICAL DATA:  Head trauma, minor (Age >= 65y); Neck trauma (Age >= 65y). Weakness. Fall. Forehead abrasion. EXAM: CT HEAD WITHOUT CONTRAST CT CERVICAL SPINE WITHOUT CONTRAST TECHNIQUE: Multidetector CT imaging of the head and cervical spine was performed following the standard protocol without  intravenous contrast. Multiplanar CT image reconstructions of the cervical spine were also generated. RADIATION DOSE REDUCTION: This exam was performed according to the departmental dose-optimization program which includes automated exposure control, adjustment of the mA and/or kV according to patient size and/or use of iterative reconstruction technique. COMPARISON:  Head MRI 12/16/2017 FINDINGS: CT HEAD FINDINGS Brain: There is no evidence of an acute infarct, intracranial hemorrhage, mass, midline shift, or extra-axial fluid collection. There is mild cerebral and cerebellar atrophy. Cerebral white matter hypodensities are nonspecific but compatible with moderate chronic small vessel ischemic disease. A chronic deep white matter insult is again noted involving the right centrum semiovale and corona radiata with gliosis extending into the posterior internal capsule and with associated ex vacuo dilatation of  the right lateral ventricle. A chronic infarct is also again noted in the ventral medulla. Vascular: Calcified atherosclerosis at the skull base. Skull: No acute fracture or suspicious osseous lesion. Sinuses/Orbits: The visualized paranasal sinuses and mastoid air cells are largely clear. Unremarkable orbits. Other: None. CT CERVICAL SPINE FINDINGS Alignment: Cervical spine straightening. Mild right convex curvature of the cervical spine. Trace retrolisthesis of C3 on C4 and C4 on C5. Skull base and vertebrae: No acute fracture or destructive lesion. Soft tissues and spinal canal: No prevertebral fluid or swelling. No visible canal hematoma. Disc levels: Advanced disc degeneration from C4-5 through C6-7 with at least moderate neural foraminal stenosis at these levels, mild spinal stenosis at C6-7, and interbody ankylosis at C5-6 and C6-7. Upper chest: No mass or consolidation in the included lung apices. Other: 1.8 cm left thyroid nodule. Prominent atherosclerotic calcification of the carotid bifurcations.  IMPRESSION: 1. No evidence of acute intracranial abnormality or cervical spine fracture. 2. Moderate chronic small vessel ischemic disease. 3. 1.8 cm left thyroid nodule. Recommend non-emergent thyroid ultrasound. Reference: J Am Coll Radiol. 2015 Feb;12(2): 143-50 Electronically Signed   By: Sebastian Ache M.D.   On: 05/01/2023 16:02   CT Cervical Spine Wo Contrast  Result Date: 05/01/2023 CLINICAL DATA:  Head trauma, minor (Age >= 65y); Neck trauma (Age >= 65y). Weakness. Fall. Forehead abrasion. EXAM: CT HEAD WITHOUT CONTRAST CT CERVICAL SPINE WITHOUT CONTRAST TECHNIQUE: Multidetector CT imaging of the head and cervical spine was performed following the standard protocol without intravenous contrast. Multiplanar CT image reconstructions of the cervical spine were also generated. RADIATION DOSE REDUCTION: This exam was performed according to the departmental dose-optimization program which includes automated exposure control, adjustment of the mA and/or kV according to patient size and/or use of iterative reconstruction technique. COMPARISON:  Head MRI 12/16/2017 FINDINGS: CT HEAD FINDINGS Brain: There is no evidence of an acute infarct, intracranial hemorrhage, mass, midline shift, or extra-axial fluid collection. There is mild cerebral and cerebellar atrophy. Cerebral white matter hypodensities are nonspecific but compatible with moderate chronic small vessel ischemic disease. A chronic deep white matter insult is again noted involving the right centrum semiovale and corona radiata with gliosis extending into the posterior internal capsule and with associated ex vacuo dilatation of the right lateral ventricle. A chronic infarct is also again noted in the ventral medulla. Vascular: Calcified atherosclerosis at the skull base. Skull: No acute fracture or suspicious osseous lesion. Sinuses/Orbits: The visualized paranasal sinuses and mastoid air cells are largely clear. Unremarkable orbits. Other: None. CT  CERVICAL SPINE FINDINGS Alignment: Cervical spine straightening. Mild right convex curvature of the cervical spine. Trace retrolisthesis of C3 on C4 and C4 on C5. Skull base and vertebrae: No acute fracture or destructive lesion. Soft tissues and spinal canal: No prevertebral fluid or swelling. No visible canal hematoma. Disc levels: Advanced disc degeneration from C4-5 through C6-7 with at least moderate neural foraminal stenosis at these levels, mild spinal stenosis at C6-7, and interbody ankylosis at C5-6 and C6-7. Upper chest: No mass or consolidation in the included lung apices. Other: 1.8 cm left thyroid nodule. Prominent atherosclerotic calcification of the carotid bifurcations. IMPRESSION: 1. No evidence of acute intracranial abnormality or cervical spine fracture. 2. Moderate chronic small vessel ischemic disease. 3. 1.8 cm left thyroid nodule. Recommend non-emergent thyroid ultrasound. Reference: J Am Coll Radiol. 2015 Feb;12(2): 143-50 Electronically Signed   By: Sebastian Ache M.D.   On: 05/01/2023 16:02    Scheduled Meds:  heparin injection (subcutaneous)  5,000 Units Subcutaneous Q8H   insulin aspart  0-5 Units Subcutaneous QHS   insulin aspart  0-9 Units Subcutaneous TID WC   Continuous Infusions:  sodium chloride 75 mL/hr at 05/01/23 2202   cefTRIAXone (ROCEPHIN)  IV 1 g (05/02/23 1006)    LOS: 1 day   Shon Hale M.D on 05/02/2023 at 12:50 PM  Go to [email or web address] - for contact info  [facility] - Office  [phone]  If 7PM-7AM, please contact night-coverage [email or web address] 05/02/2023, 12:50 PM

## 2023-05-02 NOTE — Evaluation (Signed)
Physical Therapy Evaluation Patient Details Name: Wesley Ingram MRN: [id number] DOB: December 24, 1945 Today's Date: 05/02/2023  History of Present Illness  Wesley Ingram is a 77 y.o. male with medical history significant for diabetes mellitus, hypertension, stroke with left-sided hemiparesis, DVT on chronic anticoagulation.  Patient was brought to the ED from home with reports of generalized weakness over the past 2 days.  Family also noticed urine draining from Foley was milky colored.  No abdominal pain or flank pain.  Reports 1 episode of vomiting here in the ED.  Reports no change in his left-sided weakness.  No cough no difficulty breathing.  Reports falls, and hitting his head.  Reports has had an indwelling chronic Foley catheter since July this year, and his urologist is with the [place].   Clinical Impression  Patient had difficulty pulling self to sitting using bed rail due to weakness, able to stand supporting self with RUE using RW, but limited to shuffling left foot with Max tactile assistance when taking side steps and tolerated sitting up in chair after therapy - nursing staff aware.  Patient will benefit from continued skilled physical therapy in hospital and recommended venue below to increase strength, balance, endurance for safe ADLs and gait.         If plan is discharge home, recommend the following: A lot of help with walking and/or transfers;A lot of help with bathing/dressing/bathroom;Assistance with cooking/housework;Help with stairs or ramp for entrance   Can travel by private vehicle        Equipment Recommendations None recommended by PT  Recommendations for Other Services       Functional Status Assessment Patient has had a recent decline in their functional status and/or demonstrates limited ability to make significant improvements in function in a reasonable and predictable amount of time     Precautions / Restrictions Precautions Precautions:  Fall Restrictions Weight Bearing Restrictions: No      Mobility  Bed Mobility Overal bed mobility: Needs Assistance Bed Mobility: Supine to Sit     Supine to sit: Max assist, Mod assist, HOB elevated     General bed mobility comments: increased time, labored movement with HOB slightly raised    Transfers Overall transfer level: Needs assistance Equipment used: Rolling walker (2 wheels), 1 person hand held assist Transfers: Bed to chair/wheelchair/BSC, Sit to/from Stand Sit to Stand: Mod assist   Step pivot transfers: Mod assist, Max assist       General transfer comment: able to hold RW using RUE, unable to lift left foot off floor when standing    Ambulation/Gait                  Stairs            Wheelchair Mobility     Tilt Bed    Modified Rankin (Stroke Patients Only)       Balance Overall balance assessment: Needs assistance Sitting-balance support: Feet supported, No upper extremity supported Sitting balance-Leahy Scale: Fair Sitting balance - Comments: fair/good seated at EOB   Standing balance support: During functional activity, Single extremity supported Standing balance-Leahy Scale: Poor Standing balance comment: using RW with RUE                             Pertinent Vitals/Pain Pain Assessment Pain Assessment: No/denies pain    Home Living Family/patient expects to be discharged to:: Private residence Living Arrangements: Spouse/significant other;Children Available Help at Discharge:  Family;Available PRN/intermittently Type of Home: House Home Access: Ramped entrance       Home Layout: One level Home Equipment: Agricultural consultant (2 wheels);Wheelchair - manual      Prior Function Prior Level of Function : Needs assist       Physical Assist : Mobility (physical);ADLs (physical) Mobility (physical): Bed mobility;Transfers;Gait;Stairs   Mobility Comments: Assisted for transfers using Hemi-walker,  non-ambulatory, uses wheelchair for longer distances ADLs Comments: Assisted by family     Extremity/Trunk Assessment   Upper Extremity Assessment Upper Extremity Assessment: Defer to OT evaluation    Lower Extremity Assessment Lower Extremity Assessment: LLE deficits/detail LLE Deficits / Details: grossly -3/5 except ankle dorsiflexion 1/5 LLE Sensation: decreased proprioception LLE Coordination: decreased fine motor;decreased gross motor    Cervical / Trunk Assessment Cervical / Trunk Assessment: Normal  Communication   Communication Communication: No apparent difficulties Cueing Techniques: Verbal cues;Tactile cues  Cognition Arousal: Alert Behavior During Therapy: WFL for tasks assessed/performed Overall Cognitive Status: Within Functional Limits for tasks assessed                                          General Comments      Exercises     Assessment/Plan    PT Assessment Patient needs continued PT services  PT Problem List Decreased strength;Decreased activity tolerance;Decreased balance;Decreased mobility;Decreased coordination       PT Treatment Interventions Functional mobility training;Therapeutic activities;Therapeutic exercise;Balance training;Wheelchair mobility training;Patient/family education    PT Goals (Current goals can be found in the Care Plan section)  Acute Rehab PT Goals Patient Stated Goal: return home with family to assist PT Goal Formulation: With patient Time For Goal Achievement: 05/06/23 Potential to Achieve Goals: Good    Frequency Min 3X/week     Co-evaluation               AM-PAC PT "6 Clicks" Mobility  Outcome Measure Help needed turning from your back to your side while in a flat bed without using bedrails?: A Lot Help needed moving from lying on your back to sitting on the side of a flat bed without using bedrails?: A Lot Help needed moving to and from a bed to a chair (including a wheelchair)?: A  Lot Help needed standing up from a chair using your arms (e.g., wheelchair or bedside chair)?: A Lot Help needed to walk in hospital room?: Total Help needed climbing 3-5 steps with a railing? : Total 6 Click Score: 10    End of Session   Activity Tolerance: Patient tolerated treatment well;Patient limited by fatigue Patient left: in chair;with call bell/phone within reach Nurse Communication: Mobility status PT Visit Diagnosis: Unsteadiness on feet (R26.81);Other abnormalities of gait and mobility (R26.89);Muscle weakness (generalized) (M62.81)    Time: 1610-9604 PT Time Calculation (min) (ACUTE ONLY): 20 min   Charges:   PT Evaluation $PT Eval Moderate Complexity: 1 Mod PT Treatments $Therapeutic Activity: 8-22 mins PT General Charges $$ ACUTE PT VISIT: 1 Visit         3:26 PM, 05/02/23 Ocie Bob, MPT Physical Therapist with [facility] 336 [phone] office [phone] mobile phone

## 2023-05-02 NOTE — Progress Notes (Signed)
Patient has repeatedly told staff throughout shift that he needs pulled up in bed despite being at top of bed and when asked what makes him think that he needs pulled up farther, he states "I want to sit up" although he is sitting up in bed and HOB elevated as much as can be.  Prior to PT working with patient, he would call for staff, demand to be raised up, HOB laid down, raise his head again, hand him his water, get him up, lay him down, "give me the light", and was hateful with staff with his diet for breakfast and lunch.  He has also been repeatedly demanding staff get him up in the chair despite sitting in the chair and later he demanded to be laid down but immediately prior to standing stated "get me up in the chair".  Just after supper he called the hospital operator, informed them that "the nurse moved my food across the room away from me so I can't eat", operator called security, and then security came to investigate per operator's request.  Nursing staff was in room taking care of patient, patient admitted that he had said that and then apologized for saying it.  APS, social worker, and case manager all have visited with patient regarding his voiced concern about his spouse yesterday on admit.  Patient has told other staff that his "wife bitches from the time she wakes up until the time she goes to bed and I hate listening to it".

## 2023-05-02 NOTE — Plan of Care (Signed)
  Problem: Education: Goal: Knowledge of General Education information will improve Description: Including pain rating scale, medication(s)/side effects and non-pharmacologic comfort measures Outcome: Not Progressing   Problem: Activity: Goal: Risk for activity intolerance will decrease Outcome: Not Progressing   Problem: Nutrition: Goal: Adequate nutrition will be maintained Outcome: Not Progressing   

## 2023-05-03 DIAGNOSIS — N390 Urinary tract infection, site not specified: Secondary | ICD-10-CM | POA: Diagnosis not present

## 2023-05-03 DIAGNOSIS — W19XXXA Unspecified fall, initial encounter: Secondary | ICD-10-CM

## 2023-05-03 DIAGNOSIS — T83511A Infection and inflammatory reaction due to indwelling urethral catheter, initial encounter: Principal | ICD-10-CM

## 2023-05-03 LAB — GLUCOSE, CAPILLARY
Glucose-Capillary: 143 mg/dL — ABNORMAL HIGH (ref 70–99)
Glucose-Capillary: 151 mg/dL — ABNORMAL HIGH (ref 70–99)

## 2023-05-03 MED ORDER — CEPHALEXIN 500 MG PO CAPS: 500.0000 mg | ORAL_CAPSULE | Freq: Three times a day (TID) | ORAL | 0 refills | Status: AC

## 2023-05-03 MED ORDER — SENNOSIDES-DOCUSATE SODIUM 8.6-50 MG PO TABS: 2.0000 | ORAL_TABLET | Freq: Every day | ORAL | 3 refills | Status: AC

## 2023-05-03 MED ORDER — ACETAMINOPHEN 325 MG PO TABS: 650.0000 mg | ORAL_TABLET | Freq: Four times a day (QID) | ORAL | Status: DC | PRN

## 2023-05-03 NOTE — Progress Notes (Signed)
While giving discharge instructions to spouse, patient repeatedly turned on call light with this nurse standing next to him and stated "I want to stand up".  When he was informed that he needed to wait a minute as spouse was being educated regarding discharge instructions, he stated "I don't care, I want to stand up and there aren't any changes".  Daughter in room sitting behind spouse and not interactive in any discussions with patient/spouse/staff.  Spouse repeatedly states that she "was told by the [place] staff to not give him very much water because he didn't need it".  Instructed wife that patient needed to have more fluids and that was part of the reason why his urine was so thick/disgusting prior to admission.  Also instructed wife on appropriate foley care, making sure catheter was changed every month, and the rationale of having the catheter changed monthly to help decrease infection.  Wife again states she was told by the [place] she was not to give him much water.

## 2023-05-03 NOTE — TOC Transition Note (Addendum)
Transition of Care [facility]) - CM/SW Discharge Note   Patient Details  Name: Wesley Ingram MRN: [id number] Date of Birth: 1945-12-24  [facility]) CM/SW Contact:  Leitha Bleak, RN Phone Number: 05/03/2023, 12:42 PM   Clinical Narrative:   Patient discharging home with [facility], and RN for foley cath changed, orders placed, Sarah updated.     Final next level of care: Home w Home Health Services Barriers to Discharge: Barriers Resolved   Patient Goals and CMS Choice CMS Medicare.gov Compare Post Acute Care list provided to:: Patient Choice offered to / list presented to : Patient  Discharge Placement                      Patient and family notified of of transfer: 05/03/23  Discharge Plan and Services Additional resources added to the After Visit Summary for       Post Acute Care Choice: Durable Medical Equipment, Home Health                               Social Determinants of Health (SDOH) Interventions SDOH Screenings   Food Insecurity: No Food Insecurity (05/01/2023)  Housing: High Risk (05/01/2023)  Transportation Needs: No Transportation Needs (05/01/2023)  Utilities: Not At Risk (05/01/2023)  Social Connections: Unknown (11/13/2022)   Received from [facility]  Stress: No Stress Concern Present (11/14/2022)   Received from [facility]  Tobacco Use: Low Risk  (05/02/2023)     Readmission Risk Interventions    05/02/2023    2:15 PM  Readmission Risk Prevention Plan  Post Dischage Appt Not Complete  Medication Screening Complete  Transportation Screening Complete

## 2023-05-03 NOTE — Discharge Summary (Signed)
Wesley Ingram, is a 77 y.o. male  DOB 1945-08-07  MRN [id number].  Admission date:  05/01/2023  Admitting Physician  Onnie Boer, MD  Discharge Date:  05/03/2023   Primary MD  [facility]  Recommendations for primary care physician for things to follow:  1)Continue routine catheter care--May need Foley catheter changed once a month 2)BMP and CBC blood tests within 1 week 3)Avoid ibuprofen/Advil/Aleve/Motrin/Goody Powders/Naproxen/BC powders/Meloxicam/Diclofenac/Indomethacin and other Nonsteroidal anti-inflammatory medications as these will make you more likely to bleed and can cause stomach ulcers, can also cause Kidney problems.   Admission Diagnosis  Complicated UTI (urinary tract infection) [N39.0] Accident due to mechanical fall without injury, initial encounter [W19.XXXA] Urinary tract infection associated with indwelling urethral catheter, initial encounter (HCC) [G64.403K, N39.0]   Discharge Diagnosis  Complicated UTI (urinary tract infection) [N39.0] Accident due to mechanical fall without injury, initial encounter [W19.XXXA] Urinary tract infection associated with indwelling urethral catheter, initial encounter (HCC) [V42.595G, N39.0]    Principal Problem:   Complicated UTI (urinary tract infection) Active Problems:   Type 2 diabetes mellitus (HCC)   Hypertension associated with type 2 diabetes mellitus (HCC)   History of CVA (cerebrovascular accident)   Chronic anticoagulation   CKD (chronic kidney disease) stage 3, GFR 30-59 ml/min (HCC)      Past Medical History:  Diagnosis Date   Diabetes mellitus    DVT (deep venous thrombosis) (HCC)    Foot ulcer due to secondary DM (HCC)    High triglycerides    Hypertension    PVD (peripheral vascular disease) (HCC)    Renal disorder    Stroke (HCC) 2008   left hemiparesis    Past Surgical History:  Procedure Laterality  Date   AMPUTATION Right 01/25/2019   Procedure: AMPUTATION RIGHT GREAT TOE;  Surgeon: Nada Libman, MD;  Location: [facility] OR;  Service: Vascular;  Laterality: Right;   COLONOSCOPY  2009   [facility] in [place]: diverticulosis, next TCS in 07/2017   CYSTOSCOPY WITH RETROGRADE PYELOGRAM, URETEROSCOPY AND STENT PLACEMENT Right 11/19/2016   Procedure: CYSTOSCOPY WITH RETROGRADE PYELOGRAM, URETEROSCOPY AND STENT EXCHANGE;  Surgeon: Sebastian Ache, MD;  Location: [facility]S;  Service: Urology;  Laterality: Right;   CYSTOSCOPY WITH STENT PLACEMENT Right 10/19/2016   Procedure: CYSTOSCOPY WITH STENT PLACEMENT;  Surgeon: Sebastian Ache, MD;  Location: [facility];  Service: Urology;  Laterality: Right;   ENDARTERECTOMY     Left popliteal artery endarterectomy with left above-knee popliteal artery to tibial peroneal trunk bypass with reverse saphenous vein   HERNIA REPAIR     bilateral inguinal   HOLMIUM LASER APPLICATION Right 11/19/2016   Procedure: HOLMIUM LASER APPLICATION;  Surgeon: Sebastian Ache, MD;  Location: [facility];  Service: Urology;  Laterality: Right;   TOE AMPUTATION     TONSILLECTOMY        HPI  from the history and physical done on the day of admission:    Chief Complaint:  Weakness, change in urine colour   HPI: Wesley Ingram is a  77 y.o. male with medical history significant for diabetes mellitus, hypertension, stroke with left-sided hemiparesis, DVT on chronic anticoagulation. Patient was brought to the ED from home with reports of generalized weakness over the past 2 days.  Family also noticed urine draining from Foley was milky colored.  No abdominal pain or flank pain.  Reports 1 episode of vomiting here in the ED.  Reports no change in his left-sided weakness.  No cough no difficulty breathing.  Reports falls, and hitting his head. Reports has had an indwelling chronic Foley catheter since July this year, and his urologist is with the [place].   ED Course: Tmax 99.2 heart rate  91-98.  Respiratory rate 14-21.  Blood pressure systolic 118-147.  O2 sat 100% on room air. UA suggestive of UTI. Creatinine close to baseline at 1.8-Per Care Everywhere. CT head, CT cervical spine negative for acute abnormality. IV ceftriaxone 1 g given. Patient became nauseous in the ED and vomited, hence hospitalization requested.   Review of Systems: As per HPI all other systems reviewed and negative.    Hospital Course:    Assessment and Plan: Brief Narrative:  77 y.o. male with medical history significant for diabetes mellitus, hypertension, stroke with left-sided hemiparesis, DVT on chronic anticoagulation admitted on 05/01/2023 with complicated UTI in the setting of chronic indwelling Foley     -Assessment and Plan: 1)Complicated CAUTI--POA - UTI complicated by the presence of chronic indwelling Foley catheter.   -Follows with urology at the [facility].  Most recent urine cultures -02/18/2023 per Care Everywhere grew E. coli, resistant to quinolones and tetracycline otherwise pansensitive. -Treated with IV Rocephin with significant clinical improvement -Discharge on Keflex -Urine culture still without growth at time of discharge -Consider changing Foley catheter once a month at home   2)CKD stage IIIb-- -creatinine was 2.0 on 11/18/2022 as per Care Everywhere data from [facility] -Creatinine is currently lower than recent baseline based on Care Everywhere records -Avoid NSAIDs    3)Chronic anticoagulation Per care everywhere- on discharge from then hospital 02/2023- Eliquis was decreased from 5mg  bid to 2.5mg  bid given addition of aspirin. -Resume PTA regimen    4)History of CVA (cerebrovascular accident) CVA with chronic left-sided deficits. --Please see antiplatelet and anticoagulation discussion in #3 above   5)Hypertension associated with type 2 diabetes mellitus (HCC) Okay to resume PTA antihypertensive regimen   6)DM2-- A1c 6.5 reflecting good diabetic control  PTA. -Resume PTA diabetic regimen   7)Social/Ethics--As per social work/TOC team patient can return home safely postdischarge -Patient remains a full code     Disposition: The patient is from: Home              Anticipated d/c is to: Home   Discharge Condition: stable  Follow UP   Follow-up Information     [facility] Follow up.   Why: PT will call to schedule your next home visit.               Diet and Activity recommendation:  As advised  Discharge Instructions    Discharge Instructions     Call MD for:  difficulty breathing, headache or visual disturbances   Complete by: As directed    Call MD for:  persistant dizziness or light-headedness   Complete by: As directed    Call MD for:  persistant nausea and vomiting   Complete by: As directed    Call MD for:  temperature >100.4   Complete by: As directed    Diet -  low sodium heart healthy   Complete by: As directed    Discharge instructions   Complete by: As directed    1)Continue routine catheter care--May need Foley catheter changed once a month 2)BMP and CBC blood tests within 1 week 3)Avoid ibuprofen/Advil/Aleve/Motrin/Goody Powders/Naproxen/BC powders/Meloxicam/Diclofenac/Indomethacin and other Nonsteroidal anti-inflammatory medications as these will make you more likely to bleed and can cause stomach ulcers, can also cause Kidney problems.   Increase activity slowly   Complete by: As directed         Discharge Medications     Allergies as of 05/03/2023   No Known Allergies      Medication List     STOP taking these medications    Acidophilus 0.5 MG Tabs   feeding supplement Liqd   Loperamide A-D 2 MG tablet Generic drug: loperamide   melatonin 3 MG Tabs tablet   metoprolol tartrate 50 MG tablet Commonly known as: LOPRESSOR   tamsulosin 0.4 MG Caps capsule Commonly known as: FLOMAX       TAKE these medications    acetaminophen 325 MG tablet Commonly known as:  TYLENOL Take 2 tablets (650 mg total) by mouth every 6 (six) hours as needed for mild pain (pain score 1-3) (or Fever >/= 101).   Alogliptin Benzoate 25 MG Tabs Take by mouth as needed. Hold Metformin when he takes it.   amLODipine 2.5 MG tablet Commonly known as: NORVASC Take 1 tablet (2.5 mg total) by mouth daily. What changed: how much to take   apixaban 5 MG Tabs tablet Commonly known as: ELIQUIS Take 1 tablet (5 mg total) by mouth 2 (two) times daily.   aspirin 81 MG chewable tablet Chew by mouth.   atorvastatin 20 MG tablet Commonly known as: LIPITOR Take 1 tablet (20 mg total) by mouth every morning. What changed:  how much to take when to take this   b complex vitamins tablet Take 1 tablet by mouth 2 (two) times daily.   baclofen 10 MG tablet Commonly known as: LIORESAL Take by mouth.   cephALEXin 500 MG capsule Commonly known as: Keflex Take 1 capsule (500 mg total) by mouth 3 (three) times daily for 5 days.   clopidogrel 75 MG tablet Commonly known as: PLAVIX Take by mouth.   empagliflozin 25 MG Tabs tablet Commonly known as: JARDIANCE Take by mouth.   finasteride 5 MG tablet Commonly known as: PROSCAR Take by mouth.   glipiZIDE 5 MG tablet Commonly known as: GLUCOTROL Take 0.5 tablets (2.5 mg total) by mouth 2 (two) times daily before a meal.   guaiFENesin 600 MG 12 hr tablet Commonly known as: MUCINEX Take by mouth.   losartan 100 MG tablet Commonly known as: COZAAR Take 1 tablet (100 mg total) by mouth daily.   metFORMIN 1000 MG tablet Commonly known as: GLUCOPHAGE Take 1 tablet (1,000 mg total) by mouth 2 (two) times daily with a meal.   metoprolol succinate 25 MG 24 hr tablet Commonly known as: TOPROL-XL Take 0.5 tablets (12.5 mg total) by mouth daily. What changed:  how much to take additional instructions   NON FORMULARY Diet Type: Regular, NAS, Consistent Carbohydrate   Onglyza 5 MG Tabs tablet Generic drug: saxagliptin  HCl Take by mouth.   Polyvinyl Alcohol-Povidone PF 1.4-0.6 % Soln Place 1 drop into both eyes 2 (two) times daily. For dry eyes   senna-docusate 8.6-50 MG tablet Commonly known as: Senokot-S Take 2 tablets by mouth at bedtime. What changed:  how much to  take when to take this       Major procedures and Radiology Reports - PLEASE review detailed and final reports for all details, in brief -   CT Head Wo Contrast  Result Date: 05/01/2023 CLINICAL DATA:  Head trauma, minor (Age >= 65y); Neck trauma (Age >= 65y). Weakness. Fall. Forehead abrasion. EXAM: CT HEAD WITHOUT CONTRAST CT CERVICAL SPINE WITHOUT CONTRAST TECHNIQUE: Multidetector CT imaging of the head and cervical spine was performed following the standard protocol without intravenous contrast. Multiplanar CT image reconstructions of the cervical spine were also generated. RADIATION DOSE REDUCTION: This exam was performed according to the departmental dose-optimization program which includes automated exposure control, adjustment of the mA and/or kV according to patient size and/or use of iterative reconstruction technique. COMPARISON:  Head MRI 12/16/2017 FINDINGS: CT HEAD FINDINGS Brain: There is no evidence of an acute infarct, intracranial hemorrhage, mass, midline shift, or extra-axial fluid collection. There is mild cerebral and cerebellar atrophy. Cerebral white matter hypodensities are nonspecific but compatible with moderate chronic small vessel ischemic disease. A chronic deep white matter insult is again noted involving the right centrum semiovale and corona radiata with gliosis extending into the posterior internal capsule and with associated ex vacuo dilatation of the right lateral ventricle. A chronic infarct is also again noted in the ventral medulla. Vascular: Calcified atherosclerosis at the skull base. Skull: No acute fracture or suspicious osseous lesion. Sinuses/Orbits: The visualized paranasal sinuses and mastoid air  cells are largely clear. Unremarkable orbits. Other: None. CT CERVICAL SPINE FINDINGS Alignment: Cervical spine straightening. Mild right convex curvature of the cervical spine. Trace retrolisthesis of C3 on C4 and C4 on C5. Skull base and vertebrae: No acute fracture or destructive lesion. Soft tissues and spinal canal: No prevertebral fluid or swelling. No visible canal hematoma. Disc levels: Advanced disc degeneration from C4-5 through C6-7 with at least moderate neural foraminal stenosis at these levels, mild spinal stenosis at C6-7, and interbody ankylosis at C5-6 and C6-7. Upper chest: No mass or consolidation in the included lung apices. Other: 1.8 cm left thyroid nodule. Prominent atherosclerotic calcification of the carotid bifurcations. IMPRESSION: 1. No evidence of acute intracranial abnormality or cervical spine fracture. 2. Moderate chronic small vessel ischemic disease. 3. 1.8 cm left thyroid nodule. Recommend non-emergent thyroid ultrasound. Reference: J Am Coll Radiol. 2015 Feb;12(2): 143-50 Electronically Signed   By: Sebastian Ache M.D.   On: 05/01/2023 16:02   CT Cervical Spine Wo Contrast  Result Date: 05/01/2023 CLINICAL DATA:  Head trauma, minor (Age >= 65y); Neck trauma (Age >= 65y). Weakness. Fall. Forehead abrasion. EXAM: CT HEAD WITHOUT CONTRAST CT CERVICAL SPINE WITHOUT CONTRAST TECHNIQUE: Multidetector CT imaging of the head and cervical spine was performed following the standard protocol without intravenous contrast. Multiplanar CT image reconstructions of the cervical spine were also generated. RADIATION DOSE REDUCTION: This exam was performed according to the departmental dose-optimization program which includes automated exposure control, adjustment of the mA and/or kV according to patient size and/or use of iterative reconstruction technique. COMPARISON:  Head MRI 12/16/2017 FINDINGS: CT HEAD FINDINGS Brain: There is no evidence of an acute infarct, intracranial hemorrhage, mass,  midline shift, or extra-axial fluid collection. There is mild cerebral and cerebellar atrophy. Cerebral white matter hypodensities are nonspecific but compatible with moderate chronic small vessel ischemic disease. A chronic deep white matter insult is again noted involving the right centrum semiovale and corona radiata with gliosis extending into the posterior internal capsule and with associated ex vacuo dilatation of  the right lateral ventricle. A chronic infarct is also again noted in the ventral medulla. Vascular: Calcified atherosclerosis at the skull base. Skull: No acute fracture or suspicious osseous lesion. Sinuses/Orbits: The visualized paranasal sinuses and mastoid air cells are largely clear. Unremarkable orbits. Other: None. CT CERVICAL SPINE FINDINGS Alignment: Cervical spine straightening. Mild right convex curvature of the cervical spine. Trace retrolisthesis of C3 on C4 and C4 on C5. Skull base and vertebrae: No acute fracture or destructive lesion. Soft tissues and spinal canal: No prevertebral fluid or swelling. No visible canal hematoma. Disc levels: Advanced disc degeneration from C4-5 through C6-7 with at least moderate neural foraminal stenosis at these levels, mild spinal stenosis at C6-7, and interbody ankylosis at C5-6 and C6-7. Upper chest: No mass or consolidation in the included lung apices. Other: 1.8 cm left thyroid nodule. Prominent atherosclerotic calcification of the carotid bifurcations. IMPRESSION: 1. No evidence of acute intracranial abnormality or cervical spine fracture. 2. Moderate chronic small vessel ischemic disease. 3. 1.8 cm left thyroid nodule. Recommend non-emergent thyroid ultrasound. Reference: J Am Coll Radiol. 2015 Feb;12(2): 143-50 Electronically Signed   By: Sebastian Ache M.D.   On: 05/01/2023 16:02    Micro Results   Recent Results (from the past 240 hour(s))  Urine Culture     Status: None (Preliminary result)   Collection Time: 05/01/23  4:36 PM    Specimen: Urine, Clean Catch  Result Value Ref Range Status   Specimen Description   Final    URINE, CLEAN CATCH Performed at [facility], [address], [place], [place] [address]    Special Requests   Final    NONE Performed at [facility], [address], [place], [place] [address]    Culture   Final    CULTURE REINCUBATED FOR BETTER GROWTH Performed at [facility], [address] [place], [place] [address]    Report Status PENDING  Incomplete   Today   Subjective    Wesley Ingram today has no new complaints  -No fever  Or chills   No Nausea, Vomiting or Diarrhea      Patient has been seen and examined prior to discharge   Objective   Blood pressure 127/77, pulse 95, temperature 98.5 F (36.9 C), temperature source Oral, resp. rate 16, height 5\' 7"  (1.702 m), weight 74.8 kg, SpO2 96%.   Intake/Output Summary (Last 24 hours) at 05/03/2023 1248 Last data filed at 05/03/2023 0819 Gross per 24 hour  Intake 820 ml  Output 3850 ml  Net -3030 ml    Exam Gen:- Awake Alert, in no acute distress HEENT:- [place].AT, No sclera icterus Neck-Supple Neck,No JVD,.  Lungs-  CTAB , fair symmetrical air movement CV- S1, S2 normal, regular  Abd-  +ve B.Sounds, Abd Soft, No tenderness,    Extremity/Skin:- No  edema, pedal pulses present  Psych-affect is appropriate, oriented x3 Neuro-no new focal deficits, no tremors GU-chronic indwelling Foley --Foley changed in ED on 05/03/2023 prior to discharge   Data Review   CBC w Diff:  Lab Results  Component Value Date   WBC 10.6 (H) 05/02/2023   HGB 13.6 05/02/2023   HCT 42.6 05/02/2023   PLT 250 05/02/2023   LYMPHOPCT 20 01/21/2019   MONOPCT 14 01/21/2019   EOSPCT 7 01/21/2019   BASOPCT 1 01/21/2019    CMP:  Lab Results  Component Value Date   NA 136 05/02/2023   K 3.8 05/02/2023   CL 103 05/02/2023   CO2 20 (  L) 05/02/2023   BUN 31 (H) 05/02/2023   CREATININE 1.46 (H) 05/02/2023   PROT 8.1 10/19/2016    ALBUMIN 4.3 10/19/2016   BILITOT 0.9 10/19/2016   ALKPHOS 52 10/19/2016   AST 25 10/19/2016   ALT 17 10/19/2016  .  Total Discharge time is about 33 minutes  Shon Hale M.D on 05/03/2023 at 12:48 PM  Go to [email or web address] -  for contact info  [facility] - Office  [phone]

## 2023-05-03 NOTE — Discharge Instructions (Signed)
1)Continue routine catheter care--May need Foley catheter changed once a month 2)BMP and CBC blood tests within 1 week 3)Avoid ibuprofen/Advil/Aleve/Motrin/Goody Powders/Naproxen/BC powders/Meloxicam/Diclofenac/Indomethacin and other Nonsteroidal anti-inflammatory medications as these will make you more likely to bleed and can cause stomach ulcers, can also cause Kidney problems.

## 2023-05-05 LAB — URINE CULTURE: Culture: 80000 — AB

## 2023-06-30 DIAGNOSIS — R109 Unspecified abdominal pain: Secondary | ICD-10-CM | POA: Diagnosis not present

## 2023-08-12 ENCOUNTER — Encounter (HOSPITAL_COMMUNITY): Payer: Self-pay | Admitting: *Deleted

## 2023-08-12 ENCOUNTER — Other Ambulatory Visit: Payer: Self-pay

## 2023-08-12 ENCOUNTER — Emergency Department (HOSPITAL_COMMUNITY)
Admission: EM | Admit: 2023-08-12 | Discharge: 2023-08-12 | Disposition: A | Discharge status: Home / Self Care | Payer: No Typology Code available for payment source | Attending: Emergency Medicine | Admitting: Emergency Medicine

## 2023-08-12 DIAGNOSIS — Z7982 Long term (current) use of aspirin: Secondary | ICD-10-CM | POA: Diagnosis not present

## 2023-08-12 DIAGNOSIS — R531 Weakness: Secondary | ICD-10-CM | POA: Diagnosis not present

## 2023-08-12 DIAGNOSIS — B9689 Other specified bacterial agents as the cause of diseases classified elsewhere: Secondary | ICD-10-CM | POA: Insufficient documentation

## 2023-08-12 DIAGNOSIS — T83511A Infection and inflammatory reaction due to indwelling urethral catheter, initial encounter: Secondary | ICD-10-CM | POA: Insufficient documentation

## 2023-08-12 DIAGNOSIS — R6889 Other general symptoms and signs: Secondary | ICD-10-CM | POA: Diagnosis not present

## 2023-08-12 DIAGNOSIS — Z743 Need for continuous supervision: Secondary | ICD-10-CM | POA: Diagnosis not present

## 2023-08-12 DIAGNOSIS — N390 Urinary tract infection, site not specified: Secondary | ICD-10-CM | POA: Insufficient documentation

## 2023-08-12 LAB — CBC WITH DIFFERENTIAL/PLATELET
Abs Immature Granulocytes: 0.1 10*3/uL — ABNORMAL HIGH (ref 0.00–0.07)
Basophils Absolute: 0.1 10*3/uL (ref 0.0–0.1)
Basophils Relative: 1 %
Eosinophils Absolute: 0.1 10*3/uL (ref 0.0–0.5)
Eosinophils Relative: 1 %
HCT: 42 % (ref 39.0–52.0)
Hemoglobin: 13.5 g/dL (ref 13.0–17.0)
Immature Granulocytes: 1 %
Lymphocytes Relative: 11 %
Lymphs Abs: 1.4 10*3/uL (ref 0.7–4.0)
MCH: 28.3 pg (ref 26.0–34.0)
MCHC: 32.1 g/dL (ref 30.0–36.0)
MCV: 88.1 fL (ref 80.0–100.0)
Monocytes Absolute: 2.2 10*3/uL — ABNORMAL HIGH (ref 0.1–1.0)
Monocytes Relative: 17 %
Neutro Abs: 9.2 10*3/uL — ABNORMAL HIGH (ref 1.7–7.7)
Neutrophils Relative %: 69 %
Platelets: 214 10*3/uL (ref 150–400)
RBC: 4.77 MIL/uL (ref 4.22–5.81)
RDW: 14.8 % (ref 11.5–15.5)
WBC: 13 10*3/uL — ABNORMAL HIGH (ref 4.0–10.5)
nRBC: 0 % (ref 0.0–0.2)

## 2023-08-12 LAB — COMPREHENSIVE METABOLIC PANEL
ALT: 12 U/L (ref 0–44)
AST: 11 U/L — ABNORMAL LOW (ref 15–41)
Albumin: 3.4 g/dL — ABNORMAL LOW (ref 3.5–5.0)
Alkaline Phosphatase: 64 U/L (ref 38–126)
Anion gap: 10 (ref 5–15)
BUN: 29 mg/dL — ABNORMAL HIGH (ref 8–23)
CO2: 23 mmol/L (ref 22–32)
Calcium: 8.9 mg/dL (ref 8.9–10.3)
Chloride: 102 mmol/L (ref 98–111)
Creatinine, Ser: 1.67 mg/dL — ABNORMAL HIGH (ref 0.61–1.24)
GFR, Estimated: 42 mL/min — ABNORMAL LOW (ref >60)
Glucose, Bld: 174 mg/dL — ABNORMAL HIGH (ref 70–99)
Potassium: 4 mmol/L (ref 3.5–5.1)
Sodium: 135 mmol/L (ref 135–145)
Total Bilirubin: 0.8 mg/dL (ref 0.0–1.2)
Total Protein: 7.3 g/dL (ref 6.5–8.1)

## 2023-08-12 LAB — URINALYSIS, W/ REFLEX TO CULTURE (INFECTION SUSPECTED)
Bilirubin Urine: NEGATIVE
Glucose, UA: >=500 mg/dL — AB
Ketones, ur: NEGATIVE mg/dL
Nitrite: NEGATIVE
Protein, ur: 100 mg/dL — AB
RBC / HPF: >50 RBC/hpf (ref 0–5)
Specific Gravity, Urine: 1.02 (ref 1.005–1.030)
WBC, UA: >50 WBC/hpf (ref 0–5)
pH: 5 (ref 5.0–8.0)

## 2023-08-12 LAB — TROPONIN I (HIGH SENSITIVITY): Troponin I (High Sensitivity): 8 ng/L (ref <18)

## 2023-08-12 MED ORDER — SODIUM CHLORIDE 0.9 % IV SOLN
1.0000 g | Freq: Once | INTRAVENOUS | Status: AC
  Administered 2023-08-12: 1 g via INTRAVENOUS
  Filled 2023-08-12: qty 10

## 2023-08-12 MED ORDER — CEPHALEXIN 500 MG PO CAPS: 500.0000 mg | ORAL_CAPSULE | Freq: Two times a day (BID) | ORAL | 0 refills | Status: AC

## 2023-08-12 MED ORDER — SODIUM CHLORIDE 0.9 % IV BOLUS
1000.0000 mL | Freq: Once | INTRAVENOUS | Status: AC
  Administered 2023-08-12: 1000 mL via INTRAVENOUS

## 2023-08-12 NOTE — Discharge Instructions (Signed)
 We are putting you on 7-day of antibiotics for your urinary tract infection.  Take these until completed unless you develop side effects or allergic reactions.  If you develop fever, vomiting, or any other new/concerning symptoms then return to the ER or call 911.

## 2023-08-12 NOTE — ED Triage Notes (Addendum)
 Pt brought in by [facility] from home with c/o possible UTI. Family concerned pt has a UTI due to how his urine looks in his chronic foley. They report to EMS that pt has hx of several UTIs. Pt reports feeling weak. BP 112/71 and HR 80 for EMS.

## 2023-08-12 NOTE — ED Provider Notes (Signed)
 [place] EMERGENCY DEPARTMENT AT [facility] Provider Note   CSN: [id number] Arrival date & time: 08/12/23  0945     History  Chief Complaint  Patient presents with   Weakness    Wesley Ingram is a 78 y.o. male.  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  Symptoms started yesterday.  He has chronic left-sided deficits but no new unilateral weakness.  No headache, fever, chest pain or shortness of breath.  Urine looks dark and he states this is how his UTIs always are.  Had a UTI last month.  His catheter was changed a week or so ago.  Home Medications Prior to Admission medications   Medication Sig Start Date End Date Taking? Authorizing Provider  cephALEXin (KEFLEX) 500 MG capsule Take 1 capsule (500 mg total) by mouth 2 (two) times daily for 7 days. 08/12/23 08/19/23 Yes Pricilla Loveless, MD  acetaminophen (TYLENOL) 325 MG tablet Take 2 tablets (650 mg total) by mouth every 6 (six) hours as needed for mild pain (pain score 1-3) (or Fever >/= 101). 05/03/23   Shon Hale, MD  Alogliptin Benzoate 25 MG TABS Take by mouth as needed. Hold Metformin when he takes it.    [provider]  amLODipine (NORVASC) 2.5 MG tablet Take 1 tablet (2.5 mg total) by mouth daily. Patient taking differently: Take 5 mg by mouth daily.  02/15/19   Sharee Holster, NP  apixaban (ELIQUIS) 5 MG TABS tablet Take 1 tablet (5 mg total) by mouth 2 (two) times daily. 02/15/19   Sharee Holster, NP  aspirin 81 MG chewable tablet Chew by mouth. 11/08/14   [provider]  atorvastatin (LIPITOR) 20 MG tablet Take 1 tablet (20 mg total) by mouth every morning. Patient taking differently: Take 10 mg by mouth daily.  02/15/19   Sharee Holster, NP  b complex vitamins tablet Take 1 tablet by mouth 2 (two) times daily.    [provider]  baclofen (LIORESAL) 10 MG tablet Take by mouth.  11/08/14   [provider]  clopidogrel (PLAVIX) 75 MG tablet Take by mouth. 11/08/14   [provider]  empagliflozin (JARDIANCE) 25 MG TABS tablet Take by mouth. 01/13/22   [provider]  finasteride (PROSCAR) 5 MG tablet Take by mouth. 11/08/14   [provider]  glipiZIDE (GLUCOTROL) 5 MG tablet Take 0.5 tablets (2.5 mg total) by mouth 2 (two) times daily before a meal. 02/15/19   Chilton Si, Chong Sicilian, NP  guaiFENesin (MUCINEX) 600 MG 12 hr tablet Take by mouth. 12/24/22   [provider]  losartan (COZAAR) 100 MG tablet Take 1 tablet (100 mg total) by mouth daily. 02/15/19   Sharee Holster, NP  metFORMIN (GLUCOPHAGE) 1000 MG tablet Take 1 tablet (1,000 mg total) by mouth 2 (two) times daily with a meal. 02/15/19   Chilton Si, Chong Sicilian, NP  metoprolol succinate (TOPROL-XL) 25 MG 24 hr tablet Take 0.5 tablets (12.5 mg total) by mouth daily. Patient taking differently: Take 50 mg by mouth daily. Takes 1/2 tablet daily. 02/15/19   Sharee Holster, NP  NON FORMULARY Diet Type: Regular, NAS, Consistent Carbohydrate Patient not taking: Reported on 03/04/2020 01/29/19   [provider]  Polyvinyl Alcohol-Povidone PF 1.4-0.6 % SOLN Place 1 drop into both eyes 2 (two) times daily. For dry eyes    [provider]  saxagliptin HCl (  ONGLYZA) 5 MG TABS tablet Take by mouth. 11/08/14   [provider]  senna-docusate (SENOKOT-S) 8.6-50 MG tablet Take 2 tablets by mouth at bedtime. 05/03/23   Shon Hale, MD      Allergies    Patient has no known allergies.    Review of Systems   Review of Systems  Constitutional:  Negative for fever.  Respiratory:  Negative for shortness of breath.   Cardiovascular:  Negative for chest pain.  Gastrointestinal:  Negative for abdominal pain.  Neurological:  Positive for weakness. Negative for headaches.    Physical Exam Updated Vital Signs BP (!) 130/56   Pulse 80   Temp 97.8 F (36.6 C) (Oral)   Resp 18    Ht 5\' 10"  (1.778 m)   Wt 70.3 kg   SpO2 97%   BMI 22.24 kg/m  Physical Exam Vitals and nursing note reviewed.  Constitutional:      Appearance: He is well-developed.  HENT:     Head: Normocephalic and atraumatic.  Cardiovascular:     Rate and Rhythm: Normal rate and regular rhythm.     Heart sounds: Normal heart sounds.  Pulmonary:     Effort: Pulmonary effort is normal.     Breath sounds: Normal breath sounds.  Abdominal:     General: There is no distension.     Palpations: Abdomen is soft.     Tenderness: There is no abdominal tenderness.  Genitourinary:    Comments: Dark urine in foley catheter bag Skin:    General: Skin is warm and dry.  Neurological:     Mental Status: He is alert.     Comments: 5/5 strength in RUE, RLE, chronically weak LUE, LLE (with contracture in left upper extremity).     ED Results / Procedures / Treatments   Labs (all labs ordered are listed, but only abnormal results are displayed) Labs Reviewed  COMPREHENSIVE METABOLIC PANEL - Abnormal; Notable for the following components:      Result Value   Glucose, Bld 174 (*)    BUN 29 (*)    Creatinine, Ser 1.67 (*)    Albumin 3.4 (*)    AST 11 (*)    GFR, Estimated 42 (*)    All other components within normal limits  CBC WITH DIFFERENTIAL/PLATELET - Abnormal; Notable for the following components:   WBC 13.0 (*)    Neutro Abs 9.2 (*)    Monocytes Absolute 2.2 (*)    Abs Immature Granulocytes 0.10 (*)    All other components within normal limits  URINALYSIS, W/ REFLEX TO CULTURE (INFECTION SUSPECTED) - Abnormal; Notable for the following components:   APPearance TURBID (*)    Glucose, UA >=500 (*)    Hgb urine dipstick MODERATE (*)    Protein, ur 100 (*)    Leukocytes,Ua LARGE (*)    Bacteria, UA RARE (*)    All other components within normal limits  URINE CULTURE  TROPONIN I (HIGH SENSITIVITY)    EKG None  Radiology No results found.  Procedures Procedures    Medications  Ordered in ED Medications  sodium chloride 0.9 % bolus 1,000 mL (0 mLs Intravenous Stopped 08/12/23 1323)  cefTRIAXone (ROCEPHIN) 1 g in sodium chloride 0.9 % 100 mL IVPB (0 g Intravenous Stopped 08/12/23 1356)    ED Course/ Medical Decision Making/ A&P  Medical Decision Making Amount and/or Complexity of Data Reviewed External Data Reviewed: labs. Labs: ordered.    Details: Creatinine is stable compared to most recent value in Care Everywhere.  Mild leukocytosis ECG/medicine tests: ordered and independent interpretation performed.    Details: No ischemia  Risk Prescription drug management.   Patient with a recurrent UTI, likely associated with his catheter.  Catheter was changed.  Vital signs have been reassuring.  He had 1 blood pressure noted in the 90s but no others I suspect this was a falsely low value.  He has had previous treatment for a similar complaint and last time he was here he was treated with Keflex and did well after Rocephin.  Given dose of Rocephin and discharged on Keflex.  Creatinine clearance is around 37.  Discussed all this with patient and wife.  He appears stable for discharge.  Low suspicion for pyelonephritis or sepsis.        Final Clinical Impression(s) / ED Diagnoses Final diagnoses:  Urinary tract infection associated with indwelling urethral catheter, initial encounter (HCC)    Rx / DC Orders ED Discharge Orders          Ordered    cephALEXin (KEFLEX) 500 MG capsule  2 times daily        08/12/23 1348              Pricilla Loveless, MD 08/12/23 1533

## 2023-08-15 LAB — URINE CULTURE

## 2023-08-16 ENCOUNTER — Telehealth (HOSPITAL_BASED_OUTPATIENT_CLINIC_OR_DEPARTMENT_OTHER): Payer: Self-pay

## 2023-08-16 NOTE — Telephone Encounter (Signed)
 Arvid Right 08/16/2023, 11:47 [facility] ED Visit - Positive Culture Follow-up: Chart Hand-off to ED Flow Manager  Culture assessed and recommendations reviewed by: []  Isaac Bliss, Pharm.D., BCPS []  Celedonio Miyamoto, Pharm.D., BCPS-AQ ID []  Georgina Pillion, Pharm.D., BCPS []  [place], [address].D., BCPS, AAHIVP []  Estella Husk, Pharm .D., BCPS, AAHIVP []  Tennis Must, Pharm.D. []  Casilda Carls, Pharm.DOtelia Limes, PharmD Positive Urine culture  []  Patient discharged without antimicrobial prescription and treatment is now indicated []  Organism is resistant to prescribed ED discharge antimicrobial []  Patient with positive blood cultures   Changes discussed with ED provider: Dr Cristal Deer Tegeler "Call to check on symptoms.  If present start Macrobid 100 mg BID x 7 days" New antibiotic prescription "Macrobid 100 mg BID x 7 days"  08/16/23 1150 LVM requesting callback. Letter sent to [facility] Address.   Arvid Right 08/16/2023, 11:47 AM

## 2023-08-16 NOTE — Progress Notes (Signed)
 ED Antimicrobial Stewardship Positive Culture Follow Up   Wesley Ingram is an 78 y.o. male who presented to [facility] on 08/12/2023 with a chief complaint of  Chief Complaint  Patient presents with   Weakness    Recent Results (from the past 720 hours)  Urine Culture     Status: Abnormal   Collection Time: 08/12/23 12:09 PM   Specimen: In/Out Cath Urine  Result Value Ref Range Status   Specimen Description   Final    IN/OUT CATH URINE Performed at [facility], [address] [place], [place] [address]    Special Requests   Final    NONE Reflexed from [id number] Performed at [facility], [address] [place], [place] [address]    Culture (A)  Final    >=100,000 COLONIES/mL KLEBSIELLA OXYTOCA 80,000 COLONIES/mL ENTEROCOCCUS FAECALIS    Report Status 08/15/2023 FINAL  Final   Organism ID, Bacteria KLEBSIELLA OXYTOCA (A)  Final   Organism ID, Bacteria ENTEROCOCCUS FAECALIS (A)  Final      Susceptibility   Enterococcus faecalis - MIC*    AMPICILLIN <=2 SENSITIVE Sensitive     NITROFURANTOIN <=16 SENSITIVE Sensitive     VANCOMYCIN 1 SENSITIVE Sensitive     * 80,000 COLONIES/mL ENTEROCOCCUS FAECALIS   Klebsiella oxytoca - MIC*    AMPICILLIN RESISTANT Resistant     CEFEPIME <=0.12 SENSITIVE Sensitive     CEFTRIAXONE <=0.25 SENSITIVE Sensitive     CIPROFLOXACIN <=0.25 SENSITIVE Sensitive     GENTAMICIN <=1 SENSITIVE Sensitive     IMIPENEM <=0.25 SENSITIVE Sensitive     NITROFURANTOIN <=16 SENSITIVE Sensitive     TRIMETH/SULFA <=20 SENSITIVE Sensitive     AMPICILLIN/SULBACTAM <=2 SENSITIVE Sensitive     PIP/TAZO <=4 SENSITIVE Sensitive ug/mL    * >=100,000 COLONIES/mL KLEBSIELLA OXYTOCA    [x]  Treated with keflex, organism resistant to prescribed antimicrobial  New antibiotic prescription: Only if symptoms presists, macrobid 100mg  BID x7  ED Provider: Michele Mcalpine Angeliyah Kirkey 08/16/2023, 10:50 AM Infectious Diseases Pharmacist Phone# [phone]

## 2023-08-16 NOTE — Telephone Encounter (Signed)
 08-17-23 @ 1312 Spoke with husband, given permission to speak with his wife regarding results.  Wife reports pt better (urine light, not weak and no fever).  Letter discarded. Rx for antibiotic not called in to any pharmacy

## 2023-09-17 ENCOUNTER — Emergency Department (HOSPITAL_COMMUNITY)
Admission: EM | Admit: 2023-09-17 | Discharge: 2023-09-17 | Disposition: A | Discharge status: Home / Self Care | Attending: Emergency Medicine | Admitting: Emergency Medicine

## 2023-09-17 ENCOUNTER — Encounter (HOSPITAL_COMMUNITY): Payer: Self-pay

## 2023-09-17 ENCOUNTER — Other Ambulatory Visit: Payer: Self-pay

## 2023-09-17 DIAGNOSIS — N3001 Acute cystitis with hematuria: Secondary | ICD-10-CM | POA: Insufficient documentation

## 2023-09-17 DIAGNOSIS — Z7982 Long term (current) use of aspirin: Secondary | ICD-10-CM | POA: Insufficient documentation

## 2023-09-17 DIAGNOSIS — R531 Weakness: Secondary | ICD-10-CM | POA: Diagnosis present

## 2023-09-17 DIAGNOSIS — Z8673 Personal history of transient ischemic attack (TIA), and cerebral infarction without residual deficits: Secondary | ICD-10-CM | POA: Diagnosis not present

## 2023-09-17 DIAGNOSIS — Z7901 Long term (current) use of anticoagulants: Secondary | ICD-10-CM | POA: Insufficient documentation

## 2023-09-17 DIAGNOSIS — D72829 Elevated white blood cell count, unspecified: Secondary | ICD-10-CM | POA: Insufficient documentation

## 2023-09-17 DIAGNOSIS — R5381 Other malaise: Secondary | ICD-10-CM | POA: Diagnosis not present

## 2023-09-17 DIAGNOSIS — Z7902 Long term (current) use of antithrombotics/antiplatelets: Secondary | ICD-10-CM | POA: Diagnosis not present

## 2023-09-17 DIAGNOSIS — Z743 Need for continuous supervision: Secondary | ICD-10-CM | POA: Diagnosis not present

## 2023-09-17 DIAGNOSIS — N390 Urinary tract infection, site not specified: Secondary | ICD-10-CM | POA: Diagnosis not present

## 2023-09-17 LAB — URINALYSIS, ROUTINE W REFLEX MICROSCOPIC
Bacteria, UA: NONE SEEN
Bilirubin Urine: NEGATIVE
Glucose, UA: >=500 mg/dL — AB
Ketones, ur: NEGATIVE mg/dL
Nitrite: NEGATIVE
Protein, ur: 100 mg/dL — AB
RBC / HPF: >50 RBC/hpf (ref 0–5)
Specific Gravity, Urine: 1.018 (ref 1.005–1.030)
WBC, UA: >50 WBC/hpf (ref 0–5)
pH: 5 (ref 5.0–8.0)

## 2023-09-17 LAB — COMPREHENSIVE METABOLIC PANEL WITH GFR
ALT: 13 U/L (ref 0–44)
AST: 14 U/L — ABNORMAL LOW (ref 15–41)
Albumin: 3.3 g/dL — ABNORMAL LOW (ref 3.5–5.0)
Alkaline Phosphatase: 55 U/L (ref 38–126)
Anion gap: 9 (ref 5–15)
BUN: 35 mg/dL — ABNORMAL HIGH (ref 8–23)
CO2: 24 mmol/L (ref 22–32)
Calcium: 8.8 mg/dL — ABNORMAL LOW (ref 8.9–10.3)
Chloride: 102 mmol/L (ref 98–111)
Creatinine, Ser: 1.5 mg/dL — ABNORMAL HIGH (ref 0.61–1.24)
GFR, Estimated: 48 mL/min — ABNORMAL LOW (ref >60)
Glucose, Bld: 119 mg/dL — ABNORMAL HIGH (ref 70–99)
Potassium: 3.7 mmol/L (ref 3.5–5.1)
Sodium: 135 mmol/L (ref 135–145)
Total Bilirubin: 0.8 mg/dL (ref 0.0–1.2)
Total Protein: 7 g/dL (ref 6.5–8.1)

## 2023-09-17 LAB — CBC WITH DIFFERENTIAL/PLATELET
Abs Immature Granulocytes: 0.06 10*3/uL (ref 0.00–0.07)
Basophils Absolute: 0.1 10*3/uL (ref 0.0–0.1)
Basophils Relative: 1 %
Eosinophils Absolute: 0.2 10*3/uL (ref 0.0–0.5)
Eosinophils Relative: 1 %
HCT: 42.3 % (ref 39.0–52.0)
Hemoglobin: 13.3 g/dL (ref 13.0–17.0)
Immature Granulocytes: 1 %
Lymphocytes Relative: 15 %
Lymphs Abs: 1.7 10*3/uL (ref 0.7–4.0)
MCH: 28.1 pg (ref 26.0–34.0)
MCHC: 31.4 g/dL (ref 30.0–36.0)
MCV: 89.2 fL (ref 80.0–100.0)
Monocytes Absolute: 1.7 10*3/uL — ABNORMAL HIGH (ref 0.1–1.0)
Monocytes Relative: 15 %
Neutro Abs: 7.9 10*3/uL — ABNORMAL HIGH (ref 1.7–7.7)
Neutrophils Relative %: 67 %
Platelets: 180 10*3/uL (ref 150–400)
RBC: 4.74 MIL/uL (ref 4.22–5.81)
RDW: 15.1 % (ref 11.5–15.5)
WBC: 11.7 10*3/uL — ABNORMAL HIGH (ref 4.0–10.5)
nRBC: 0 % (ref 0.0–0.2)

## 2023-09-17 LAB — LACTIC ACID, PLASMA: Lactic Acid, Venous: 1.4 mmol/L (ref 0.5–1.9)

## 2023-09-17 MED ORDER — SODIUM CHLORIDE 0.9 % IV SOLN
1.0000 g | Freq: Once | INTRAVENOUS | Status: AC
  Administered 2023-09-17: 1 g via INTRAVENOUS
  Filled 2023-09-17: qty 10

## 2023-09-17 MED ORDER — CEPHALEXIN 500 MG PO CAPS: 500.0000 mg | ORAL_CAPSULE | Freq: Three times a day (TID) | ORAL | 0 refills | Status: AC

## 2023-09-17 NOTE — ED Triage Notes (Signed)
 Pt BIB ems for concern of UTI by wife. Pt has chronic foley in place last changed on August 31, 2023. Thick mucous color noted in foley bag. Per EMS wife had catheter bag on pt's side in the chair. Pt states has this problem commonly.

## 2023-09-17 NOTE — Discharge Instructions (Addendum)
 A urinary sample was sent for a culture, you can follow-up with your doctor on Monday for a recheck but until that time please take cephalexin 3 times a day for 7 days.  Return to the ER for severe worsening symptoms including fevers vomiting weakness or any other worsening symptoms.

## 2023-09-17 NOTE — ED Notes (Signed)
 Pt's spouse's contact info corrected in chart

## 2023-09-17 NOTE — ED Notes (Signed)
 Called for convalescent transport @ [phone]

## 2023-09-17 NOTE — ED Provider Notes (Signed)
 [place] EMERGENCY DEPARTMENT AT [facility] Provider Note   CSN: [id number] Arrival date & time: 09/17/23  1207     History  Chief Complaint  Patient presents with   Weakness    Wesley Ingram is a 78 y.o. male.   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  He is on Eliquis, he presents to the hospital today with a complaint of generalized weakness and what appears to be some purulent appearing urine in the urinary catheter.  The patient has no fevers no vomiting but states he is just weaker than usual over the last couple of days.  Home Medications Prior to Admission medications   Medication Sig Start Date End Date Taking? Authorizing Provider  cephALEXin (KEFLEX) 500 MG capsule Take 1 capsule (500 mg total) by mouth 3 (three) times daily for 7 days. 09/17/23 09/24/23 Yes Eber Hong, MD  acetaminophen (TYLENOL) 325 MG tablet Take 2 tablets (650 mg total) by mouth every 6 (six) hours as needed for mild pain (pain score 1-3) (or Fever >/= 101). 05/03/23   Shon Hale, MD  Alogliptin Benzoate 25 MG TABS Take by mouth as needed. Hold Metformin when he takes it.    [provider]  amLODipine (NORVASC) 2.5 MG tablet Take 1 tablet (2.5 mg total) by mouth daily. Patient taking differently: Take 5 mg by mouth daily.  02/15/19   Sharee Holster, NP  apixaban (ELIQUIS) 5 MG TABS tablet Take 1 tablet (5 mg total) by mouth 2 (two) times daily. 02/15/19   Sharee Holster, NP  aspirin 81 MG chewable tablet Chew by mouth. 11/08/14   [provider]  atorvastatin (LIPITOR) 20 MG tablet Take 1 tablet (20 mg total) by mouth every morning. Patient taking differently: Take 10 mg by mouth daily.  02/15/19   Sharee Holster, NP  b complex vitamins tablet Take 1 tablet by mouth 2 (two) times daily.    [provider]  baclofen (LIORESAL) 10  MG tablet Take by mouth. 11/08/14   [provider]  clopidogrel (PLAVIX) 75 MG tablet Take by mouth. 11/08/14   [provider]  empagliflozin (JARDIANCE) 25 MG TABS tablet Take by mouth. 01/13/22   [provider]  finasteride (PROSCAR) 5 MG tablet Take by mouth. 11/08/14   [provider]  glipiZIDE (GLUCOTROL) 5 MG tablet Take 0.5 tablets (2.5 mg total) by mouth 2 (two) times daily before a meal. 02/15/19   Chilton Si, Chong Sicilian, NP  guaiFENesin (MUCINEX) 600 MG 12 hr tablet Take by mouth. 12/24/22   [provider]  losartan (COZAAR) 100 MG tablet Take 1 tablet (100 mg total) by mouth daily. 02/15/19   Sharee Holster, NP  metFORMIN (GLUCOPHAGE) 1000 MG tablet Take 1 tablet (1,000 mg total) by mouth 2 (two) times daily with a meal. 02/15/19   Chilton Si, Chong Sicilian, NP  metoprolol succinate (TOPROL-XL) 25 MG 24 hr tablet Take 0.5 tablets (12.5 mg total) by mouth daily. Patient taking differently: Take 50 mg by mouth daily. Takes 1/2 tablet daily. 02/15/19   Sharee Holster, NP  NON FORMULARY Diet Type: Regular, NAS, Consistent Carbohydrate Patient not taking: Reported on 03/04/2020 01/29/19   [provider]  Polyvinyl Alcohol-Povidone PF 1.4-0.6 % SOLN Place 1 drop into both eyes 2 (two) times daily. For dry eyes  [provider]  saxagliptin HCl (ONGLYZA) 5 MG TABS tablet Take by mouth. 11/08/14   [provider]  senna-docusate (SENOKOT-S) 8.6-50 MG tablet Take 2 tablets by mouth at bedtime. 05/03/23   Shon Hale, MD      Allergies    Patient has no known allergies.    Review of Systems   Review of Systems  Neurological:  Positive for weakness.  All other systems reviewed and are negative.   Physical Exam Updated Vital Signs BP (!) 122/56 (BP Location: Left Arm)   Pulse 68   Temp 98.1 F (36.7 C) (Oral)   Resp 18   Ht 1.778 m (5\' 10" )   Wt 68 kg   SpO2 98%   BMI 21.52 kg/m  Physical Exam Vitals and nursing note  reviewed.  Constitutional:      General: He is not in acute distress.    Appearance: He is well-developed.  HENT:     Head: Normocephalic and atraumatic.     Mouth/Throat:     Pharynx: No oropharyngeal exudate.  Eyes:     General: No scleral icterus.       Right eye: No discharge.        Left eye: No discharge.     Conjunctiva/sclera: Conjunctivae normal.     Pupils: Pupils are equal, round, and reactive to light.  Neck:     Thyroid: No thyromegaly.     Vascular: No JVD.  Cardiovascular:     Rate and Rhythm: Normal rate and regular rhythm.     Heart sounds: Normal heart sounds. No murmur heard.    No friction rub. No gallop.  Pulmonary:     Effort: Pulmonary effort is normal. No respiratory distress.     Breath sounds: Normal breath sounds. No wheezing or rales.  Abdominal:     General: Bowel sounds are normal. There is no distension.     Palpations: Abdomen is soft. There is no mass.     Tenderness: There is no abdominal tenderness.     Comments: Nontender abdomen  Genitourinary:    Comments: Foley catheter present and well-seated in the urethra, no tenderness in the abdomen, purulent appearing urine in the catheter Musculoskeletal:        General: No tenderness. Normal range of motion.     Cervical back: Normal range of motion and neck supple.  Lymphadenopathy:     Cervical: No cervical adenopathy.  Skin:    General: Skin is warm and dry.     Findings: No erythema or rash.  Neurological:     Mental Status: He is alert.     Comments: Answers questions appropriately, left-sided arm and leg hemiparesis and contractures  Psychiatric:        Behavior: Behavior normal.     ED Results / Procedures / Treatments   Labs (all labs ordered are listed, but only abnormal results are displayed) Labs Reviewed  URINALYSIS, ROUTINE W REFLEX MICROSCOPIC - Abnormal; Notable for the following components:      Result Value   Color, Urine AMBER (*)    APPearance TURBID (*)     Glucose, UA >=500 (*)    Hgb urine dipstick SMALL (*)    Protein, ur 100 (*)    Leukocytes,Ua LARGE (*)    Non Squamous Epithelial 0-5 (*)    All other components within normal limits  CBC WITH DIFFERENTIAL/PLATELET - Abnormal; Notable for the following components:   WBC 11.7 (*)    Neutro Abs  7.9 (*)    Monocytes Absolute 1.7 (*)    All other components within normal limits  COMPREHENSIVE METABOLIC PANEL WITH GFR - Abnormal; Notable for the following components:   Glucose, Bld 119 (*)    BUN 35 (*)    Creatinine, Ser 1.50 (*)    Calcium 8.8 (*)    Albumin 3.3 (*)    AST 14 (*)    GFR, Estimated 48 (*)    All other components within normal limits  URINE CULTURE  LACTIC ACID, PLASMA    EKG None  Radiology No results found.  Procedures Procedures    Medications Ordered in ED Medications  cefTRIAXone (ROCEPHIN) 1 g in sodium chloride 0.9 % 100 mL IVPB (has no administration in time range)    ED Course/ Medical Decision Making/ A&P                                 Medical Decision Making Amount and/or Complexity of Data Reviewed Labs: ordered.   The patient has vital signs which are unremarkable, pulse of 68, blood pressure of 122 systolic, afebrile, he has no focal weakness, he is awake and alert and asking for something to eat.  Will need to have Foley catheter changed and urine sample sent.   This patient presents to the ED for concern of weakness and purulent appearing urine differential diagnosis includes urinary tract infection, dehydration, renal failure    Additional history obtained:  Additional history obtained from medical record External records from outside source obtained and reviewed including prior emergency department visits, office visits with his family doctor, had been admitted to the hospital in November 2024 with a urinary tract infection   Lab Tests:  I Ordered, and personally interpreted labs.  The pertinent results include: Urinalysis  with too numerous to count white blood cells and red blood cells, large leukocytes, leukocytosis of 11,700, metabolic panel with a reassuring creatinine of 1.5 which is as good as it has been since November lactic acid of 1.4   Medicines ordered and prescription drug management:  I ordered medication including Rocephin for urinary tract infection Reevaluation of the patient after these medicines showed that the patient improved I have reviewed the patients home medicines and have made adjustments as needed   Problem List / ED Course:  The patient is very stable looking, blood pressure 122/56 with a pulse of 68 and a temperature of 98.1, no findings of sepsis, patient given antibiotics, I attempted to call the patient's spouse from the demographic record with her phone number as well as the home phone number and there was no answer.  Patient is stable for discharge, tried to contact family at this time   The patient is eating food and drinking liquids, no vomiting, tolerating p.o.           Final Clinical Impression(s) / ED Diagnoses Final diagnoses:  Acute cystitis with hematuria    Rx / DC Orders ED Discharge Orders          Ordered    cephALEXin (KEFLEX) 500 MG capsule  3 times daily        09/17/23 1444              Eber Hong, MD 09/17/23 1444

## 2023-09-17 NOTE — ED Notes (Addendum)
 Left message for spouse that pt is ready to be picked up.  Received call back that this is the incorrect number in the chart for this patient's spouse

## 2023-09-17 NOTE — ED Notes (Signed)
 Received correct phone number from patient and wife is on her way to pick him up
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
## 2023-09-19 LAB — URINE CULTURE: Culture: >=100000 — AB

## 2023-09-20 ENCOUNTER — Telehealth (HOSPITAL_BASED_OUTPATIENT_CLINIC_OR_DEPARTMENT_OTHER): Payer: Self-pay

## 2023-09-20 NOTE — Telephone Encounter (Signed)
 Post ED Visit - Positive Culture Follow-up: Chart Hand-off to ED Flow Manager  Culture assessed and recommendations reviewed by: []  Isaac Bliss, Pharm.D., BCPS []  Celedonio Miyamoto, Pharm.D., BCPS-AQ ID []  Georgina Pillion, Pharm.D., BCPS []  [place], [address].D., BCPS, AAHIVP []  Estella Husk, Pharm .D., BCPS, AAHIVP []  Tennis Must, Pharm.D. []  Casilda Carls, Pharm.DPara Skeans, PharmD  Positive urine culture  []  Patient discharged without antimicrobial prescription and treatment is now indicated [x]  Organism is resistant to prescribed ED discharge antimicrobial []  Patient with positive blood cultures  Changes discussed with ED provider: Barnie Alderman PA-C New antibiotic prescription "Bactrim 1 DS tablet by mouth twice daily x 7 day"  "Call patient.  Stop keflex.  Start Bactrim 1 DS tablet by mouth twice daily x 7 day"  09/20/23 @ 1004 LVM requesting callback.  Letter sent to [facility] address.   Arvid Right 09/20/2023, 9:57 AM

## 2023-10-20 ENCOUNTER — Inpatient Hospital Stay (HOSPITAL_COMMUNITY)
Admission: EM | Admit: 2023-10-20 | Discharge: 2023-10-22 | DRG: 698 | Disposition: A | Discharge status: Home Health | Attending: Internal Medicine | Admitting: Internal Medicine

## 2023-10-20 ENCOUNTER — Other Ambulatory Visit: Payer: Self-pay

## 2023-10-20 ENCOUNTER — Encounter (HOSPITAL_COMMUNITY): Payer: Self-pay | Admitting: Emergency Medicine

## 2023-10-20 ENCOUNTER — Inpatient Hospital Stay (HOSPITAL_COMMUNITY)

## 2023-10-20 DIAGNOSIS — G9341 Metabolic encephalopathy: Secondary | ICD-10-CM | POA: Diagnosis not present

## 2023-10-20 DIAGNOSIS — E1122 Type 2 diabetes mellitus with diabetic chronic kidney disease: Secondary | ICD-10-CM | POA: Diagnosis present

## 2023-10-20 DIAGNOSIS — N1832 Chronic kidney disease, stage 3b: Secondary | ICD-10-CM | POA: Diagnosis present

## 2023-10-20 DIAGNOSIS — Y846 Urinary catheterization as the cause of abnormal reaction of the patient, or of later complication, without mention of misadventure at the time of the procedure: Secondary | ICD-10-CM | POA: Diagnosis present

## 2023-10-20 DIAGNOSIS — N134 Hydroureter: Secondary | ICD-10-CM | POA: Diagnosis not present

## 2023-10-20 DIAGNOSIS — R531 Weakness: Secondary | ICD-10-CM | POA: Diagnosis not present

## 2023-10-20 DIAGNOSIS — I69354 Hemiplegia and hemiparesis following cerebral infarction affecting left non-dominant side: Secondary | ICD-10-CM

## 2023-10-20 DIAGNOSIS — Z7984 Long term (current) use of oral hypoglycemic drugs: Secondary | ICD-10-CM

## 2023-10-20 DIAGNOSIS — Z89411 Acquired absence of right great toe: Secondary | ICD-10-CM | POA: Diagnosis not present

## 2023-10-20 DIAGNOSIS — F01518 Vascular dementia, unspecified severity, with other behavioral disturbance: Secondary | ICD-10-CM | POA: Diagnosis present

## 2023-10-20 DIAGNOSIS — E1169 Type 2 diabetes mellitus with other specified complication: Secondary | ICD-10-CM | POA: Diagnosis present

## 2023-10-20 DIAGNOSIS — Z87442 Personal history of urinary calculi: Secondary | ICD-10-CM

## 2023-10-20 DIAGNOSIS — Z86718 Personal history of other venous thrombosis and embolism: Secondary | ICD-10-CM

## 2023-10-20 DIAGNOSIS — Z79899 Other long term (current) drug therapy: Secondary | ICD-10-CM | POA: Diagnosis not present

## 2023-10-20 DIAGNOSIS — L89322 Pressure ulcer of left buttock, stage 2: Secondary | ICD-10-CM | POA: Diagnosis present

## 2023-10-20 DIAGNOSIS — E1151 Type 2 diabetes mellitus with diabetic peripheral angiopathy without gangrene: Secondary | ICD-10-CM | POA: Diagnosis present

## 2023-10-20 DIAGNOSIS — T83511A Infection and inflammatory reaction due to indwelling urethral catheter, initial encounter: Secondary | ICD-10-CM | POA: Diagnosis not present

## 2023-10-20 DIAGNOSIS — D631 Anemia in chronic kidney disease: Secondary | ICD-10-CM | POA: Diagnosis present

## 2023-10-20 DIAGNOSIS — D638 Anemia in other chronic diseases classified elsewhere: Secondary | ICD-10-CM | POA: Diagnosis not present

## 2023-10-20 DIAGNOSIS — I825Y9 Chronic embolism and thrombosis of unspecified deep veins of unspecified proximal lower extremity: Secondary | ICD-10-CM | POA: Diagnosis not present

## 2023-10-20 DIAGNOSIS — N1330 Unspecified hydronephrosis: Secondary | ICD-10-CM | POA: Diagnosis not present

## 2023-10-20 DIAGNOSIS — Z807 Family history of other malignant neoplasms of lymphoid, hematopoietic and related tissues: Secondary | ICD-10-CM

## 2023-10-20 DIAGNOSIS — F02818 Dementia in other diseases classified elsewhere, unspecified severity, with other behavioral disturbance: Secondary | ICD-10-CM | POA: Diagnosis present

## 2023-10-20 DIAGNOSIS — Z66 Do not resuscitate: Secondary | ICD-10-CM | POA: Diagnosis present

## 2023-10-20 DIAGNOSIS — Z7982 Long term (current) use of aspirin: Secondary | ICD-10-CM

## 2023-10-20 DIAGNOSIS — Z87440 Personal history of urinary (tract) infections: Secondary | ICD-10-CM | POA: Diagnosis not present

## 2023-10-20 DIAGNOSIS — E785 Hyperlipidemia, unspecified: Secondary | ICD-10-CM | POA: Diagnosis not present

## 2023-10-20 DIAGNOSIS — I152 Hypertension secondary to endocrine disorders: Secondary | ICD-10-CM | POA: Diagnosis present

## 2023-10-20 DIAGNOSIS — Z7901 Long term (current) use of anticoagulants: Secondary | ICD-10-CM

## 2023-10-20 DIAGNOSIS — I639 Cerebral infarction, unspecified: Secondary | ICD-10-CM | POA: Diagnosis present

## 2023-10-20 DIAGNOSIS — I82509 Chronic embolism and thrombosis of unspecified deep veins of unspecified lower extremity: Secondary | ICD-10-CM | POA: Diagnosis present

## 2023-10-20 DIAGNOSIS — E781 Pure hyperglyceridemia: Secondary | ICD-10-CM | POA: Diagnosis present

## 2023-10-20 DIAGNOSIS — N390 Urinary tract infection, site not specified: Secondary | ICD-10-CM | POA: Diagnosis not present

## 2023-10-20 DIAGNOSIS — R4689 Other symptoms and signs involving appearance and behavior: Secondary | ICD-10-CM

## 2023-10-20 DIAGNOSIS — Z743 Need for continuous supervision: Secondary | ICD-10-CM | POA: Diagnosis not present

## 2023-10-20 DIAGNOSIS — I69391 Dysphagia following cerebral infarction: Secondary | ICD-10-CM

## 2023-10-20 DIAGNOSIS — N183 Chronic kidney disease, stage 3 unspecified: Secondary | ICD-10-CM | POA: Diagnosis present

## 2023-10-20 DIAGNOSIS — R6889 Other general symptoms and signs: Secondary | ICD-10-CM | POA: Diagnosis not present

## 2023-10-20 DIAGNOSIS — R54 Age-related physical debility: Secondary | ICD-10-CM | POA: Diagnosis present

## 2023-10-20 LAB — CBC WITH DIFFERENTIAL/PLATELET
Abs Immature Granulocytes: 0.1 10*3/uL — ABNORMAL HIGH (ref 0.00–0.07)
Basophils Absolute: 0.1 10*3/uL (ref 0.0–0.1)
Basophils Relative: 0 %
Eosinophils Absolute: 0.2 10*3/uL (ref 0.0–0.5)
Eosinophils Relative: 1 %
HCT: 47 % (ref 39.0–52.0)
Hemoglobin: 14.9 g/dL (ref 13.0–17.0)
Immature Granulocytes: 1 %
Lymphocytes Relative: 6 %
Lymphs Abs: 1 10*3/uL (ref 0.7–4.0)
MCH: 28.3 pg (ref 26.0–34.0)
MCHC: 31.7 g/dL (ref 30.0–36.0)
MCV: 89.4 fL (ref 80.0–100.0)
Monocytes Absolute: 1.6 10*3/uL — ABNORMAL HIGH (ref 0.1–1.0)
Monocytes Relative: 9 %
Neutro Abs: 14 10*3/uL — ABNORMAL HIGH (ref 1.7–7.7)
Neutrophils Relative %: 83 %
Platelets: 175 10*3/uL (ref 150–400)
RBC: 5.26 MIL/uL (ref 4.22–5.81)
RDW: 15.2 % (ref 11.5–15.5)
WBC: 16.9 10*3/uL — ABNORMAL HIGH (ref 4.0–10.5)
nRBC: 0 % (ref 0.0–0.2)

## 2023-10-20 LAB — URINALYSIS, ROUTINE W REFLEX MICROSCOPIC
Bacteria, UA: NONE SEEN
Bilirubin Urine: NEGATIVE
Glucose, UA: >=500 mg/dL — AB
Ketones, ur: NEGATIVE mg/dL
Nitrite: NEGATIVE
Protein, ur: 100 mg/dL — AB
RBC / HPF: >50 RBC/hpf (ref 0–5)
Specific Gravity, Urine: 1.017 (ref 1.005–1.030)
WBC, UA: >50 WBC/hpf (ref 0–5)
pH: 6 (ref 5.0–8.0)

## 2023-10-20 LAB — COMPREHENSIVE METABOLIC PANEL WITH GFR
ALT: 17 U/L (ref 0–44)
AST: 18 U/L (ref 15–41)
Albumin: 3.9 g/dL (ref 3.5–5.0)
Alkaline Phosphatase: 76 U/L (ref 38–126)
Anion gap: 9 (ref 5–15)
BUN: 34 mg/dL — ABNORMAL HIGH (ref 8–23)
CO2: 22 mmol/L (ref 22–32)
Calcium: 9.3 mg/dL (ref 8.9–10.3)
Chloride: 103 mmol/L (ref 98–111)
Creatinine, Ser: 1.38 mg/dL — ABNORMAL HIGH (ref 0.61–1.24)
GFR, Estimated: 53 mL/min — ABNORMAL LOW (ref >60)
Glucose, Bld: 144 mg/dL — ABNORMAL HIGH (ref 70–99)
Potassium: 4 mmol/L (ref 3.5–5.1)
Sodium: 134 mmol/L — ABNORMAL LOW (ref 135–145)
Total Bilirubin: 0.5 mg/dL (ref 0.0–1.2)
Total Protein: 7.7 g/dL (ref 6.5–8.1)

## 2023-10-20 LAB — SALICYLATE LEVEL: Salicylate Lvl: <7 mg/dL — ABNORMAL LOW (ref 7.0–30.0)

## 2023-10-20 LAB — ETHANOL: Alcohol, Ethyl (B): <15 mg/dL (ref <15)

## 2023-10-20 LAB — ACETAMINOPHEN LEVEL: Acetaminophen (Tylenol), Serum: <10 ug/mL — ABNORMAL LOW (ref 10–30)

## 2023-10-20 MED ORDER — SENNOSIDES-DOCUSATE SODIUM 8.6-50 MG PO TABS
2.0000 | ORAL_TABLET | Freq: Every day | ORAL | Status: DC
  Administered 2023-10-21: 2 via ORAL
  Filled 2023-10-20: qty 2

## 2023-10-20 MED ORDER — QUETIAPINE FUMARATE 25 MG PO TABS
25.0000 mg | ORAL_TABLET | Freq: Every day | ORAL | Status: DC
  Administered 2023-10-20 – 2023-10-21 (×2): 25 mg via ORAL
  Filled 2023-10-20 (×2): qty 1

## 2023-10-20 MED ORDER — ATORVASTATIN CALCIUM 20 MG PO TABS
20.0000 mg | ORAL_TABLET | Freq: Every morning | ORAL | Status: DC
  Administered 2023-10-21 – 2023-10-22 (×2): 20 mg via ORAL
  Filled 2023-10-20 (×2): qty 1

## 2023-10-20 MED ORDER — LINAGLIPTIN 5 MG PO TABS
5.0000 mg | ORAL_TABLET | Freq: Every day | ORAL | Status: DC
  Administered 2023-10-21 – 2023-10-22 (×2): 5 mg via ORAL
  Filled 2023-10-20 (×2): qty 1

## 2023-10-20 MED ORDER — GUAIFENESIN ER 600 MG PO TB12
600.0000 mg | ORAL_TABLET | Freq: Two times a day (BID) | ORAL | Status: DC
  Administered 2023-10-20 – 2023-10-22 (×4): 600 mg via ORAL
  Filled 2023-10-20 (×4): qty 1

## 2023-10-20 MED ORDER — TAMSULOSIN HCL 0.4 MG PO CAPS
0.4000 mg | ORAL_CAPSULE | Freq: Every day | ORAL | Status: DC
  Administered 2023-10-21 – 2023-10-22 (×2): 0.4 mg via ORAL
  Filled 2023-10-20 (×2): qty 1

## 2023-10-20 MED ORDER — ACETAMINOPHEN 325 MG PO TABS: 650.0000 mg | ORAL_TABLET | Freq: Four times a day (QID) | ORAL | Status: DC | PRN

## 2023-10-20 MED ORDER — APIXABAN 2.5 MG PO TABS
2.5000 mg | ORAL_TABLET | Freq: Two times a day (BID) | ORAL | Status: DC
  Administered 2023-10-20 – 2023-10-22 (×4): 2.5 mg via ORAL
  Filled 2023-10-20 (×4): qty 1

## 2023-10-20 MED ORDER — SODIUM CHLORIDE 0.9 % IV SOLN
1.0000 g | Freq: Two times a day (BID) | INTRAVENOUS | Status: DC
  Administered 2023-10-20 – 2023-10-22 (×4): 1 g via INTRAVENOUS
  Filled 2023-10-20 (×4): qty 20

## 2023-10-20 MED ORDER — SODIUM CHLORIDE 0.9 % IV SOLN
1.0000 g | Freq: Once | INTRAVENOUS | Status: AC
  Administered 2023-10-20: 1 g via INTRAVENOUS

## 2023-10-20 MED ORDER — ASPIRIN 81 MG PO CHEW
81.0000 mg | CHEWABLE_TABLET | Freq: Every day | ORAL | Status: DC
  Administered 2023-10-21 – 2023-10-22 (×2): 81 mg via ORAL
  Filled 2023-10-20 (×2): qty 1

## 2023-10-20 MED ORDER — ALBUTEROL SULFATE (2.5 MG/3ML) 0.083% IN NEBU: 2.5000 mg | INHALATION_SOLUTION | RESPIRATORY_TRACT | Status: DC | PRN

## 2023-10-20 MED ORDER — POLYETHYLENE GLYCOL 3350 17 G PO PACK: 17.0000 g | PACK | Freq: Every day | ORAL | Status: DC | PRN

## 2023-10-20 MED ORDER — METOPROLOL SUCCINATE ER 25 MG PO TB24
12.5000 mg | ORAL_TABLET | Freq: Every day | ORAL | Status: DC
  Administered 2023-10-21 – 2023-10-22 (×2): 12.5 mg via ORAL
  Filled 2023-10-20 (×2): qty 1

## 2023-10-20 MED ORDER — SODIUM CHLORIDE 0.9 % IV SOLN
1.0000 g | Freq: Once | INTRAVENOUS | Status: DC
  Administered 2023-10-20: 1 g via INTRAVENOUS
  Filled 2023-10-20: qty 10

## 2023-10-20 MED ORDER — INSULIN ASPART 100 UNIT/ML IJ SOLN
0.0000 [IU] | Freq: Three times a day (TID) | INTRAMUSCULAR | Status: DC
  Administered 2023-10-21 – 2023-10-22 (×3): 1 [IU] via SUBCUTANEOUS

## 2023-10-20 MED ORDER — ACETAMINOPHEN 650 MG RE SUPP: 650.0000 mg | Freq: Four times a day (QID) | RECTAL | Status: DC | PRN

## 2023-10-20 MED ORDER — HYDRALAZINE HCL 20 MG/ML IJ SOLN: 5.0000 mg | INTRAMUSCULAR | Status: DC | PRN

## 2023-10-20 NOTE — H&P (Signed)
 [facility] H&P   Patient Demographics:    Wesley Ingram, is a 78 y.o. male  MRN: [id number]   DOB - Oct 14, 1945  Admit Date - 10/20/2023  Outpatient Primary MD for the patient is [facility]  Referring MD/NP/PA: PA Rigney  Outpatient Specialists: urology at [facility]( was Dr Secundino Dach couple years ago)    Patient coming from: home  Chief Complaint  Patient presents with   Urinary Tract Infection      HPI:    Wesley Ingram  is a 78 y.o. male, with medical history significant for diabetes mellitus, hypertension, stroke with left-sided hemiparesis, DVT on chronic anticoagulation, chronic Foley catheter, multiple UTIs , with history of kidney stones, used to follow with Dr. Secundino Dach in the past, but following with [facility] urology currently. -Patient presents from home due to increased confusion, combativeness and altered mental status, spouse at bedside, provide the history, reports patient has been on aggressive recently over the last few days, this is usually how he presents with his recurrent UTIs, ports Foley catheter was exchanged 2 weeks ago by his home health, reports he is following with urology [facility], and supposed to have couple studies done (she cannot recall the name). - In ED labs were noted for elevated white blood cell count at 16.9, he was afebrile, creatinine at 1.38, Foley catheter showing urine with high sediment, was exchanged in ED, urinalysis showing pyuria, patient was started on Rocephin  in ED, patient was just here in April in ED where he was discharged on Keflex ,.   Review of systems:     A full 10 point Review of Systems was done, except as stated above, all other Review of Systems were negative.   With Past History of the following :    Past Medical History:  Diagnosis Date   Diabetes mellitus    DVT (deep venous thrombosis) (HCC)    Foot ulcer due to  secondary DM (HCC)    High triglycerides    Hypertension    PVD (peripheral vascular disease) (HCC)    Renal disorder    Stroke [facility]) 2008   left hemiparesis      Past Surgical History:  Procedure Laterality Date   AMPUTATION Right 01/25/2019   Procedure: AMPUTATION RIGHT GREAT TOE;  Surgeon: Margherita Shell, MD;  Location: [facility] OR;  Service: Vascular;  Laterality: Right;   COLONOSCOPY  2009   [facility] in [place]: diverticulosis, next TCS in 07/2017   CYSTOSCOPY WITH RETROGRADE PYELOGRAM, URETEROSCOPY AND STENT PLACEMENT Right 11/19/2016   Procedure: CYSTOSCOPY WITH RETROGRADE PYELOGRAM, URETEROSCOPY AND STENT EXCHANGE;  Surgeon: Osborn Blaze, MD;  Location: [facility] ORS;  Service: Urology;  Laterality: Right;   CYSTOSCOPY WITH STENT PLACEMENT Right 10/19/2016   Procedure: CYSTOSCOPY WITH STENT PLACEMENT;  Surgeon: Osborn Blaze, MD;  Location: [facility] ORS;  Service: Urology;  Laterality: Right;   ENDARTERECTOMY  Left popliteal artery endarterectomy with left above-knee popliteal artery to tibial peroneal trunk bypass with reverse saphenous vein   HERNIA REPAIR     bilateral inguinal   HOLMIUM LASER APPLICATION Right 11/19/2016   Procedure: HOLMIUM LASER APPLICATION;  Surgeon: Osborn Blaze, MD;  Location: [facility] ORS;  Service: Urology;  Laterality: Right;   TOE AMPUTATION     TONSILLECTOMY        Social History:     Social History   Tobacco Use   Smoking status: Never   Smokeless tobacco: Never  Substance Use Topics   Alcohol  use: No        Family History :     Family History  Problem Relation Age of Onset   Multiple myeloma Mother 41   Colon cancer Neg Hx      Home Medications:   Prior to Admission medications   Medication Sig Start Date End Date Taking? Authorizing Provider  apixaban  (ELIQUIS ) 5 MG TABS tablet Take 1 tablet (5 mg total) by mouth 2 (two) times daily. Patient taking differently: Take 2.5 mg by mouth 2 (two) times daily. 02/15/19  Yes Marilyne Shu, NP  aspirin  81 MG chewable tablet Chew 81 mg by mouth daily. 11/08/14  Yes [provider]  atorvastatin  (LIPITOR) 20 MG tablet Take 1 tablet (20 mg total) by mouth every morning. Patient taking differently: Take 10 mg by mouth daily. 02/15/19  Yes Marilyne Shu, NP  empagliflozin (JARDIANCE) 25 MG TABS tablet Take 25 mg by mouth daily. 01/13/22  Yes [provider]  guaiFENesin (MUCINEX) 600 MG 12 hr tablet Take 600 mg by mouth 2 (two) times daily. 12/24/22  Yes [provider]  metoprolol  succinate (TOPROL -XL) 25 MG 24 hr tablet Take 0.5 tablets (12.5 mg total) by mouth daily. Patient taking differently: Take 25 mg by mouth daily. 02/15/19  Yes Marilyne Shu, NP  polyethylene glycol (MIRALAX  / GLYCOLAX ) 17 g packet Take 17 g by mouth daily as needed for mild constipation.   Yes [provider]  senna-docusate (SENOKOT-S) 8.6-50 MG tablet Take 2 tablets by mouth at bedtime. 05/03/23  Yes Emokpae, Courage, MD  sitaGLIPtin (JANUVIA) 25 MG tablet Take 25 mg by mouth daily.   Yes [provider]  tamsulosin (FLOMAX) 0.4 MG CAPS capsule Take 0.4 mg by mouth daily after supper.   Yes [provider]     Allergies:    No Known Allergies   Physical Exam:   Vitals  Blood pressure 127/73, pulse 90, temperature 98.2 F (36.8 C), temperature source Oral, resp. rate 20, height 5\' 10"  (1.778 m), weight 68 kg, SpO2 98%.   1. General Frail, ill-appearing male, laying in bed in no apparent distress  2.  Awake, alert, slurred speech at baseline, oriented x 2 (he knows it is 2025 but cannot give exact date or day)  3.  Slurred speech, dense left sided hemiparesis  4. Ears and Eyes appear Normal, Conjunctivae clear, PERRLA. Moist Oral Mucosa.  5. Supple Neck, No JVD, No cervical lymphadenopathy appriciated, No Carotid Bruits.  6. Symmetrical Chest wall movement, Good air movement bilaterally, CTAB.  7. RRR, No Gallops, Rubs or Murmurs,  No Parasternal Heave.  8. Positive Bowel Sounds, Abdomen Soft, No tenderness, No organomegaly appriciated,No rebound -guarding or rigidity.  9.  No Cyanosis, Normal Skin Turgor, No Skin Rash or Bruise.     Data Review:    CBC Recent Labs  Lab 10/20/23 1505  WBC 16.9*  HGB 14.9  HCT 47.0  PLT 175  MCV 89.4  MCH 28.3  MCHC 31.7  RDW 15.2  LYMPHSABS 1.0  MONOABS 1.6*  EOSABS 0.2  BASOSABS 0.1   ------------------------------------------------------------------------------------------------------------------  Chemistries  Recent Labs  Lab 10/20/23 1505  NA 134*  K 4.0  CL 103  CO2 22  GLUCOSE 144*  BUN 34*  CREATININE 1.38*  CALCIUM  9.3  AST 18  ALT 17  ALKPHOS 76  BILITOT 0.5   ------------------------------------------------------------------------------------------------------------------ estimated creatinine clearance is 43.8 mL/min (A) (by C-G formula based on SCr of 1.38 mg/dL (H)). ------------------------------------------------------------------------------------------------------------------ No results for input(s): "TSH", "T4TOTAL", "T3FREE", "THYROIDAB" in the last 72 hours.  Invalid input(s): "FREET3"  Coagulation profile No results for input(s): "INR", "PROTIME" in the last 168 hours. ------------------------------------------------------------------------------------------------------------------- No results for input(s): "DDIMER" in the last 72 hours. -------------------------------------------------------------------------------------------------------------------  Cardiac Enzymes No results for input(s): "CKMB", "TROPONINI", "MYOGLOBIN" in the last 168 hours.  Invalid input(s): "CK" ------------------------------------------------------------------------------------------------------------------ No results found for:  "BNP"   ---------------------------------------------------------------------------------------------------------------  Urinalysis    Component Value Date/Time   COLORURINE AMBER (A) 10/20/2023 1453   APPEARANCEUR TURBID (A) 10/20/2023 1453   LABSPEC 1.017 10/20/2023 1453   PHURINE 6.0 10/20/2023 1453   GLUCOSEU >=500 (A) 10/20/2023 1453   HGBUR MODERATE (A) 10/20/2023 1453   BILIRUBINUR NEGATIVE 10/20/2023 1453   KETONESUR NEGATIVE 10/20/2023 1453   PROTEINUR 100 (A) 10/20/2023 1453   NITRITE NEGATIVE 10/20/2023 1453   LEUKOCYTESUR LARGE (A) 10/20/2023 1453    ----------------------------------------------------------------------------------------------------------------   Imaging Results:    No results found.  EKG:  Vent. rate 92 BPM PR interval 178 ms QRS duration 146 ms QT/QTcB 356/441 ms P-R-T axes 49 -56 45 Sinus rhythm RBBB and LAFB    Assessment & Plan:    Principal Problem:   UTI (urinary tract infection) Active Problems:   CVA (cerebral vascular accident) (HCC)   Hyperlipidemia   Chronic anticoagulation   Dyslipidemia associated with type 2 diabetes mellitus (HCC)   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)   Chronic deep vein thrombosis (DVT) (HCC)   Complicated UTI (urinary tract infection)   CKD (chronic kidney disease) stage 3, GFR 30-59 ml/min (HCC)    Complicated CAUTI, present on admission - Chronic indwelling Foley catheter, exchanged in ED, prior to that was exchanged 2 weeks ago at home -Follows with urology at the [place]. wife is supposed to have couple of studies he was not able to keep his appointments due to his agitations. - Deceived Rocephin  in ED, but given his previous cultures in the past with multiple resistance I will start on imipenem. - Recurrent UTIs with multiple different species, unclear what is his diagnosis under urology, I will obtain CT renal protocol to evaluate for reason for his recurrent UTIs. - Avoid  Jardiance, keep holding on discharge    CKD stage IIIb-- -at baseline, avoid nephrotoxic medications   Chronic DVT  Chronic anticoagulation  - Continue with Eliquis     History of CVA (cerebrovascular accident) Dysphagia - CVA with chronic left-sided deficits. -Continue with aspirin  and statin -Per wife patient is on liquid at baseline, will consult SLP but for now we will keep on dysphagia 2 with nectar thick -Will consult PT, OT   Hypertension associated with type 2 diabetes mellitus (HCC) Continue with home medications   Diabetes mellitus, type II  - He is on Jardiance and Januvia, certainly will hold Jardiance, discontinue on discharge given recurrent UTIs . - Continue with Januvia, will monitor CBG,  check A1c, if elevated will keep on insulin  sliding scale .  Vascular dementia with behavioral disturbances Acute encephalopathy  - Does appear patient with some baseline dementia, likely vascular dementia from his strokes, but with worsening mentation currently, she does report gradual decline in mentation and functional status, will start on Seroquel, likely will need to continue on discharge and uptitrate gradually as he appears to be having dementia with behavioral disturbances   Social/Ethics -Was confirmed patient DNR/DNI, this is per his wishes and family wishes at bedside    DVT Prophylaxis Eliquis   AM Labs Ordered, also please review Full Orders  Family Communication: Admission, patients condition and plan of care including tests being ordered have been discussed with the patient, wife and daughter  who indicate understanding and agree with the plan and Code Status.  Code Status DNR/DNI  Likely DC to  home  Consults called: none    Admission status: inpatient    Time spent in minutes : 70 minutes   Seena Dadds M.D on 10/20/2023 at 8:41 PM   [facility] - Office  [phone]
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
## 2023-10-21 DIAGNOSIS — D638 Anemia in other chronic diseases classified elsewhere: Secondary | ICD-10-CM

## 2023-10-21 DIAGNOSIS — N390 Urinary tract infection, site not specified: Secondary | ICD-10-CM | POA: Diagnosis not present

## 2023-10-21 DIAGNOSIS — E1169 Type 2 diabetes mellitus with other specified complication: Secondary | ICD-10-CM | POA: Diagnosis not present

## 2023-10-21 DIAGNOSIS — N1832 Chronic kidney disease, stage 3b: Secondary | ICD-10-CM

## 2023-10-21 DIAGNOSIS — G9341 Metabolic encephalopathy: Secondary | ICD-10-CM

## 2023-10-21 DIAGNOSIS — I825Y9 Chronic embolism and thrombosis of unspecified deep veins of unspecified proximal lower extremity: Secondary | ICD-10-CM

## 2023-10-21 DIAGNOSIS — Z7901 Long term (current) use of anticoagulants: Secondary | ICD-10-CM

## 2023-10-21 DIAGNOSIS — T83511A Infection and inflammatory reaction due to indwelling urethral catheter, initial encounter: Secondary | ICD-10-CM | POA: Diagnosis not present

## 2023-10-21 LAB — CBC
HCT: 41.9 % (ref 39.0–52.0)
Hemoglobin: 13.7 g/dL (ref 13.0–17.0)
MCH: 29.4 pg (ref 26.0–34.0)
MCHC: 32.7 g/dL (ref 30.0–36.0)
MCV: 89.9 fL (ref 80.0–100.0)
Platelets: 184 10*3/uL (ref 150–400)
RBC: 4.66 MIL/uL (ref 4.22–5.81)
RDW: 15 % (ref 11.5–15.5)
WBC: 12.3 10*3/uL — ABNORMAL HIGH (ref 4.0–10.5)
nRBC: 0 % (ref 0.0–0.2)

## 2023-10-21 LAB — BASIC METABOLIC PANEL WITH GFR
Anion gap: 8 (ref 5–15)
BUN: 38 mg/dL — ABNORMAL HIGH (ref 8–23)
CO2: 22 mmol/L (ref 22–32)
Calcium: 8.7 mg/dL — ABNORMAL LOW (ref 8.9–10.3)
Chloride: 107 mmol/L (ref 98–111)
Creatinine, Ser: 1.48 mg/dL — ABNORMAL HIGH (ref 0.61–1.24)
GFR, Estimated: 49 mL/min — ABNORMAL LOW (ref >60)
Glucose, Bld: 131 mg/dL — ABNORMAL HIGH (ref 70–99)
Potassium: 4.3 mmol/L (ref 3.5–5.1)
Sodium: 137 mmol/L (ref 135–145)

## 2023-10-21 LAB — RAPID URINE DRUG SCREEN, HOSP PERFORMED
Amphetamines: NOT DETECTED
Barbiturates: NOT DETECTED
Benzodiazepines: NOT DETECTED
Cocaine: NOT DETECTED
Opiates: NOT DETECTED
Tetrahydrocannabinol: NOT DETECTED

## 2023-10-21 LAB — GLUCOSE, CAPILLARY
Glucose-Capillary: 132 mg/dL — ABNORMAL HIGH (ref 70–99)
Glucose-Capillary: 171 mg/dL — ABNORMAL HIGH (ref 70–99)
Glucose-Capillary: 161 mg/dL — ABNORMAL HIGH (ref 70–99)
Glucose-Capillary: 166 mg/dL — ABNORMAL HIGH (ref 70–99)

## 2023-10-21 LAB — HEMOGLOBIN A1C
Hgb A1c MFr Bld: 6.2 % — ABNORMAL HIGH (ref 4.8–5.6)
Mean Plasma Glucose: 131.24 mg/dL

## 2023-10-21 NOTE — Plan of Care (Signed)

## 2023-10-21 NOTE — Plan of Care (Signed)
   Problem: Education: Goal: Knowledge of General Education information will improve Description Including pain rating scale, medication(s)/side effects and non-pharmacologic comfort measures Outcome: Progressing   Problem: Health Behavior/Discharge Planning: Goal: Ability to manage health-related needs will improve Outcome: Progressing

## 2023-10-21 NOTE — TOC Initial Note (Signed)
 Transition of Care [facility]) - Initial/Assessment Note    Patient Details  Name: Wesley Ingram MRN: [id number] Date of Birth: 14-Oct-1945  Transition of Care [facility]) CM/SW Contact:    Grandville Lax, LCSWA Phone Number: 10/21/2023, 1:18 PM  Clinical Narrative:                 Pt is high risk for readmission. CSW spoke with pts wife to complete assessment. Pt assists pt with all ADLs. Pt has all needed DME in the home. Pts wife states they are agreeable to [facility] if recommended. [facility] notified [facility] admission. [facility] notification ID is [id number]. TOC to follow.   Expected Discharge Plan: Home w Home Health Services Barriers to Discharge: Continued Medical Work up   Patient Goals and CMS Choice Patient states their goals for this hospitalization and ongoing recovery are:: return home CMS Medicare.gov Compare Post Acute Care list provided to:: Patient Represenative (must comment) Choice offered to / list presented to : Spouse      Expected Discharge Plan and Services In-house Referral: Clinical Social Work Discharge Planning Services: CM Consult Post Acute Care Choice: Home Health Living arrangements for the past 2 months: Single Family Home                                      Prior Living Arrangements/Services Living arrangements for the past 2 months: Single Family Home Lives with:: Spouse Patient language and need for interpreter reviewed:: Yes Do you feel safe going back to the place where you live?: Yes      Need for Family Participation in Patient Care: Yes (Comment) Care giver support system in place?: Yes (comment) Current home services: DME Criminal Activity/Legal Involvement Pertinent to Current Situation/Hospitalization: No - Comment as needed  Activities of Daily Living   ADL Screening (condition at time of admission) Independently performs ADLs?: No Does the patient have a NEW difficulty with bathing/dressing/toileting/self-feeding that is expected  to last >3 days?: Yes (Initiates electronic notice to provider for possible OT consult) Does the patient have a NEW difficulty with getting in/out of bed, walking, or climbing stairs that is expected to last >3 days?: Yes (Initiates electronic notice to provider for possible PT consult) Does the patient have a NEW difficulty with communication that is expected to last >3 days?: No Is the patient deaf or have difficulty hearing?: No Does the patient have difficulty seeing, even when wearing glasses/contacts?: No Does the patient have difficulty concentrating, remembering, or making decisions?: No  Permission Sought/Granted                  Emotional Assessment Appearance:: Appears stated age       Alcohol  / Substance Use: Not Applicable Psych Involvement: No (comment)  Admission diagnosis:  Metabolic encephalopathy [G93.41] UTI (urinary tract infection) [N39.0] Aggression [R46.89] Urinary tract infection with hematuria, site unspecified [N39.0, R31.9] Patient Active Problem List   Diagnosis Date Noted   UTI (urinary tract infection) 10/20/2023   Complicated UTI (urinary tract infection) 05/01/2023   CKD (chronic kidney disease) stage 3, GFR 30-59 ml/min (HCC) 05/01/2023   Chronic deep vein thrombosis (DVT) (HCC) 02/03/2019   Dyslipidemia associated with type 2 diabetes mellitus (HCC) 02/01/2019   Type 2 diabetes mellitus with neurological complications (HCC) 02/01/2019   Osteomyelitis of great toe of right foot (HCC) 02/01/2019   Amputation of right great toe (HCC) 02/01/2019  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) 02/01/2019   Diabetic foot ulcer (HCC) 01/17/2019   Anemia 01/17/2019   Diarrhea 01/16/2019   Ileus (HCC) 10/19/2016   History of CVA (cerebrovascular accident) 10/19/2016   Nephrolithiasis 10/19/2016   Lytic bone lesions on xray 10/19/2016   Chronic anticoagulation 10/19/2016   TIA (transient ischemic attack) 02/25/2011   CVA (cerebral  vascular accident) (HCC) 02/23/2011   Type 2 diabetes mellitus (HCC) 02/23/2011   Hypertension associated with type 2 diabetes mellitus (HCC) 02/23/2011   Hyperlipidemia 02/23/2011   [facility]:   [facility] [address] [place] - [address] [place] [address] Phone: [phone] Fax: [phone]  [facility] - [place], [place] - [address] [facility] [address] [place] [address] Phone: [phone] Fax: [phone]     Social Drivers of Health (SDOH) Social History: SDOH Screenings   Food Insecurity: No Food Insecurity (10/20/2023)  Housing: Low Risk  (10/20/2023)  Transportation Needs: No Transportation Needs (10/20/2023)  Utilities: Not At Risk (10/20/2023)  Social Connections: Socially Isolated (10/20/2023)  Stress: No Stress Concern Present (11/14/2022)   Received from [facility]  Tobacco Use: Low Risk  (10/20/2023)   SDOH Interventions:     Readmission Risk Interventions    10/21/2023    1:17 PM 10/21/2023   11:10 AM 05/02/2023    2:15 PM  Readmission Risk Prevention Plan  Post Dischage Appt   Not Complete  Medication Screening   Complete  Transportation Screening Complete Complete Complete  HRI or Home Care Consult Complete Complete   Social Work Consult for Recovery Care Planning/Counseling Complete Complete   Palliative Care Screening Not Applicable Not Applicable   Medication Review Oceanographer) Complete Complete

## 2023-10-21 NOTE — Progress Notes (Signed)
 OT Cancellation Note  Patient Details Name: Wesley Ingram MRN: [id number] DOB: 1945-08-06   Cancelled Treatment:    Reason Eval/Treat Not Completed: OT screened, no needs identified, will sign off. Physical therapy reported that the pt is total care at home. Not appropriate for OT evaluation given total care at baseline. Pt will be removed from the OT list.   Thurnell Floss OT, MOT   Thurnell Floss 10/21/2023, 10:48 AM

## 2023-10-21 NOTE — Progress Notes (Signed)
 Progress Note   Patient: Wesley Ingram XBM:[id number] DOB: [birth year] DOA: 10/20/2023     1 DOS: the patient was seen and examined on 10/21/2023   Brief hospital admission narrative : As per H&P written by Dr. Osborne Blazer on [name]  is a 78 y.o. male, with medical history significant for diabetes mellitus, hypertension, stroke with left-sided hemiparesis, DVT on chronic anticoagulation, chronic Foley catheter, multiple UTIs , with history of kidney stones, used to follow with Dr. Secundino Dach in the past, but [facility] -Patient presents from home due to increased confusion, combativeness and altered mental status, spouse at bedside, provide the history, reports patient has been on aggressive recently over the last few days, this is usually how he presents with his recurrent UTIs, ports Foley catheter was exchanged 2 weeks ago by his home health, reports he is following with urology [facility], and supposed to have couple studies done (she cannot recall the name). - In ED labs were noted for elevated white blood cell count at 16.9, he was afebrile, creatinine at 1.38, Foley catheter showing urine with high sediment, was exchanged in ED, urinalysis showing pyuria, patient was started on Rocephin  in ED, patient was just here in April in ED where he was discharged on Keflex ,.  Assessment and plan 1-complicated CAUTI, present on admission with component of metabolic encephalopathy - Continue to maintain adequate hydration - Continue IV antibiotics and follow culture results - Continue avoiding Jardiance at discharge. - Provide as needed antipyretics, constant reorientation and follow clinical response.  2-chronic kidney disease stage IIIb - Continue to minimize the use of nephrotoxic agent - Maintain adequate hydration - Follow renal function trend.  3-chronic DVT - Continue Eliquis  - No signs of overt bleeding appreciated. - Follow hemoglobin  trend.  4-type 2 diabetes with nephropathy - Planning to discontinue the use of Jardiance at discharge in the setting of recurring UTIs. - A1c 6.2 - Follow CBG fluctuation and adjust hypoglycemic regimen as required.  5-hypertension - Continue current antihypertensive agents.  6-vascular dementia with behavioral disturbances - Continue concentrating station and supportive care - Patient at high risk for sundowning - Low-dose Seroquel  has been started  7-social work/ethics - Patient is DNR-DNI and wishes will be respected - Continue to treat was treatable.  8-stage II pressure injury - Present at time of admission - Localizing patient left buttocks - Continue constant repositioning and local care.  No signs of superimposed infection currently appreciated.   Subjective:  Afebrile, following commands appropriately today's examination and reporting no dysuria.  Patient is present no nausea vomiting.  Physical Exam: Vitals:   10/20/23 2320 10/21/23 0417 10/21/23 0737 10/21/23 1354  BP: 135/77 137/75 104/67 (!) 117/51  Pulse: 95 80 79 66  Resp: 18 18 16 18   Temp: 98.1 F (36.7 C) 98.1 F (36.7 C) 98.4 F (36.9 C) 98.9 F (37.2 C)  TempSrc: Oral Oral Oral Oral  SpO2: 97% 93% 98% 100%  Weight:      Height:       General exam: Alert, awake, following commands appropriately and in no acute distress. Respiratory system: Good saturation on room air; no using accessory muscle. Cardiovascular system: Rate controlled, no rubs, no gallops, no JVD. Gastrointestinal system: Abdomen is nondistended, soft and nontender. No organomegaly or masses felt. Normal bowel sounds heard. Central nervous system: Generally weak and frail; no focal neurological deficits. Extremities: No cyanosis or clubbing. Skin: No petechiae.  Stage II left buttock pressure injury present  at time of admission without signs of superimposed infection. Psychiatry: Judgement and insight appear normal. Mood & affect  appropriate.    Data Reviewed: CBC: WBCs 12.3, hemoglobin 13.7 and platelet count 184K Basic metabolic panel: Sodium 137, potassium 4.3, chloride 107, bicarb 22, BUN 38, creatinine 1.4 and GFR 49 UDS: Negative.   Family Communication: No family at bedside.  Disposition: Status is: Inpatient Remains inpatient appropriate because: Continue IV therapy.  Anticipating discharge home once medically stable.  Follow culture results.  Time spent: 50 minutes  Author: Justina Oman, MD 10/21/2023 6:08 PM  For on call review [email or web address].

## 2023-10-22 DIAGNOSIS — N1832 Chronic kidney disease, stage 3b: Secondary | ICD-10-CM | POA: Diagnosis not present

## 2023-10-22 DIAGNOSIS — G9341 Metabolic encephalopathy: Secondary | ICD-10-CM | POA: Diagnosis not present

## 2023-10-22 DIAGNOSIS — T83511A Infection and inflammatory reaction due to indwelling urethral catheter, initial encounter: Secondary | ICD-10-CM | POA: Diagnosis not present

## 2023-10-22 DIAGNOSIS — E785 Hyperlipidemia, unspecified: Secondary | ICD-10-CM

## 2023-10-22 LAB — URINE CULTURE: Culture: >=100000 — AB

## 2023-10-22 LAB — GLUCOSE, CAPILLARY
Glucose-Capillary: 130 mg/dL — ABNORMAL HIGH (ref 70–99)
Glucose-Capillary: 179 mg/dL — ABNORMAL HIGH (ref 70–99)
Glucose-Capillary: 108 mg/dL — ABNORMAL HIGH (ref 70–99)

## 2023-10-22 MED ORDER — QUETIAPINE FUMARATE 25 MG PO TABS: 12.5000 mg | ORAL_TABLET | Freq: Every day | ORAL | 0 refills | Status: AC

## 2023-10-22 MED ORDER — CHLORHEXIDINE GLUCONATE CLOTH 2 % EX PADS
6.0000 | MEDICATED_PAD | Freq: Every day | CUTANEOUS | Status: DC
  Administered 2023-10-22: 6 via TOPICAL

## 2023-10-22 MED ORDER — SULFAMETHOXAZOLE-TRIMETHOPRIM 800-160 MG PO TABS: 1.0000 | ORAL_TABLET | Freq: Every day | ORAL | 0 refills | Status: AC

## 2023-10-22 NOTE — Discharge Summary (Signed)
 Physician Discharge Summary   Patient: Wesley Ingram MRN: [id number] DOB: [birth year]  Admit date:     10/20/2023  Discharge date: 10/22/23  Discharge Physician: Justina Oman   [facility]   Recommendations at discharge:  Repeat basic metabolic panel to follow electrolytes renal function Repeat CBC to follow hemoglobin trend/stability. Make sure patient follow-up with urology service for voiding trial/Foley catheter removal and if needed cystoscopy and urodynamic studies.. Continue close monitoring of patient's CBGs with further adjustment to hypoglycemic regimen as needed (Jardiance has been discontinued continue to increase risk for recurring UTIs).  Discharge Diagnoses: Principal Problem:   UTI (urinary tract infection) Active Problems:   CVA (cerebral vascular accident) (HCC)   Hyperlipidemia   Chronic anticoagulation   Dyslipidemia associated with type 2 diabetes mellitus (HCC)   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)   Chronic deep vein thrombosis (DVT) (HCC)   Complicated UTI (urinary tract infection)   CKD (chronic kidney disease) stage 3, GFR 30-59 ml/min (HCC)   Metabolic encephalopathy  Brief hospital admission narrative : As per H&P written by Dr. Osborne Blazer on [address] Wesley Ingram  is a 78 y.o. male, with medical history significant for diabetes mellitus, hypertension, stroke with left-sided hemiparesis, DVT on chronic anticoagulation, chronic Foley catheter, multiple UTIs , with history of kidney stones, used to follow with Dr. Secundino Dach in the past, but following with [facility] urology currently. -Patient presents from home due to increased confusion, combativeness and altered mental status, spouse at bedside, provide the history, reports patient has been on aggressive recently over the last few days, this is usually how he presents with his recurrent UTIs, ports Foley catheter was exchanged 2 weeks ago by his home health, reports he is following with  urology [facility], and supposed to have couple studies done (she cannot recall the name). - In ED labs were noted for elevated white blood cell count at 16.9, he was afebrile, creatinine at 1.38, Foley catheter showing urine with high sediment, was exchanged in ED, urinalysis showing pyuria, patient was started on Rocephin  in ED, patient was just here in April in ED where he was discharged on Keflex ,.    Assessment and Plan: 1-complicated CAUTI, present on admission with component of metabolic encephalopathy - Continue to maintain adequate hydration -Patient completed 2 days of IV meropenem  and while hospitalized - Following culture results patient has been discharged on 5 more days of oral antibiotics using Bactrim  daily. - Continue avoiding Jardiance at discharge. - No fever, mentation back to baseline and patient hemodynamically stable for discharge. -Microorganism isolated enterobacter cloacae.   2-chronic kidney disease stage IIIb - Continue to minimize the use of nephrotoxic agent - Maintain adequate hydration - Pete basic metabolic panel at follow-up visit to assess electrolytes trend of renal function and stability.   3-chronic DVT - Continue Eliquis  - No signs of overt bleeding appreciated. - Continue to follow hemoglobin trend.   4-type 2 diabetes with nephropathy - Planning to discontinue the use of Jardiance at discharge in the setting of recurring UTIs. - A1c 6.2 - Continue to follow CBG fluctuation and adjust hypoglycemic regimen as required.   5-hypertension - Continue current antihypertensive agents. - Heart healthy diet discussed with patient   6-vascular dementia with behavioral disturbances - Continue concentrating station and supportive care - Patient at high risk for sundowning - Low-dose Seroquel  has been started -Speech therapy has evaluated patient with recommendation for dysphagia 2 diet with thin liquids.   7-social  work/ethics - Patient is  DNR-DNI and wishes will be respected - Continue to treat was treatable.   8-stage II pressure injury - Present at time of admission - Localizing patient left buttocks - Continue constant repositioning and local care.  No signs of superimposed infection currently appreciated.    Consultants: ID pharmacist curbside. Procedures performed: See below for x-ray reports. Disposition: Home Diet recommendation: Heart healthy/modified carbohydrate diet; dysphagia 2 with thin liquids.  DISCHARGE MEDICATION: Allergies as of 10/22/2023   No Known Allergies      Medication List     STOP taking these medications    empagliflozin 25 MG Tabs tablet Commonly known as: JARDIANCE       TAKE these medications    apixaban  5 MG Tabs tablet Commonly known as: ELIQUIS  Take 1 tablet (5 mg total) by mouth 2 (two) times daily. What changed: how much to take   aspirin  81 MG chewable tablet Chew 81 mg by mouth daily.   atorvastatin  20 MG tablet Commonly known as: LIPITOR Take 1 tablet (20 mg total) by mouth every morning. What changed:  how much to take when to take this   guaiFENesin  600 MG 12 hr tablet Commonly known as: MUCINEX  Take 600 mg by mouth 2 (two) times daily.   metoprolol  succinate 25 MG 24 hr tablet Commonly known as: TOPROL -XL Take 0.5 tablets (12.5 mg total) by mouth daily. What changed: how much to take   polyethylene glycol 17 g packet Commonly known as: MIRALAX  / GLYCOLAX  Take 17 g by mouth daily as needed for mild constipation.   QUEtiapine  25 MG tablet Commonly known as: SEROQUEL  Take 0.5 tablets (12.5 mg total) by mouth at bedtime.   senna-docusate 8.6-50 MG tablet Commonly known as: Senokot-S Take 2 tablets by mouth at bedtime.   sitaGLIPtin 25 MG tablet Commonly known as: JANUVIA Take 25 mg by mouth daily.   sulfamethoxazole -trimethoprim  800-160 MG tablet Commonly known as: BACTRIM  DS Take 1 tablet by mouth daily for 5 days.   tamsulosin  0.4 MG  Caps capsule Commonly known as: FLOMAX  Take 0.4 mg by mouth daily after supper.        Follow-up Information     [facility]. Schedule an appointment as soon as possible for a visit in 10 day(s).   Specialty: General Practice Contact information: [address] [place] [address] [phone]                Discharge Exam: Cleavon Curls Weights   10/20/23 1438  Weight: 68 kg   General exam: Alert, awake, following commands appropriately and in no acute distress. Respiratory system: Good saturation on room air; no using accessory muscle. Cardiovascular system: Rate controlled, no rubs, no gallops, no JVD. Gastrointestinal system: Abdomen is nondistended, soft and nontender. No organomegaly or masses felt. Normal bowel sounds heard. Central nervous system: Generally weak and frail; no focal neurological deficits. Extremities: No cyanosis or clubbing. Skin: No petechiae.  Stage II left buttock pressure injury present at time of admission without signs of superimposed infection. Psychiatry: Judgement and insight appear normal. Mood & affect appropriate.   Condition at discharge: Stable and improved.  The results of significant diagnostics from this hospitalization (including imaging, microbiology, ancillary and laboratory) are listed below for reference.   Imaging Studies: CT RENAL STONE STUDY Result Date: 10/20/2023 CLINICAL DATA:  History of recurrent UTIs EXAM: CT ABDOMEN AND PELVIS WITHOUT CONTRAST TECHNIQUE: Multidetector CT imaging of the abdomen and pelvis was performed following the standard protocol  without IV contrast. RADIATION DOSE REDUCTION: This exam was performed according to the departmental dose-optimization program which includes automated exposure control, adjustment of the mA and/or kV according to patient size and/or use of iterative reconstruction technique. COMPARISON:  10/19/16 FINDINGS: Lower chest: Mild scarring is noted in the bases bilaterally.  Hepatobiliary: No focal liver abnormality is seen. No gallstones, gallbladder wall thickening, or biliary dilatation. Pancreas: Unremarkable. No pancreatic ductal dilatation or surrounding inflammatory changes. Spleen: Normal in size without focal abnormality. 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 Left adrenal gland is within normal limits. Left kidney demonstrates no renal calculi or obstructive changes. The right kidney demonstrates significant hydronephrosis and hydroureter. This extends inferiorly to the level of the ureterovesical junction although no discrete calculus is noted. These changes may be related to edema from recently passed stone, poorly calcified stone or possible underlying lesion at the right UVJ. Bladder is decompressed by Foley catheter. Stomach/Bowel: Fecal material is noted scattered throughout the colon. No obstructive changes are noted. The appendix is within normal limits. No small bowel abnormality is noted. Stomach is within normal limits. Vascular/Lymphatic: Aortic atherosclerosis. No enlarged abdominal or pelvic lymph nodes. Reproductive: Prostate is unremarkable. Other: No abdominal wall hernia or abnormality. No abdominopelvic ascites. Musculoskeletal: Degenerative changes of lumbar spine are seen. Stable small lucencies are noted throughout pelvic structures similar to that seen on the prior exam. IMPRESSION: Significant right-sided hydronephrosis similar to that seen on the prior exam although no definitive calcified stone is noted. These changes may be related to abnormality at the right UVJ or possibly edema from a recently passed stone or poorly calcified stone. No other focal abnormality is noted. Electronically Signed   By: Violeta Grey M.D.   On: 10/20/2023 22:33    Microbiology: Results for orders placed or performed during the hospital encounter of 10/20/23  Urine Culture     Status: Abnormal   Collection Time: 10/20/23  2:45  PM   Specimen: Urine, Clean Catch  Result Value Ref Range Status   Specimen Description   Final    URINE, CLEAN CATCH Performed at [facility], [address], [place], [place] [address]    Special Requests   Final    NONE Performed at [facility], [address], [place], [place] [address]    Culture >=100,000 COLONIES/mL ENTEROBACTER CLOACAE (A)  Final   Report Status 10/22/2023 FINAL  Final   Organism ID, Bacteria ENTEROBACTER CLOACAE (A)  Final      Susceptibility   Enterobacter cloacae - MIC*    CEFEPIME <=0.12 SENSITIVE Sensitive     CIPROFLOXACIN  <=0.25 SENSITIVE Sensitive     GENTAMICIN <=1 SENSITIVE Sensitive     IMIPENEM <=0.25 SENSITIVE Sensitive     NITROFURANTOIN 64 INTERMEDIATE Intermediate     TRIMETH /SULFA  <=20 SENSITIVE Sensitive     PIP/TAZO <=4 SENSITIVE Sensitive ug/mL    * >=100,000 COLONIES/mL ENTEROBACTER CLOACAE  Culture, blood (routine x 2)     Status: None (Preliminary result)   Collection Time: 10/20/23  5:36 PM   Specimen: BLOOD LEFT HAND  Result Value Ref Range Status   Specimen Description BLOOD LEFT HAND  Final   Special Requests   Final    BOTTLES DRAWN AEROBIC AND ANAEROBIC Blood Culture adequate volume   Culture   Final    NO GROWTH 2 DAYS Performed at [facility], [address], [place], [place] [address]    Report Status PENDING  Incomplete  Culture, blood (routine x 2)  Status: None (Preliminary result)   Collection Time: 10/20/23  5:36 PM   Specimen: BLOOD RIGHT ARM  Result Value Ref Range Status   Specimen Description BLOOD RIGHT ARM  Final   Special Requests   Final    BOTTLES DRAWN AEROBIC AND ANAEROBIC Blood Culture adequate volume   Culture   Final    NO GROWTH 2 DAYS Performed at [facility], [address], [place], [place] [address]    Report Status PENDING  Incomplete    Labs: CBC: Recent Labs  Lab 10/20/23 1505 10/21/23 0539  WBC 16.9* 12.3*  NEUTROABS 14.0*  --   HGB 14.9 13.7  HCT 47.0 41.9  MCV 89.4  89.9  PLT 175 184   Basic Metabolic Panel: Recent Labs  Lab 10/20/23 1505 10/21/23 0539  NA 134* 137  K 4.0 4.3  CL 103 107  CO2 22 22  GLUCOSE 144* 131*  BUN 34* 38*  CREATININE 1.38* 1.48*  CALCIUM  9.3 8.7*   Liver Function Tests: Recent Labs  Lab 10/20/23 1505  AST 18  ALT 17  ALKPHOS 76  BILITOT 0.5  PROT 7.7  ALBUMIN 3.9   CBG: Recent Labs  Lab 10/21/23 1122 10/21/23 1608 10/21/23 2123 10/22/23 0737 10/22/23 1109  GLUCAP 171* 161* 166* 130* 179*    Discharge time spent: greater than 30 minutes.  Signed: Justina Oman, MD [facility] 10/22/2023

## 2023-10-22 NOTE — Plan of Care (Signed)
   Problem: Education: Goal: Knowledge of General Education information will improve Description: Including pain rating scale, medication(s)/side effects and non-pharmacologic comfort measures Outcome: Progressing   Problem: Clinical Measurements: Goal: Ability to maintain clinical measurements within normal limits will improve Outcome: Progressing Goal: Diagnostic test results will improve Outcome: Progressing

## 2023-10-22 NOTE — Evaluation (Signed)
 Clinical/Bedside Swallow Evaluation Patient Details  Name: Wesley Ingram MRN: [id number] Date of Birth: 1945/11/14  Today's Date: 10/22/2023 Time: SLP Start Time (ACUTE ONLY): 1010 SLP Stop Time (ACUTE ONLY): 1029 SLP Time Calculation (min) (ACUTE ONLY): 19 min  Past Medical History:  Past Medical History:  Diagnosis Date   Diabetes mellitus    DVT (deep venous thrombosis) (HCC)    Foot ulcer due to secondary DM (HCC)    High triglycerides    Hypertension    PVD (peripheral vascular disease) (HCC)    Renal disorder    Stroke [facility]) 2008   left hemiparesis   Past Surgical History:  Past Surgical History:  Procedure Laterality Date   AMPUTATION Right 01/25/2019   Procedure: AMPUTATION RIGHT GREAT TOE;  Surgeon: Margherita Shell, MD;  Location: [facility] OR;  Service: Vascular;  Laterality: Right;   COLONOSCOPY  2009   [facility] in [place]: diverticulosis, next TCS in 07/2017   CYSTOSCOPY WITH RETROGRADE PYELOGRAM, URETEROSCOPY AND STENT PLACEMENT Right 11/19/2016   Procedure: CYSTOSCOPY WITH RETROGRADE PYELOGRAM, URETEROSCOPY AND STENT EXCHANGE;  Surgeon: Osborn Blaze, MD;  Location: [facility] ORS;  Service: Urology;  Laterality: Right;   CYSTOSCOPY WITH STENT PLACEMENT Right 10/19/2016   Procedure: CYSTOSCOPY WITH STENT PLACEMENT;  Surgeon: Osborn Blaze, MD;  Location: [facility]S;  Service: Urology;  Laterality: Right;   ENDARTERECTOMY     Left popliteal artery endarterectomy with left above-knee popliteal artery to tibial peroneal trunk bypass with reverse saphenous vein   HERNIA REPAIR     bilateral inguinal   HOLMIUM LASER APPLICATION Right 11/19/2016   Procedure: HOLMIUM LASER APPLICATION;  Surgeon: Osborn Blaze, MD;  Location: [facility] ORS;  Service: Urology;  Laterality: Right;   TOE AMPUTATION     TONSILLECTOMY     HPI:  Wesley Ingram  is a 78 y.o. male, with medical history significant for diabetes mellitus, hypertension, stroke with left-sided hemiparesis, DVT on chronic  anticoagulation, chronic Foley catheter, multiple UTIs , with history of kidney stones, used to follow with Dr. Secundino Dach in the past, but [facility]. Pt is on a D2/HTL diet -BSE requested    Assessment / Plan / Recommendation  Clinical Impression  Clinical swallowing evaluation completed while Pt was sitting upright in bed. Spoke with Pt's wife via phone who reports liquids are "slightly thick" at home and have been thickend for a very long time. Assessed Pt with HTL and NTL and Pt without s/sx of aspiration with either. Unfortunately no ST hx accessible in our chart. Pt consumed puree without incident. Recommend upgrade Pt's diet to NTL and continue with D2/fine chop. Continue meds whole with applesauce. ST will f/u X1 to ensure diet tolerance. Above to RN.  Acknowledge SLE order; Pt has baseline dementia exacerbated by UTI. Aggressive behavior has improved and Pt is near cognitive baseline. SLE not indicated at this time. Communicated with MD who is in agreement. Will complete order for SLE. Thank you,   SLP Visit Diagnosis: Dysphagia, unspecified (R13.10)    Aspiration Risk  Mild aspiration risk    Diet Recommendation Dysphagia 2 (Fine chop);Nectar-thick liquid    Liquid Administration via: Cup;Straw Medication Administration: Whole meds with puree Supervision: Patient able to self feed Compensations: Slow rate;Small sips/bites Postural Changes: Seated upright at 90 degrees    Other  Recommendations Oral Care Recommendations: Oral care BID    Recommendations for follow up therapy are one component of a multi-disciplinary discharge planning process, led by the attending  physician.  Recommendations may be updated based on patient status, additional functional criteria and insurance authorization.  Follow up Recommendations No SLP follow up      Assistance Recommended at Discharge    Functional Status Assessment    Frequency and Duration min 1 x/week  1 week        Prognosis Prognosis for improved oropharyngeal function: Fair Barriers to Reach Goals: Cognitive deficits      Swallow Study   General Date of Onset: 10/20/23 HPI: Wesley Ingram  is a 78 y.o. male, with medical history significant for diabetes mellitus, hypertension, stroke with left-sided hemiparesis, DVT on chronic anticoagulation, chronic Foley catheter, multiple UTIs , with history of kidney stones, used to follow with Dr. Secundino Dach in the past, but [facility]. Pt is on a D2/HTL diet -BSE requested Type of Study: Bedside Swallow Evaluation Previous Swallow Assessment: none in chart Diet Prior to this Study: Dysphagia 3 (mechanical soft);Thin liquids (Level 0) Temperature Spikes Noted: No Respiratory Status: Room air History of Recent Intubation: No Behavior/Cognition: Alert;Cooperative;Pleasant mood Oral Cavity Assessment: Within Functional Limits Oral Care Completed by SLP: Recent completion by staff Oral Cavity - Dentition: Missing dentition Vision: Functional for self-feeding Self-Feeding Abilities: Able to feed self;Needs assist;Needs set up Patient Positioning: Upright in bed Baseline Vocal Quality: Normal Volitional Cough: Strong Volitional Swallow: Able to elicit    Oral/Motor/Sensory Function Overall Oral Motor/Sensory Function: Within functional limits   Ice Chips Ice chips: Not tested   Thin Liquid Thin Liquid: Not tested    Nectar Thick Nectar Thick Liquid: Within functional limits   Honey Thick Honey Thick Liquid: Within functional limits   Puree Puree: Within functional limits   Solid     Solid: Not tested     Anuj Summons H. Vergil Glasser, CCC-SLP Speech Language Pathologist  Florina Husbands 10/22/2023,10:31 AM

## 2023-10-25 LAB — CULTURE, BLOOD (ROUTINE X 2)
Culture: NO GROWTH
Culture: NO GROWTH
Special Requests: ADEQUATE
Special Requests: ADEQUATE

## 2023-11-21 ENCOUNTER — Emergency Department (HOSPITAL_COMMUNITY)
Admission: EM | Admit: 2023-11-21 | Discharge: 2023-11-22 | Disposition: A | Discharge status: Home / Self Care | Attending: Emergency Medicine | Admitting: Emergency Medicine

## 2023-11-21 ENCOUNTER — Encounter (HOSPITAL_COMMUNITY): Payer: Self-pay

## 2023-11-21 ENCOUNTER — Emergency Department (HOSPITAL_COMMUNITY)

## 2023-11-21 ENCOUNTER — Other Ambulatory Visit: Payer: Self-pay

## 2023-11-21 DIAGNOSIS — T83511A Infection and inflammatory reaction due to indwelling urethral catheter, initial encounter: Secondary | ICD-10-CM | POA: Insufficient documentation

## 2023-11-21 DIAGNOSIS — Z8673 Personal history of transient ischemic attack (TIA), and cerebral infarction without residual deficits: Secondary | ICD-10-CM | POA: Diagnosis not present

## 2023-11-21 DIAGNOSIS — N390 Urinary tract infection, site not specified: Secondary | ICD-10-CM | POA: Diagnosis not present

## 2023-11-21 DIAGNOSIS — Z7982 Long term (current) use of aspirin: Secondary | ICD-10-CM | POA: Diagnosis not present

## 2023-11-21 DIAGNOSIS — Z7901 Long term (current) use of anticoagulants: Secondary | ICD-10-CM | POA: Diagnosis not present

## 2023-11-21 DIAGNOSIS — R109 Unspecified abdominal pain: Secondary | ICD-10-CM | POA: Diagnosis present

## 2023-11-21 DIAGNOSIS — R739 Hyperglycemia, unspecified: Secondary | ICD-10-CM | POA: Diagnosis not present

## 2023-11-21 LAB — BASIC METABOLIC PANEL WITH GFR
Anion gap: 9 (ref 5–15)
BUN: 29 mg/dL — ABNORMAL HIGH (ref 8–23)
CO2: 22 mmol/L (ref 22–32)
Calcium: 8.9 mg/dL (ref 8.9–10.3)
Chloride: 106 mmol/L (ref 98–111)
Creatinine, Ser: 1.35 mg/dL — ABNORMAL HIGH (ref 0.61–1.24)
GFR, Estimated: 54 mL/min — ABNORMAL LOW (ref >60)
Glucose, Bld: 118 mg/dL — ABNORMAL HIGH (ref 70–99)
Potassium: 3.9 mmol/L (ref 3.5–5.1)
Sodium: 137 mmol/L (ref 135–145)

## 2023-11-21 LAB — URINALYSIS, W/ REFLEX TO CULTURE (INFECTION SUSPECTED)
Bilirubin Urine: NEGATIVE
Glucose, UA: NEGATIVE mg/dL
Ketones, ur: NEGATIVE mg/dL
Nitrite: NEGATIVE
Protein, ur: 30 mg/dL — AB
Specific Gravity, Urine: 1.004 — ABNORMAL LOW (ref 1.005–1.030)
WBC, UA: >50 WBC/hpf (ref 0–5)
pH: 6 (ref 5.0–8.0)

## 2023-11-21 LAB — CBC WITH DIFFERENTIAL/PLATELET
Abs Immature Granulocytes: 0.37 10*3/uL — ABNORMAL HIGH (ref 0.00–0.07)
Basophils Absolute: 0.1 10*3/uL (ref 0.0–0.1)
Basophils Relative: 1 %
Eosinophils Absolute: 0.5 10*3/uL (ref 0.0–0.5)
Eosinophils Relative: 6 %
HCT: 41.6 % (ref 39.0–52.0)
Hemoglobin: 13.7 g/dL (ref 13.0–17.0)
Immature Granulocytes: 4 %
Lymphocytes Relative: 21 %
Lymphs Abs: 1.9 10*3/uL (ref 0.7–4.0)
MCH: 29.2 pg (ref 26.0–34.0)
MCHC: 32.9 g/dL (ref 30.0–36.0)
MCV: 88.7 fL (ref 80.0–100.0)
Monocytes Absolute: 1.1 10*3/uL — ABNORMAL HIGH (ref 0.1–1.0)
Monocytes Relative: 12 %
Neutro Abs: 5.4 10*3/uL (ref 1.7–7.7)
Neutrophils Relative %: 56 %
Platelets: 229 10*3/uL (ref 150–400)
RBC: 4.69 MIL/uL (ref 4.22–5.81)
RDW: 14.3 % (ref 11.5–15.5)
WBC: 9.4 10*3/uL (ref 4.0–10.5)
nRBC: 0 % (ref 0.0–0.2)

## 2023-11-21 MED ORDER — CIPROFLOXACIN HCL 500 MG PO TABS: 500.0000 mg | ORAL_TABLET | Freq: Two times a day (BID) | ORAL | 0 refills | Status: AC

## 2023-11-21 MED ORDER — CIPROFLOXACIN HCL 250 MG PO TABS
500.0000 mg | ORAL_TABLET | Freq: Once | ORAL | Status: AC
  Administered 2023-11-21: 500 mg via ORAL
  Filled 2023-11-21: qty 2

## 2023-11-21 NOTE — Discharge Instructions (Addendum)
 You are seen in the emergency room today for a urinary tract infection associated with your catheter.  We are putting you on antibiotics.  We have sent your urine for culture.  You to follow-up with your PCP and/your urologist in 2 to 3 days for culture results to make sure you are on the right antibiotics.  We have replaced your catheter.  Back to the ER for new or worsening symptoms, specially fever, pain, vomiting or other worrisome changes.

## 2023-11-21 NOTE — ED Notes (Signed)
 Pt foley removed to replace and obtain UA.  Pt is refusing to have catheter placed back.

## 2023-11-21 NOTE — ED Triage Notes (Signed)
 Pt reports his wife wanted him to come to the ER to be checked for a UTI since his urine in his foley bag looked cloudy.

## 2023-11-21 NOTE — ED Provider Notes (Cosign Needed Addendum)
   Patient signed out to me by Baxter Limber, PA-C pending CT renal stone study results and reassessment of patient after Foley catheter placement.  She was initially brought in by his wife for evaluation of possible UTI.  He has a chronic indwelling Foley catheter.  He reported to me that his urine appeared cloudy earlier today and wife was concerned about infection.  Patient has history of dementia and cannot provide relevant historical information.  States to me that he is ready for discharge home and is wanting someone to call his wife.  Please see previous provider note for complete H&P  Foley catheter was removed to obtain urinalysis and he was initially hesitant to have Foley catheter replaced but ultimately agreed.  There was successful placement of Foley catheter and he currently has about 1500 cc of straw-colored urine in the Foley catheter bag.  He states he is feeling better he denies any pain at this time.  CT renal stone study was obtained that shows progressive hydronephrosis and hydroureter with left greater than right without evidence of stone the bladder wall is thickened and trabeculated suggesting a chronic bladder outlet obstruction.  I spoke with patient's wife via telephone and explained test results and that he will be prescribed antibiotics.  She verbalized understanding and states that he will be following up with his urologist and the [place] as they are determining if he will be a surgical candidate for a suprapubic catheter.    Catherne Clubs, PA-C 11/21/23 1847    Catherne Clubs, PA-C 11/21/23 Sofia Dunn, MD 11/22/23 1124

## 2023-11-21 NOTE — ED Provider Notes (Signed)
 Wesley Ingram   CSN: [id number] Arrival date & time: 11/21/23  1115     History  Chief Complaint  Patient presents with   Urinary Tract Infection    Wesley Ingram is a 78 y.o. male.  Presents the ER today for evaluation of urine.  He has history of kidney stones, UTI, diabetes, DVT on anticoagulation and stroke.  His wife noticed that his Foley catheter looked cloudy and brought in by ER for possible UTI, she reports it was last exchanged ago.  He is not fever or chills.  EMS report he is complaining of some left flank pain but patient denies any pain at this time.   Urinary Tract Infection      Home Medications Prior to Admission medications   Medication Sig Start Date End Date Taking? Authorizing Provider  apixaban  (ELIQUIS ) 5 MG TABS tablet Take 1 tablet (5 mg total) by mouth 2 (two) times daily. Patient taking differently: Take 2.5 mg by mouth 2 (two) times daily. 02/15/19   Marilyne Shu, NP  aspirin  81 MG chewable tablet Chew 81 mg by mouth daily. 11/08/14   [provider]  atorvastatin  (LIPITOR) 20 MG tablet Take 1 tablet (20 mg total) by mouth every morning. Patient taking differently: Take 10 mg by mouth daily. 02/15/19   Marilyne Shu, NP  guaiFENesin  (MUCINEX ) 600 MG 12 hr tablet Take 600 mg by mouth 2 (two) times daily. 12/24/22   [provider]  metoprolol  succinate (TOPROL -XL) 25 MG 24 hr tablet Take 0.5 tablets (12.5 mg total) by mouth daily. Patient taking differently: Take 25 mg by mouth daily. 02/15/19   Marilyne Shu, NP  polyethylene glycol (MIRALAX  / GLYCOLAX ) 17 g packet Take 17 g by mouth daily as needed for mild constipation.    [provider]  QUEtiapine  (SEROQUEL ) 25 MG tablet Take 0.5 tablets (12.5 mg total) by mouth at bedtime. 10/22/23   Justina Oman, MD  senna-docusate (SENOKOT-S) 8.6-50 MG tablet Take 2 tablets by mouth at bedtime. 05/03/23   Colin Dawley,  MD  sitaGLIPtin (JANUVIA) 25 MG tablet Take 25 mg by mouth daily.    [provider]  tamsulosin  (FLOMAX ) 0.4 MG CAPS capsule Take 0.4 mg by mouth daily after supper.    [provider]      Allergies    Patient has no known allergies.    Review of Systems   Review of Systems  Physical Exam Updated Vital Signs BP 114/63 (BP Location: Left Arm)   Pulse (!) 56   Temp 98 F (36.7 C) (Oral)   Resp 16   Ht 5\' 10"  (1.778 m)   Wt 68 kg   SpO2 100%   BMI 21.51 kg/m  Physical Exam Vitals and nursing Ingram reviewed.  Constitutional:      General: He is not in acute distress.    Appearance: He is well-developed.  HENT:     Head: Normocephalic and atraumatic.  Eyes:     Conjunctiva/sclera: Conjunctivae normal.  Cardiovascular:     Rate and Rhythm: Normal rate and regular rhythm.     Heart sounds: No murmur heard. Pulmonary:     Effort: Pulmonary effort is normal. No respiratory distress.     Breath sounds: Normal breath sounds.  Abdominal:     Palpations: Abdomen is soft.     Tenderness: There is no abdominal tenderness.  Genitourinary:    Penis: Normal.   Musculoskeletal:  General: No swelling.     Cervical back: Neck supple.  Skin:    General: Skin is warm and dry.     Capillary Refill: Capillary refill takes less than 2 seconds.  Neurological:     General: No focal deficit present.     Mental Status: He is alert and oriented to person, place, and time.  Psychiatric:        Mood and Affect: Mood normal.     ED Results / Procedures / Treatments   Labs (all labs ordered are listed, but only abnormal results are displayed) Labs Reviewed  URINALYSIS, W/ REFLEX TO CULTURE (INFECTION SUSPECTED) - Abnormal; Notable for the following components:      Result Value   Color, Urine STRAW (*)    APPearance HAZY (*)    Specific Gravity, Urine 1.004 (*)    Hgb urine dipstick SMALL (*)    Protein, ur 30 (*)    Leukocytes,Ua LARGE (*)    Bacteria, UA  RARE (*)    All other components within normal limits  CBC WITH DIFFERENTIAL/PLATELET - Abnormal; Notable for the following components:   Monocytes Absolute 1.1 (*)    Abs Immature Granulocytes 0.37 (*)    All other components within normal limits  BASIC METABOLIC PANEL WITH GFR - Abnormal; Notable for the following components:   Glucose, Bld 118 (*)    BUN 29 (*)    Creatinine, Ser 1.35 (*)    GFR, Estimated 54 (*)    All other components within normal limits  URINE CULTURE    EKG None  Radiology No results found.  Procedures Procedures    Medications Ordered in ED Medications  ciprofloxacin  (CIPRO ) tablet 500 mg (has no administration in time range)    ED Course/ Medical Decision Making/ A&P                                 Medical Decision Making Frontal diagnosis includes but not limited to UTI, pyelonephritis, kidney stone, BPH, prostatitis, other  Course: Patient was brought in by his wife for cloudy urine.  She is his primary caretaker, was worried about UTI.  Also reporting some flank pain but patient denied this in the ED though he does have some left lower quadrant tenderness on exam, wife is not at bedside at this time, he has not followed up with PCP for repeat labs since his last discharge a month ago also repeated labs and obtain CT scan, labs show improved renal function, no leukocytosis, no anemia.  He does have a UTI, prior culture shows sensitivity to cefepime, Zosyn, Cipro , and Bactrim .  CT is pending, will order Cipro  for UTI at this time, assuming CT is normal, he will be discharged with Cipro .  Given his CKD will avoid Bactrim . Patient is now agreeable with replacing catheter, initially was hesitant.  Signed out to [employer], PA-C   Amount and/or Complexity of Data Reviewed Labs: ordered. Radiology: ordered.  Risk Prescription drug management.           Final Clinical Impression(s) / ED Diagnoses Final diagnoses:  Urinary tract  infection associated with indwelling urethral catheter, initial encounter [facility])    Rx / DC Orders ED Discharge Orders     None         Aimee Houseman, PA-C 11/21/23 1702    Teddi Favors, DO 11/22/23 [phone]

## 2023-11-22 DIAGNOSIS — R6889 Other general symptoms and signs: Secondary | ICD-10-CM | POA: Diagnosis not present

## 2023-11-22 DIAGNOSIS — Z743 Need for continuous supervision: Secondary | ICD-10-CM | POA: Diagnosis not present

## 2023-11-22 MED ORDER — CIPROFLOXACIN HCL 250 MG PO TABS
500.0000 mg | ORAL_TABLET | Freq: Two times a day (BID) | ORAL | Status: DC
  Administered 2023-11-22: 500 mg via ORAL
  Filled 2023-11-22: qty 2

## 2023-11-22 NOTE — ED Notes (Signed)
 Pt given cup of water

## 2023-11-22 NOTE — ED Notes (Signed)
 Patient ate breakfast

## 2023-11-23 LAB — URINE CULTURE: Culture: >=100000 — AB

## 2023-11-24 ENCOUNTER — Telehealth (HOSPITAL_BASED_OUTPATIENT_CLINIC_OR_DEPARTMENT_OTHER): Payer: Self-pay | Admitting: *Deleted

## 2023-11-24 NOTE — Telephone Encounter (Signed)
 Post ED Visit - Positive Culture Follow-up  Culture report reviewed by antimicrobial stewardship pharmacist: [facility] [x]  [place], [place].D. []  Skeet Duke, Pharm.D., BCPS AQ-ID []  Leslee Rase, Pharm.D., BCPS []  Garland Junk, Pharm.D., BCPS []  [place], [address].D., BCPS, AAHIVP []  Alcide Aly, Pharm.D., BCPS, AAHIVP []  Jerri Morale, PharmD, BCPS []  Graham Laws, PharmD, BCPS []  Cleda Curly, PharmD, BCPS []  Tamar Fairly, PharmD []  Ballard Levels, PharmD, BCPS []  Ollen Beverage, PharmD  [facility] []  Arlyne Bering, PharmD []  Sherryle Don, PharmD []  [place], PharmD []  Delila Felty, Rph []  [place]) Cleora Daft, PharmD []  Augustina Block, PharmD []  Arie Kurtz, PharmD []  Sharlyn Deaner, PharmD []  Agnes Hose, PharmD []  Kendall Pauls, PharmD []  Gladstone Lamer, PharmD []  Armanda Bern, PharmD []  Tera Fellows, PharmD   Positive urine culture, collected 11/21/23. Treated with ciprofloxacin , organism sensitive to the same and no further patient follow-up is required at this time.  Zeb Heys 11/24/2023, 10:28 AM

## 2024-01-16 DIAGNOSIS — R531 Weakness: Secondary | ICD-10-CM | POA: Diagnosis not present

## 2024-01-26 ENCOUNTER — Emergency Department (HOSPITAL_COMMUNITY)

## 2024-01-26 ENCOUNTER — Other Ambulatory Visit: Payer: Self-pay

## 2024-01-26 ENCOUNTER — Emergency Department (HOSPITAL_COMMUNITY)
Admission: EM | Admit: 2024-01-26 | Discharge: 2024-01-26 | Disposition: A | Discharge status: Home / Self Care | Attending: Emergency Medicine | Admitting: Emergency Medicine

## 2024-01-26 ENCOUNTER — Encounter (HOSPITAL_COMMUNITY): Payer: Self-pay

## 2024-01-26 DIAGNOSIS — Z7901 Long term (current) use of anticoagulants: Secondary | ICD-10-CM | POA: Diagnosis not present

## 2024-01-26 DIAGNOSIS — N390 Urinary tract infection, site not specified: Secondary | ICD-10-CM

## 2024-01-26 DIAGNOSIS — Z743 Need for continuous supervision: Secondary | ICD-10-CM | POA: Diagnosis not present

## 2024-01-26 DIAGNOSIS — Z7982 Long term (current) use of aspirin: Secondary | ICD-10-CM | POA: Insufficient documentation

## 2024-01-26 DIAGNOSIS — R531 Weakness: Secondary | ICD-10-CM | POA: Diagnosis not present

## 2024-01-26 DIAGNOSIS — M79602 Pain in left arm: Secondary | ICD-10-CM | POA: Diagnosis present

## 2024-01-26 LAB — CBC WITH DIFFERENTIAL/PLATELET
Abs Immature Granulocytes: 0.22 K/uL — ABNORMAL HIGH (ref 0.00–0.07)
Basophils Absolute: 0.1 K/uL (ref 0.0–0.1)
Basophils Relative: 1 %
Eosinophils Absolute: 0.5 K/uL (ref 0.0–0.5)
Eosinophils Relative: 4 %
HCT: 38.6 % — ABNORMAL LOW (ref 39.0–52.0)
Hemoglobin: 12.8 g/dL — ABNORMAL LOW (ref 13.0–17.0)
Immature Granulocytes: 2 %
Lymphocytes Relative: 13 %
Lymphs Abs: 1.4 K/uL (ref 0.7–4.0)
MCH: 29.9 pg (ref 26.0–34.0)
MCHC: 33.2 g/dL (ref 30.0–36.0)
MCV: 90.2 fL (ref 80.0–100.0)
Monocytes Absolute: 1.1 K/uL — ABNORMAL HIGH (ref 0.1–1.0)
Monocytes Relative: 11 %
Neutro Abs: 7.3 K/uL (ref 1.7–7.7)
Neutrophils Relative %: 69 %
Platelets: 237 K/uL (ref 150–400)
RBC: 4.28 MIL/uL (ref 4.22–5.81)
RDW: 14.1 % (ref 11.5–15.5)
WBC: 10.6 K/uL — ABNORMAL HIGH (ref 4.0–10.5)
nRBC: 0 % (ref 0.0–0.2)

## 2024-01-26 LAB — COMPREHENSIVE METABOLIC PANEL WITH GFR
ALT: 15 U/L (ref 0–44)
AST: 16 U/L (ref 15–41)
Albumin: 3.3 g/dL — ABNORMAL LOW (ref 3.5–5.0)
Alkaline Phosphatase: 68 U/L (ref 38–126)
Anion gap: 11 (ref 5–15)
BUN: 31 mg/dL — ABNORMAL HIGH (ref 8–23)
CO2: 24 mmol/L (ref 22–32)
Calcium: 8.5 mg/dL — ABNORMAL LOW (ref 8.9–10.3)
Chloride: 103 mmol/L (ref 98–111)
Creatinine, Ser: 1.45 mg/dL — ABNORMAL HIGH (ref 0.61–1.24)
GFR, Estimated: 50 mL/min — ABNORMAL LOW (ref >60)
Glucose, Bld: 176 mg/dL — ABNORMAL HIGH (ref 70–99)
Potassium: 4.3 mmol/L (ref 3.5–5.1)
Sodium: 138 mmol/L (ref 135–145)
Total Bilirubin: 0.5 mg/dL (ref 0.0–1.2)
Total Protein: 6.9 g/dL (ref 6.5–8.1)

## 2024-01-26 LAB — URINALYSIS, W/ REFLEX TO CULTURE (INFECTION SUSPECTED)
Bilirubin Urine: NEGATIVE
Glucose, UA: NEGATIVE mg/dL
Ketones, ur: NEGATIVE mg/dL
Nitrite: POSITIVE — AB
Protein, ur: 100 mg/dL — AB
Specific Gravity, Urine: 1.014 (ref 1.005–1.030)
WBC, UA: >50 WBC/hpf (ref 0–5)
pH: 7 (ref 5.0–8.0)

## 2024-01-26 LAB — TROPONIN I (HIGH SENSITIVITY): Troponin I (High Sensitivity): 6 ng/L (ref <18)

## 2024-01-26 MED ORDER — CIPROFLOXACIN HCL 500 MG PO TABS: 500.0000 mg | ORAL_TABLET | Freq: Two times a day (BID) | ORAL | 0 refills | Status: DC

## 2024-01-26 MED ORDER — CIPROFLOXACIN IN D5W 400 MG/200ML IV SOLN
400.0000 mg | Freq: Once | INTRAVENOUS | Status: AC
  Administered 2024-01-26: 400 mg via INTRAVENOUS
  Filled 2024-01-26: qty 200

## 2024-01-26 MED ORDER — CIPROFLOXACIN HCL 500 MG PO TABS: 500.0000 mg | ORAL_TABLET | Freq: Two times a day (BID) | ORAL | 0 refills | Status: AC

## 2024-01-26 NOTE — ED Provider Notes (Signed)
 [place] EMERGENCY DEPARTMENT AT [facility] Provider Note   CSN: [id number] Arrival date & time: 01/26/24  1532     Patient presents with: Weakness   Wesley Ingram is a 78 y.o. male.   Patient is a 78 year old male who presents to the emergency department the chief complaint of generalized weakness and intermittent left arm pain.  Patient does have a Foley catheter in place and has a history of frequent urinary tract infections.  He denies any active chest pain, shortness of breath, abdominal pain, nausea, vomiting, diarrhea.  There is no associated dizziness, lightheadedness or syncope.   Weakness      Prior to Admission medications   Medication Sig Start Date End Date Taking? Authorizing Provider  apixaban  (ELIQUIS ) 5 MG TABS tablet Take 1 tablet (5 mg total) by mouth 2 (two) times daily. Patient taking differently: Take 2.5 mg by mouth 2 (two) times daily. 02/15/19   Landy Barnie RAMAN, NP  Ascorbic Acid (VITAMIN C) 1000 MG tablet Take 1,000 mg by mouth daily.    [provider]  aspirin  81 MG chewable tablet Chew 81 mg by mouth daily. 11/08/14   [provider]  atorvastatin  (LIPITOR) 20 MG tablet Take 1 tablet (20 mg total) by mouth every morning. Patient taking differently: Take 40 mg by mouth daily. 02/15/19   Landy Barnie RAMAN, NP  b complex vitamins capsule Take 1 capsule by mouth daily.    [provider]  guaiFENesin  (MUCINEX ) 600 MG 12 hr tablet Take 600 mg by mouth 2 (two) times daily. 12/24/22   [provider]  metoprolol  succinate (TOPROL -XL) 25 MG 24 hr tablet Take 0.5 tablets (12.5 mg total) by mouth daily. Patient taking differently: Take 25 mg by mouth daily. 02/15/19   Landy Barnie RAMAN, NP  polyethylene glycol (MIRALAX  / GLYCOLAX ) 17 g packet Take 17 g by mouth daily as needed for mild constipation.    [provider]  QUEtiapine  (SEROQUEL ) 25 MG tablet Take 0.5 tablets (12.5 mg total) by mouth at bedtime.  10/22/23   Ricky Fines, MD  senna-docusate (SENOKOT-S) 8.6-50 MG tablet Take 2 tablets by mouth at bedtime. Patient taking differently: Take 1 tablet by mouth 2 (two) times daily. 05/03/23   Pearlean Manus, MD  sitaGLIPtin (JANUVIA) 25 MG tablet Take 25 mg by mouth daily.    [provider]  tamsulosin  (FLOMAX ) 0.4 MG CAPS capsule Take 0.4 mg by mouth daily after supper.    [provider]    Allergies: Patient has no known allergies.    Review of Systems  Neurological:  Positive for weakness.  All other systems reviewed and are negative.   Updated Vital Signs BP 127/68   Pulse 65   Temp 97.8 F (36.6 C) (Oral)   Resp 19   Ht 5' 10 (1.778 m)   Wt 74.8 kg   SpO2 97%   BMI 23.68 kg/m   Physical Exam Vitals and nursing note reviewed.  Constitutional:      Appearance: Normal appearance.  HENT:     Head: Normocephalic and atraumatic.     Nose: Nose normal.     Mouth/Throat:     Mouth: Mucous membranes are moist.  Eyes:     Extraocular Movements: Extraocular movements intact.     Conjunctiva/sclera: Conjunctivae normal.     Pupils: Pupils are equal, round, and reactive to light.  Cardiovascular:     Rate and Rhythm: Normal rate and regular rhythm.  Pulses: Normal pulses.     Heart sounds: Normal heart sounds. No murmur heard.    No gallop.  Pulmonary:     Effort: Pulmonary effort is normal. No respiratory distress.     Breath sounds: Normal breath sounds. No stridor. No wheezing, rhonchi or rales.  Abdominal:     General: Abdomen is flat. Bowel sounds are normal. There is no distension.     Palpations: Abdomen is soft.     Tenderness: There is no abdominal tenderness. There is no guarding.  Musculoskeletal:        General: Normal range of motion.     Cervical back: Normal range of motion and neck supple.     Right lower leg: No edema.     Left lower leg: No edema.  Skin:    General: Skin is warm and dry.     Findings: No bruising or  erythema.  Neurological:     General: No focal deficit present.     Mental Status: He is alert and oriented to person, place, and time. Mental status is at baseline.  Psychiatric:        Mood and Affect: Mood normal.        Behavior: Behavior normal.        Thought Content: Thought content normal.        Judgment: Judgment normal.     (all labs ordered are listed, but only abnormal results are displayed) Labs Reviewed  COMPREHENSIVE METABOLIC PANEL WITH GFR - Abnormal; Notable for the following components:      Result Value   Glucose, Bld 176 (*)    BUN 31 (*)    Creatinine, Ser 1.45 (*)    Calcium  8.5 (*)    Albumin 3.3 (*)    GFR, Estimated 50 (*)    All other components within normal limits  CBC WITH DIFFERENTIAL/PLATELET - Abnormal; Notable for the following components:   WBC 10.6 (*)    Hemoglobin 12.8 (*)    HCT 38.6 (*)    Monocytes Absolute 1.1 (*)    Abs Immature Granulocytes 0.22 (*)    All other components within normal limits  URINALYSIS, W/ REFLEX TO CULTURE (INFECTION SUSPECTED) - Abnormal; Notable for the following components:   Color, Urine AMBER (*)    APPearance TURBID (*)    Hgb urine dipstick MODERATE (*)    Protein, ur 100 (*)    Nitrite POSITIVE (*)    Leukocytes,Ua MODERATE (*)    Bacteria, UA FEW (*)    All other components within normal limits  URINE CULTURE  TROPONIN I (HIGH SENSITIVITY)    EKG: EKG Interpretation Date/Time:  Thursday January 26 2024 16:06:09 EDT Ventricular Rate:  68 PR Interval:    QRS Duration:  152 QT Interval:  435 QTC Calculation: 463 R Axis:   -47  Text Interpretation: Normal sinus rhythm RBBB and LAFB Artifact in lead(s) I III aVR aVL aVF V1 V2 V3 V4 V5 V6 Since last tracing rate slower Confirmed by Cleotilde Rogue ([id number]) on 01/26/2024 4:57:38 PM  Radiology: No results found.   Procedures   Medications Ordered in the ED  ciprofloxacin  (CIPRO ) IVPB 400 mg (has no administration in time range)                                     Medical Decision Making Amount and/or Complexity of Data Reviewed Labs: ordered. Radiology: ordered.  Risk Prescription drug management.   This patient presents to the ED for concern of generalized weakness, cloudy urine differential diagnosis includes urinary tract infection, pyelonephritis, sepsis, pneumonia    Additional history obtained:  Additional history obtained from family External records from outside source obtained and reviewed including medical records   Lab Tests:  I Ordered, and personally interpreted labs.  The pertinent results include: Mild leukocytosis, mild anemia, creatinine at baseline, normal electrolytes, normal liver function, negative troponin, urinalysis with moderate leukocytes and positive nitrite   Imaging Studies ordered:  I ordered imaging studies including chest x-ray I independently visualized and interpreted imaging which showed patchy airspace opacities I agree with the radiologist interpretation   Medicines ordered and prescription drug management:  I ordered medication including Cipro  for urinary tract infection Reevaluation of the patient after these medicines showed that the patient improved I have reviewed the patients home medicines and have made adjustments as needed   Problem List / ED Course:  Patient is doing well at this time and is stable for discharge home.  He is continue to have stable vital signs with no indication for sepsis.  Will cover for urinary tract infection at this time.  Will cover with ciprofloxacin  given his previous urine culture results.  Will send for urine for culture as well.  He has no changes in his kidney function and white blood cell count is at baseline.  Do not suspect pneumonia at this time as he has had no associated cough, congestion, fevers.  He has no focal neurological deficits on exam and no indication for CVA or TIA.  Did discuss his findings with his wife as well as the  plan for discharge.  She has voiced understanding to the plan at this time.  Strict turn precautions were discussed for any new or worsening symptoms.   Social Determinants of Health:  None        Final diagnoses:  None    ED Discharge Orders     None          Daralene Lonni JONETTA DEVONNA 01/26/24 2020    Cleotilde Rogue, MD 01/27/24 2006

## 2024-01-26 NOTE — Discharge Instructions (Signed)
 Please follow-up closely with your primary care doctor on an outpatient basis for reevaluation.  Return to emergency department immediately for any new or worsening symptoms.

## 2024-01-26 NOTE — ED Triage Notes (Signed)
 Pt BIB [facility] for generalized weakness and L arm pain 3/10. A&Ox4. Pt has a foley and has a hx of UTI and 2 strokes.   BP 119/68 P 70 O2 98 RA

## 2024-01-29 LAB — URINE CULTURE: Culture: >=100000 — AB

## 2024-06-21 ENCOUNTER — Encounter (HOSPITAL_COMMUNITY): Payer: Self-pay

## 2024-06-21 ENCOUNTER — Emergency Department (HOSPITAL_COMMUNITY)
Admission: EM | Admit: 2024-06-21 | Discharge: 2024-06-21 | Disposition: A | Discharge status: Home / Self Care | Attending: Emergency Medicine | Admitting: Emergency Medicine

## 2024-06-21 ENCOUNTER — Other Ambulatory Visit: Payer: Self-pay

## 2024-06-21 DIAGNOSIS — N183 Chronic kidney disease, stage 3 unspecified: Secondary | ICD-10-CM | POA: Insufficient documentation

## 2024-06-21 DIAGNOSIS — Z7982 Long term (current) use of aspirin: Secondary | ICD-10-CM | POA: Diagnosis not present

## 2024-06-21 DIAGNOSIS — I8291 Chronic embolism and thrombosis of unspecified vein: Secondary | ICD-10-CM | POA: Diagnosis not present

## 2024-06-21 DIAGNOSIS — Z7901 Long term (current) use of anticoagulants: Secondary | ICD-10-CM | POA: Diagnosis not present

## 2024-06-21 DIAGNOSIS — E1122 Type 2 diabetes mellitus with diabetic chronic kidney disease: Secondary | ICD-10-CM | POA: Diagnosis not present

## 2024-06-21 DIAGNOSIS — N3001 Acute cystitis with hematuria: Secondary | ICD-10-CM | POA: Insufficient documentation

## 2024-06-21 DIAGNOSIS — R319 Hematuria, unspecified: Secondary | ICD-10-CM | POA: Diagnosis present

## 2024-06-21 DIAGNOSIS — R310 Gross hematuria: Secondary | ICD-10-CM

## 2024-06-21 DIAGNOSIS — I129 Hypertensive chronic kidney disease with stage 1 through stage 4 chronic kidney disease, or unspecified chronic kidney disease: Secondary | ICD-10-CM | POA: Insufficient documentation

## 2024-06-21 LAB — BASIC METABOLIC PANEL WITH GFR
Anion gap: 14 (ref 5–15)
BUN: 34 mg/dL — ABNORMAL HIGH (ref 8–23)
CO2: 22 mmol/L (ref 22–32)
Calcium: 9.2 mg/dL (ref 8.9–10.3)
Chloride: 103 mmol/L (ref 98–111)
Creatinine, Ser: 1.67 mg/dL — ABNORMAL HIGH (ref 0.61–1.24)
GFR, Estimated: 42 mL/min — ABNORMAL LOW
Glucose, Bld: 166 mg/dL — ABNORMAL HIGH (ref 70–99)
Potassium: 4.2 mmol/L (ref 3.5–5.1)
Sodium: 140 mmol/L (ref 135–145)

## 2024-06-21 LAB — URINALYSIS, ROUTINE W REFLEX MICROSCOPIC
Bilirubin Urine: NEGATIVE
Glucose, UA: 50 mg/dL — AB
Ketones, ur: NEGATIVE mg/dL
Nitrite: NEGATIVE
Protein, ur: 100 mg/dL — AB
RBC / HPF: >50 RBC/hpf (ref 0–5)
Specific Gravity, Urine: 1.015 (ref 1.005–1.030)
WBC, UA: >50 WBC/hpf (ref 0–5)
pH: 5 (ref 5.0–8.0)

## 2024-06-21 LAB — CBC WITH DIFFERENTIAL/PLATELET
Abs Immature Granulocytes: 0.14 K/uL — ABNORMAL HIGH (ref 0.00–0.07)
Basophils Absolute: 0.1 K/uL (ref 0.0–0.1)
Basophils Relative: 1 %
Eosinophils Absolute: 0.6 K/uL — ABNORMAL HIGH (ref 0.0–0.5)
Eosinophils Relative: 6 %
HCT: 40.7 % (ref 39.0–52.0)
Hemoglobin: 13.3 g/dL (ref 13.0–17.0)
Immature Granulocytes: 1 %
Lymphocytes Relative: 16 %
Lymphs Abs: 1.6 K/uL (ref 0.7–4.0)
MCH: 29.9 pg (ref 26.0–34.0)
MCHC: 32.7 g/dL (ref 30.0–36.0)
MCV: 91.5 fL (ref 80.0–100.0)
Monocytes Absolute: 1.3 K/uL — ABNORMAL HIGH (ref 0.1–1.0)
Monocytes Relative: 13 %
Neutro Abs: 6.4 K/uL (ref 1.7–7.7)
Neutrophils Relative %: 63 %
Platelets: 195 K/uL (ref 150–400)
RBC: 4.45 MIL/uL (ref 4.22–5.81)
RDW: 13.8 % (ref 11.5–15.5)
WBC: 10.1 K/uL (ref 4.0–10.5)
nRBC: 0 % (ref 0.0–0.2)

## 2024-06-21 MED ORDER — SODIUM CHLORIDE 0.9 % IV SOLN
2.0000 g | Freq: Once | INTRAVENOUS | Status: AC
  Administered 2024-06-21: 2 g via INTRAVENOUS
  Filled 2024-06-21: qty 20

## 2024-06-21 MED ORDER — CEPHALEXIN 500 MG PO CAPS: 500.0000 mg | ORAL_CAPSULE | Freq: Four times a day (QID) | ORAL | 0 refills | Status: AC

## 2024-06-21 NOTE — Discharge Instructions (Addendum)
 Drink plenty of fluids Take antibiotics as directed.  Do not take eliquis  until Monday. Return if any problems.

## 2024-06-21 NOTE — ED Triage Notes (Signed)
 Pt arrived via [facility] form home c/o noticing hematuria in his foley catheter that was last changed on 06/01/24. Pt presents with left side deficits from previous stroke as well.

## 2024-06-21 NOTE — ED Provider Notes (Signed)
 " [place] EMERGENCY DEPARTMENT AT [facility] Provider Note   CSN: [id number] Arrival date & time: 06/21/24  1642     Patient presents with: Hematuria   Wesley Ingram is a 79 y.o. male.   Patient complains of blood in his urine.  Patient has a indwelling Foley.  Patient states that his bag has had blood today.  Patient is on Eliquis .  Patient currently denies any abdominal pain he states that he is hungry.  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  Patient has a past medical history of diabetes dyslipidemia, CVA hypertension stage III kidney disease and UTIs.  The history is provided by the patient. No language interpreter was used.  Hematuria       Prior to Admission medications  Medication Sig Start Date End Date Taking? Authorizing Provider  apixaban  (ELIQUIS ) 5 MG TABS tablet Take 1 tablet (5 mg total) by mouth 2 (two) times daily. Patient taking differently: Take 2.5 mg by mouth 2 (two) times daily. 02/15/19   Landy Barnie RAMAN, NP  Ascorbic Acid (VITAMIN C) 1000 MG tablet Take 1,000 mg by mouth daily.    [provider]  aspirin  81 MG chewable tablet Chew 81 mg by mouth daily. 11/08/14   [provider]  atorvastatin  (LIPITOR) 20 MG tablet Take 1 tablet (20 mg total) by mouth every morning. Patient taking differently: Take 40 mg by mouth daily. 02/15/19   Landy Barnie RAMAN, NP  b complex vitamins capsule Take 1 capsule by mouth daily.    [provider]  guaiFENesin  (MUCINEX ) 600 MG 12 hr tablet Take 600 mg by mouth 2 (two) times daily. 12/24/22   [provider]  metoprolol  succinate (TOPROL -XL) 25 MG 24 hr tablet Take 0.5 tablets (12.5 mg total) by mouth daily. Patient taking differently: Take 25 mg by mouth daily. 02/15/19   Landy Barnie RAMAN, NP  polyethylene glycol (MIRALAX  / GLYCOLAX ) 17 g packet Take 17 g by mouth daily as needed for mild constipation.    [provider]  QUEtiapine  (SEROQUEL ) 25 MG tablet  Take 0.5 tablets (12.5 mg total) by mouth at bedtime. 10/22/23   Ricky Fines, MD  senna-docusate (SENOKOT-S) 8.6-50 MG tablet Take 2 tablets by mouth at bedtime. Patient taking differently: Take 1 tablet by mouth 2 (two) times daily. 05/03/23   Pearlean Manus, MD  sitaGLIPtin (JANUVIA) 25 MG tablet Take 25 mg by mouth daily.    [provider]  tamsulosin  (FLOMAX ) 0.4 MG CAPS capsule Take 0.4 mg by mouth daily after supper.    [provider]    Allergies: Patient has no known allergies.    Review of Systems  Genitourinary:  Positive for hematuria.  All other systems reviewed and are negative.   Updated Vital Signs BP (!) 158/74   Pulse 90   Temp 98 F (36.7 C) (Oral)   Resp 16   Ht 5' 10 (1.778 m)   Wt 74.8 kg   SpO2 98%   BMI 23.66 kg/m   Physical Exam Vitals and nursing note reviewed.  Constitutional:      Appearance: He is well-developed.  HENT:     Head: Normocephalic.  Eyes:     Pupils: Pupils are equal, round, and reactive to light.  Cardiovascular:     Rate and Rhythm: Normal rate.  Pulmonary:     Effort: Pulmonary effort is normal.  Abdominal:     General: Abdomen is flat. There is no  distension.  Genitourinary:    Comments: Blood in Foley. Musculoskeletal:        General: Normal range of motion.     Cervical back: Normal range of motion.  Skin:    General: Skin is warm.  Neurological:     General: No focal deficit present.     Mental Status: He is alert and oriented to person, place, and time.     (all labs ordered are listed, but only abnormal results are displayed) Labs Reviewed  URINALYSIS, ROUTINE W REFLEX MICROSCOPIC - Abnormal; Notable for the following components:      Result Value   Color, Urine AMBER (*)    APPearance CLOUDY (*)    Glucose, UA 50 (*)    Hgb urine dipstick LARGE (*)    Protein, ur 100 (*)    Leukocytes,Ua MODERATE (*)    Bacteria, UA FEW (*)    All other components within normal limits  CBC WITH  DIFFERENTIAL/PLATELET - Abnormal; Notable for the following components:   Monocytes Absolute 1.3 (*)    Eosinophils Absolute 0.6 (*)    Abs Immature Granulocytes 0.14 (*)    All other components within normal limits  BASIC METABOLIC PANEL WITH GFR - Abnormal; Notable for the following components:   Glucose, Bld 166 (*)    BUN 34 (*)    Creatinine, Ser 1.67 (*)    GFR, Estimated 42 (*)    All other components within normal limits  URINE CULTURE    EKG: None  Radiology: No results found.   Procedures   Medications Ordered in the ED  cefTRIAXone  (ROCEPHIN ) 2 g in sodium chloride  0.9 % 100 mL IVPB (0 g Intravenous Stopped 06/21/24 1958)                                    Medical Decision Making Patient complains of bleeding in the Foley bag.  He is currently on Eliquis .  Amount and/or Complexity of Data Reviewed Labs: ordered. Decision-making details documented in ED Course.    Details: Labs ordered reviewed and interpreted.  UA shows greater than 50 white blood cells and greater than 50 red blood cells on the UA.  CBC shows a white blood cell count of 10.1 platelets are normal at 195.  Basic metabolic panel shows a glucose of 166 BUN is 34 creatinine is 1.67 with a GFR of 4.2.  Risk Risk Details: Patient given IV Rocephin  here in the emergency department.  He is given a prescription for Keflex .  Patient's previous urine cultures are reviewed and have shown Enterobacter that should be sensitive to Keflex .        Final diagnoses:  Gross hematuria  Acute cystitis with hematuria    ED Discharge Orders          Ordered    cephALEXin  (KEFLEX ) 500 MG capsule  4 times daily        06/21/24 2020            An After Visit Summary was printed and given to the patient.    Flint Sonny POUR, PA-C 06/21/24 2020  "

## 2024-06-21 NOTE — ED Notes (Signed)
 Pt's foley catheter is working appropriately and filling.  Contacted pt's wife to come pick up pt and will be here around 2140.

## 2024-06-22 LAB — URINE CULTURE
